# Patient Record
Sex: Male | Born: 1940 | Race: White | Hispanic: No | Smoking: Light tobacco smoker
Health system: Southern US, Community
[De-identification: ages and names within clinical notes are randomized; demographics above are authoritative.]

## PROBLEM LIST (undated history)

## (undated) DIAGNOSIS — K259 Gastric ulcer, unspecified as acute or chronic, without hemorrhage or perforation: Secondary | ICD-10-CM

## (undated) DIAGNOSIS — K219 Gastro-esophageal reflux disease without esophagitis: Secondary | ICD-10-CM

---

## 2020-06-27 ENCOUNTER — Emergency Department (HOSPITAL_COMMUNITY): Payer: Medicare Other

## 2020-06-27 ENCOUNTER — Other Ambulatory Visit: Payer: Self-pay

## 2020-06-27 ENCOUNTER — Inpatient Hospital Stay (HOSPITAL_COMMUNITY)
Admission: EM | Admit: 2020-06-27 | Discharge: 2020-07-12 | DRG: 326 | Disposition: A | Discharge status: Other Acute Inpatient Hospital | Payer: Medicare Other | Attending: Family Medicine | Admitting: Family Medicine

## 2020-06-27 DIAGNOSIS — I442 Atrioventricular block, complete: Secondary | ICD-10-CM

## 2020-06-27 DIAGNOSIS — Z20822 Contact with and (suspected) exposure to covid-19: Secondary | ICD-10-CM | POA: Diagnosis present

## 2020-06-27 DIAGNOSIS — B9681 Helicobacter pylori [H. pylori] as the cause of diseases classified elsewhere: Secondary | ICD-10-CM | POA: Diagnosis present

## 2020-06-27 DIAGNOSIS — E871 Hypo-osmolality and hyponatremia: Secondary | ICD-10-CM | POA: Diagnosis not present

## 2020-06-27 DIAGNOSIS — K3189 Other diseases of stomach and duodenum: Secondary | ICD-10-CM

## 2020-06-27 DIAGNOSIS — Z978 Presence of other specified devices: Secondary | ICD-10-CM

## 2020-06-27 DIAGNOSIS — T8143XA Infection following a procedure, organ and space surgical site, initial encounter: Secondary | ICD-10-CM

## 2020-06-27 DIAGNOSIS — K651 Peritoneal abscess: Secondary | ICD-10-CM | POA: Diagnosis not present

## 2020-06-27 DIAGNOSIS — I959 Hypotension, unspecified: Secondary | ICD-10-CM

## 2020-06-27 DIAGNOSIS — Z452 Encounter for adjustment and management of vascular access device: Secondary | ICD-10-CM

## 2020-06-27 DIAGNOSIS — J9601 Acute respiratory failure with hypoxia: Secondary | ICD-10-CM | POA: Diagnosis present

## 2020-06-27 DIAGNOSIS — I082 Rheumatic disorders of both aortic and tricuspid valves: Secondary | ICD-10-CM | POA: Diagnosis present

## 2020-06-27 DIAGNOSIS — R6521 Severe sepsis with septic shock: Secondary | ICD-10-CM | POA: Diagnosis not present

## 2020-06-27 DIAGNOSIS — N179 Acute kidney failure, unspecified: Secondary | ICD-10-CM

## 2020-06-27 DIAGNOSIS — E876 Hypokalemia: Secondary | ICD-10-CM | POA: Diagnosis not present

## 2020-06-27 DIAGNOSIS — R198 Other specified symptoms and signs involving the digestive system and abdomen: Secondary | ICD-10-CM

## 2020-06-27 DIAGNOSIS — E875 Hyperkalemia: Secondary | ICD-10-CM

## 2020-06-27 DIAGNOSIS — E869 Volume depletion, unspecified: Secondary | ICD-10-CM | POA: Diagnosis not present

## 2020-06-27 DIAGNOSIS — R0602 Shortness of breath: Secondary | ICD-10-CM

## 2020-06-27 DIAGNOSIS — J95821 Acute postprocedural respiratory failure: Secondary | ICD-10-CM | POA: Diagnosis not present

## 2020-06-27 DIAGNOSIS — K7200 Acute and subacute hepatic failure without coma: Secondary | ICD-10-CM | POA: Diagnosis not present

## 2020-06-27 DIAGNOSIS — K255 Chronic or unspecified gastric ulcer with perforation: Secondary | ICD-10-CM | POA: Diagnosis not present

## 2020-06-27 DIAGNOSIS — D631 Anemia in chronic kidney disease: Secondary | ICD-10-CM | POA: Diagnosis present

## 2020-06-27 DIAGNOSIS — I129 Hypertensive chronic kidney disease with stage 1 through stage 4 chronic kidney disease, or unspecified chronic kidney disease: Secondary | ICD-10-CM | POA: Diagnosis present

## 2020-06-27 DIAGNOSIS — B379 Candidiasis, unspecified: Secondary | ICD-10-CM | POA: Diagnosis present

## 2020-06-27 DIAGNOSIS — N184 Chronic kidney disease, stage 4 (severe): Secondary | ICD-10-CM | POA: Diagnosis present

## 2020-06-27 DIAGNOSIS — A419 Sepsis, unspecified organism: Secondary | ICD-10-CM | POA: Diagnosis not present

## 2020-06-27 DIAGNOSIS — F17200 Nicotine dependence, unspecified, uncomplicated: Secondary | ICD-10-CM | POA: Diagnosis present

## 2020-06-27 DIAGNOSIS — R188 Other ascites: Secondary | ICD-10-CM

## 2020-06-27 LAB — COMPREHENSIVE METABOLIC PANEL
ALT: 14 U/L (ref 0–44)
AST: 21 U/L (ref 15–41)
Albumin: 3.7 g/dL (ref 3.5–5.0)
Alkaline Phosphatase: 47 U/L (ref 38–126)
Anion gap: 13 (ref 5–15)
BUN: 59 mg/dL — ABNORMAL HIGH (ref 8–23)
CO2: 25 mmol/L (ref 22–32)
Calcium: 8.9 mg/dL (ref 8.9–10.3)
Chloride: 97 mmol/L — ABNORMAL LOW (ref 98–111)
Creatinine, Ser: 2.42 mg/dL — ABNORMAL HIGH (ref 0.61–1.24)
GFR, Estimated: 26 mL/min — ABNORMAL LOW (ref >60)
Glucose, Bld: 150 mg/dL — ABNORMAL HIGH (ref 70–99)
Potassium: 4.4 mmol/L (ref 3.5–5.1)
Sodium: 135 mmol/L (ref 135–145)
Total Bilirubin: 1.1 mg/dL (ref 0.3–1.2)
Total Protein: 7.2 g/dL (ref 6.5–8.1)

## 2020-06-27 LAB — CBC WITH DIFFERENTIAL/PLATELET
Abs Immature Granulocytes: 0.02 10*3/uL (ref 0.00–0.07)
Basophils Absolute: 0 10*3/uL (ref 0.0–0.1)
Basophils Relative: 0 %
Eosinophils Absolute: 0.1 10*3/uL (ref 0.0–0.5)
Eosinophils Relative: 1 %
HCT: 38.2 % — ABNORMAL LOW (ref 39.0–52.0)
Hemoglobin: 12.3 g/dL — ABNORMAL LOW (ref 13.0–17.0)
Immature Granulocytes: 0 %
Lymphocytes Relative: 6 %
Lymphs Abs: 0.6 10*3/uL — ABNORMAL LOW (ref 0.7–4.0)
MCH: 31.3 pg (ref 26.0–34.0)
MCHC: 32.2 g/dL (ref 30.0–36.0)
MCV: 97.2 fL (ref 80.0–100.0)
Monocytes Absolute: 0.4 10*3/uL (ref 0.1–1.0)
Monocytes Relative: 5 %
Neutro Abs: 7.7 10*3/uL (ref 1.7–7.7)
Neutrophils Relative %: 88 %
Platelets: 392 10*3/uL (ref 150–400)
RBC: 3.93 MIL/uL — ABNORMAL LOW (ref 4.22–5.81)
RDW: 14.6 % (ref 11.5–15.5)
WBC: 8.7 10*3/uL (ref 4.0–10.5)
nRBC: 0 % (ref 0.0–0.2)

## 2020-06-27 LAB — RESPIRATORY PANEL BY RT PCR (FLU A&B, COVID)
Influenza A by PCR: NEGATIVE
Influenza B by PCR: NEGATIVE
SARS Coronavirus 2 by RT PCR: NEGATIVE

## 2020-06-27 LAB — LIPASE, BLOOD: Lipase: 58 U/L — ABNORMAL HIGH (ref 11–51)

## 2020-06-27 MED ORDER — PIPERACILLIN-TAZOBACTAM 3.375 G IVPB 30 MIN
3.3750 g | Freq: Once | INTRAVENOUS | Status: AC
  Administered 2020-06-27: 3.375 g via INTRAVENOUS
  Filled 2020-06-27: qty 50

## 2020-06-27 MED ORDER — SODIUM CHLORIDE 0.9 % IV BOLUS
1000.0000 mL | Freq: Once | INTRAVENOUS | Status: AC
  Administered 2020-06-27: 1000 mL via INTRAVENOUS

## 2020-06-27 MED ORDER — CHLORHEXIDINE GLUCONATE CLOTH 2 % EX PADS
6.0000 | MEDICATED_PAD | Freq: Once | CUTANEOUS | Status: AC
  Administered 2020-06-28: 6 via TOPICAL

## 2020-06-27 MED ORDER — ONDANSETRON HCL 4 MG/2ML IJ SOLN
4.0000 mg | Freq: Once | INTRAMUSCULAR | Status: AC
  Administered 2020-06-27: 4 mg via INTRAVENOUS
  Filled 2020-06-27: qty 2

## 2020-06-27 MED ORDER — MORPHINE SULFATE (PF) 4 MG/ML IV SOLN
4.0000 mg | Freq: Once | INTRAVENOUS | Status: AC
  Administered 2020-06-27: 4 mg via INTRAVENOUS
  Filled 2020-06-27: qty 1

## 2020-06-27 MED ORDER — HYDROMORPHONE HCL 1 MG/ML IJ SOLN
1.0000 mg | Freq: Once | INTRAMUSCULAR | Status: AC
  Administered 2020-06-27: 1 mg via INTRAVENOUS
  Filled 2020-06-27: qty 1

## 2020-06-27 MED ORDER — SODIUM CHLORIDE 0.9 % IV SOLN
2.0000 g | INTRAVENOUS | Status: AC
  Administered 2020-06-28: 2 g via INTRAVENOUS
  Filled 2020-06-27: qty 2

## 2020-06-27 MED ORDER — SODIUM CHLORIDE 0.9 % IV BOLUS
500.0000 mL | Freq: Once | INTRAVENOUS | Status: AC
  Administered 2020-06-27: 500 mL via INTRAVENOUS

## 2020-06-27 NOTE — ED Provider Notes (Addendum)
[facility] EMERGENCY DEPARTMENT Provider Note   CSN: [id number] Arrival date & time: 06/27/20  1935     History Chief Complaint  Patient presents with  . Abdominal Pain    Frederick [name] is a 79 y.o. male.  Pt presents to the ED today with abdominal pain.  Pt said it started today after eating chocolate.  Pain comes in waves.  No other associated sx.  Pt has not seen a doctor in several years.        No past medical history on file.  There are no problems to display for this patient.      No family history on file.  Social History   Tobacco Use  . Smoking status: Not on file  Substance Use Topics  . Alcohol use: Not on file  . Drug use: Not on file    Home Medications Prior to Admission medications   Medication Sig Start Date End Date Taking? Authorizing Provider  acetaminophen (TYLENOL) 500 MG tablet Take 500 mg by mouth every 6 (six) hours as needed.   Yes [provider]    Allergies    Patient has no known allergies.  Review of Systems   Review of Systems  Gastrointestinal: Positive for abdominal pain.  All other systems reviewed and are negative.   Physical Exam Updated Vital Signs BP 99/65   Pulse 65   Temp 97.8 F (36.6 C) (Oral)   Resp 18   Ht 5\' 8"  (1.727 m)   Wt 63.5 kg   SpO2 96%   BMI 21.29 kg/m   Physical Exam Vitals and nursing note reviewed.  Constitutional:      Appearance: He is well-developed.  HENT:     Head: Normocephalic and atraumatic.     Mouth/Throat:     Mouth: Mucous membranes are moist.     Pharynx: Oropharynx is clear.  Eyes:     Extraocular Movements: Extraocular movements intact.     Pupils: Pupils are equal, round, and reactive to light.  Cardiovascular:     Rate and Rhythm: Normal rate and regular rhythm.  Pulmonary:     Effort: Pulmonary effort is normal.     Breath sounds: Normal breath sounds.  Abdominal:     General: Abdomen is flat. Bowel sounds are decreased.     Palpations: Abdomen  is soft.     Tenderness: There is generalized abdominal tenderness.  Skin:    General: Skin is warm.     Capillary Refill: Capillary refill takes less than 2 seconds.     Comments: Chronic skin changes BLE  Neurological:     General: No focal deficit present.     Mental Status: He is alert and oriented to person, place, and time.  Psychiatric:        Mood and Affect: Mood normal.        Behavior: Behavior normal.     ED Results / Procedures / Treatments   Labs (all labs ordered are listed, but only abnormal results are displayed) Labs Reviewed  CBC WITH DIFFERENTIAL/PLATELET - Abnormal; Notable for the following components:      Result Value   RBC 3.93 (*)    Hemoglobin 12.3 (*)    HCT 38.2 (*)    Lymphs Abs 0.6 (*)    All other components within normal limits  COMPREHENSIVE METABOLIC PANEL - Abnormal; Notable for the following components:   Chloride 97 (*)    Glucose, Bld 150 (*)    BUN 59 (*)  Creatinine, Ser 2.42 (*)    GFR, Estimated 26 (*)    All other components within normal limits  LIPASE, BLOOD - Abnormal; Notable for the following components:   Lipase 58 (*)    All other components within normal limits  RESPIRATORY PANEL BY RT PCR (FLU A&B, COVID)  URINALYSIS, ROUTINE W REFLEX MICROSCOPIC    EKG EKG Interpretation  Date/Time:  Monday June 27 2020 22:51:06 EDT Ventricular Rate:  74 PR Interval:    QRS Duration: 89 QT Interval:  423 QTC Calculation: 470 R Axis:   -43 Text Interpretation: Sinus or ectopic atrial rhythm Prolonged PR interval Inferior infarct, old Anterior infarct, old No old tracing to compare Confirmed by Isla Pence [phone]) on 06/27/2020 11:25:02 PM   Radiology CT ABDOMEN PELVIS WO CONTRAST  Result Date: 06/27/2020 CLINICAL DATA:  Abdominal pain EXAM: CT ABDOMEN AND PELVIS WITHOUT CONTRAST TECHNIQUE: Multidetector CT imaging of the abdomen and pelvis was performed following the standard protocol without IV contrast.  COMPARISON:  None. FINDINGS: Lower chest: Lung bases demonstrate mild emphysematous disease. No acute consolidation or effusion. Minimal subpleural fibrosis. Coronary vascular calcification. Hepatobiliary: No focal liver abnormality is seen. No gallstones, gallbladder wall thickening, or biliary dilatation. Pancreas: Unremarkable. No pancreatic ductal dilatation or surrounding inflammatory changes. Spleen: Normal in size without focal abnormality. Adrenals/Urinary Tract: Adrenal glands are normal. Kidneys show no hydronephrosis. Multiple low-attenuation lesions within both kidneys favored to represent cysts though incompletely characterized without contrast. Slightly thick-walled urinary bladder. Stomach/Bowel: Wall thickening at the pre pyloric region of the stomach with suspected focal defect/perforation in the wall, coronal series 5, image number 23. No small bowel dilatation. Moderate stool in the colon. Negative appendix. 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 Vascular/Lymphatic: Moderate aortic atherosclerosis without aneurysm. Ectatic common iliac vessels measuring 1.6 cm on the right and 1.8 cm on the left. No suspicious adenopathy. Reproductive: Slightly enlarged prostate with mass effect on the bladder Other: Moderate free air within the upper abdomen. Small amount of ascites within the abdomen and pelvis. Musculoskeletal: No acute or significant osseous findings. IMPRESSION: 1. Moderate free air in the upper abdomen with small amount of free fluid in the abdomen and pelvis consistent with hollow viscus perforation. Suspected source is the pre-pyloric region of the stomach where there is wall thickening and apparent defect, either representing perforated ulcer or perforated gastric mass. 2. 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 Slightly enlarged prostate. Bladder slightly thick walled which may be due to cystitis or chronic obstruction. Critical  Value/emergent results were called by telephone at the time of interpretation on 06/27/2020 at 9:44 pm to provider Yair Dusza , who verbally acknowledged these results. Aortic Atherosclerosis (ICD10-I70.0). Electronically Signed   By: Donavan Foil M.D.   On: 06/27/2020 21:44   DG Chest Portable 1 View  Result Date: 06/27/2020 CLINICAL DATA:  Abdomen pain EXAM: PORTABLE CHEST 1 VIEW COMPARISON:  CT 06/27/2020 FINDINGS: The free air noted on CT is not well seen radiographically. No focal consolidation or effusion. Normal cardiomediastinal silhouette with aortic atherosclerosis. No pneumothorax. IMPRESSION: No active disease. The free air noted on CT is not well seen radiographically. Electronically Signed   By: Donavan Foil M.D.   On: 06/27/2020 22:44    Procedures Procedures (including critical care time)  Medications Ordered in ED Medications  Chlorhexidine Gluconate Cloth 2 % PADS 6 each (has no administration in time range)  cefoTEtan (CEFOTAN) 2 g in sodium chloride 0.9 % 100 mL IVPB (has  no administration in time range)  sodium chloride 0.9 % bolus 500 mL (0 mLs Intravenous Stopped 06/27/20 2150)  morphine 4 MG/ML injection 4 mg (4 mg Intravenous Given 06/27/20 2013)  ondansetron (ZOFRAN) injection 4 mg (4 mg Intravenous Given 06/27/20 2013)  HYDROmorphone (DILAUDID) injection 1 mg (1 mg Intravenous Given 06/27/20 2103)  piperacillin-tazobactam (ZOSYN) IVPB 3.375 g (0 g Intravenous Stopped 06/27/20 2305)  sodium chloride 0.9 % bolus 1,000 mL (1,000 mLs Intravenous New Bag/Given 06/27/20 2305)    ED Course  I have reviewed the triage vital signs and the nursing notes.  Pertinent labs & imaging results that were available during my care of the patient were reviewed by me and considered in my medical decision making (see chart for details).    MDM Rules/Calculators/A&P                          Pt given a dose of Zosyn IV.  He was d/w Dr. Constance Haw (surgery) who will take patient  to the OR.  Covid negative.  She requested a NG to be placed, but pt unable to tolerate this procedure.  CRITICAL CARE Performed by: Isla Pence   Total critical care time: 30 minutes  Critical care time was exclusive of separately billable procedures and treating other patients.  Critical care was necessary to treat or prevent imminent or life-threatening deterioration.  Critical care was time spent personally by me on the following activities: development of treatment plan with patient and/or surrogate as well as nursing, discussions with consultants, evaluation of patient's response to treatment, examination of patient, obtaining history from patient or surrogate, ordering and performing treatments and interventions, ordering and review of laboratory studies, ordering and review of radiographic studies, pulse oximetry and re-evaluation of patient's condition.  Final Clinical Impression(s) / ED Diagnoses Final diagnoses:  Perforated abdominal viscus  AKI (acute kidney injury) [facility])    Rx / Lewis Orders ED Discharge Orders    None       Isla Pence, MD 06/27/20 5093    Isla Pence, MD 06/27/20 2325

## 2020-06-27 NOTE — Progress Notes (Addendum)
[facility]  Patient with perforated peptic ulcer and tenderness on exam and requiring pain medication per Dr. Gilford Raid. Hemodynamically has been stable. Does not see PCP, so has no medical issues documented. Renal insufficiency.   Received 1000L total so far.   Will need to go back to surgery given continued pain and tenderness. No beds / RN Staff to admit at [facility]. AC is working to see other bed options. Would have to stay in PACU and RN on Call in PACU has to work in AM .  Asked Dr. Gilford Raid to give another 1L bolus. NG placement. Zosyn for perforation.  AC as found out that there is a bed at [facility] and I called and Spoke with Dr. Johney Maine who is on call there. He stated that [facility] takes [facility] transfers but unsure of the beds. AC said [facility] was holding 4 earlier.   Will proceed with surgery and sort out bed situation after as no great options available at this time.    Curlene Labrum, MD [facility] [address] [place], [place] [address] [phone] (office)

## 2020-06-27 NOTE — ED Notes (Signed)
Attempted NG placement x2, pt unable to tolerate & will not follow directions, MD made aware

## 2020-06-27 NOTE — H&P (Signed)
[facility] History and Physical  Reason for Referral: Perforated peptic ulcer   Referring Physician: Dr. Gilford Raid   Chief Complaint    Abdominal Pain      Frederick [name] is a 79 y.o. male.  HPI: Frederick [name] is an 79 yo who does not seek medical attention and has no PCP and takes no medications. He came in with acute onset of abdominal pain in his epigastric area after eating chocolate earlier today. The pain comes in waves and is not associated with any vomiting.   No prior colonoscopy and no reported NSAID/ BC powder use.   History reviewed. No pertinent past medical history.  History reviewed. No pertinent surgical history.  History reviewed. No pertinent family history.  Social History   Tobacco Use   Smoking status: Light Tobacco Smoker  Substance Use Topics   Alcohol use: Not on file   Drug use: Not on file    Medications: I have revie the patient's current medications. Current Facility-Administered Medications  Medication Dose Route Frequency Provider Last Rate Last Admin   cefoTEtan (CEFOTAN) 2 g in sodium chloride 0.9 % 100 mL IVPB  2 g Intravenous On Call to OR Virl Cagey, MD       Current Outpatient Medications  Medication Sig Dispense Refill Last Dose   acetaminophen (TYLENOL) 500 MG tablet Take 500 mg by mouth every 6 (six) hours as needed.   06/27/2020 at Unknown time   No Known Allergies    ROS:  A comprehensive review of systems was negative except for: Gastrointestinal: positive for abdominal pain  Blood pressure 128/84, pulse (!) 56, temperature 97.8 F (36.6 C), temperature source Oral, resp. rate 18, height 5\' 8"  (1.727 m), weight 63.5 kg, SpO2 94 %. Physical Exam Vitals reviewed.  Constitutional:      General: He is in acute distress.     Appearance: He is underweight.  HENT:     Head: Normocephalic.  Eyes:     Extraocular Movements: Extraocular movements intact.  Cardiovascular:     Rate and Rhythm: Normal  rate.  Pulmonary:     Effort: Pulmonary effort is normal.  Abdominal:     General: There is distension.     Palpations: Abdomen is soft.     Tenderness: There is abdominal tenderness.     Hernia: No hernia is present.  Musculoskeletal:     Comments: Moves all extremities   Skin:    General: Skin is warm.  Neurological:     General: No focal deficit present.     Mental Status: He is alert and oriented to person, place, and time.  Psychiatric:        Mood and Affect: Mood normal.        Behavior: Behavior normal.     Results: Results for orders placed or performed during the hospital encounter of 06/27/20 (from the past 48 hour(s))  CBC with Differential     Status: Abnormal   Collection Time: 06/27/20  8:09 PM  Result Value Ref Range   WBC 8.7 4.0 - 10.5 K/uL   RBC 3.93 (L) 4.22 - 5.81 MIL/uL   Hemoglobin 12.3 (L) 13.0 - 17.0 g/dL   HCT 38.2 (L) 39 - 52 %   MCV 97.2 80.0 - 100.0 fL   MCH 31.3 26.0 - 34.0 pg   MCHC 32.2 30.0 - 36.0 g/dL   RDW 14.6 11.5 - 15.5 %   Platelets 392 150 - 400 K/uL   nRBC 0.0  0.0 - 0.2 %   Neutrophils Relative % 88 %   Neutro Abs 7.7 1.7 - 7.7 K/uL   Lymphocytes Relative 6 %   Lymphs Abs 0.6 (L) 0.7 - 4.0 K/uL   Monocytes Relative 5 %   Monocytes Absolute 0.4 0.1 - 1.0 K/uL   Eosinophils Relative 1 %   Eosinophils Absolute 0.1 0.0 - 0.5 K/uL   Basophils Relative 0 %   Basophils Absolute 0.0 0.0 - 0.1 K/uL   Immature Granulocytes 0 %   Abs Immature Granulocytes 0.02 0.00 - 0.07 K/uL    Comment: Performed at [facility], [address] [place], [place] [address]  Comprehensive metabolic panel     Status: Abnormal   Collection Time: 06/27/20  8:09 PM  Result Value Ref Range   Sodium 135 135 - 145 mmol/L   Potassium 4.4 3.5 - 5.1 mmol/L   Chloride 97 (L) 98 - 111 mmol/L   CO2 25 22 - 32 mmol/L   Glucose, Bld 150 (H) 70 - 99 mg/dL    Comment: Glucose reference range applies only to samples taken after fasting for at least 8 hours.   BUN  59 (H) 8 - 23 mg/dL   Creatinine, Ser 2.42 (H) 0.61 - 1.24 mg/dL   Calcium 8.9 8.9 - 10.3 mg/dL   Total Protein 7.2 6.5 - 8.1 g/dL   Albumin 3.7 3.5 - 5.0 g/dL   AST 21 15 - 41 U/L   ALT 14 0 - 44 U/L   Alkaline Phosphatase 47 38 - 126 U/L   Total Bilirubin 1.1 0.3 - 1.2 mg/dL   GFR, Estimated 26 (L) >60 mL/min    Comment: (NOTE) Calculated using the CKD-EPI Creatinine Equation (2021)    Anion gap 13 5 - 15    Comment: Performed at [facility], [address], [place], [place] [address]  Lipase, blood     Status: Abnormal   Collection Time: 06/27/20  8:09 PM  Result Value Ref Range   Lipase 58 (H) 11 - 51 U/L    Comment: Performed at [facility], [address], [place], [place] [address]  Prepare RBC (crossmatch)     Status: None   Collection Time: 06/27/20  8:09 PM  Result Value Ref Range   Order Confirmation      ORDER PROCESSED BY BLOOD BANK Performed at [facility], [address], [place], [place] [address]   Type and screen [facility]     Status: None (Preliminary result)   Collection Time: 06/27/20  8:09 PM  Result Value Ref Range   ABO/RH(D) A POS    Antibody Screen PENDING    Sample Expiration      06/30/2020,2359 Performed at [facility], [address] [place], [place] [address]   ABO/Rh     Status: None   Collection Time: 06/27/20  8:09 PM  Result Value Ref Range   ABO/RH(D)      A POS Performed at [facility], [address] [place], [place] [address]   Respiratory Panel by RT PCR (Flu A&B, Covid) - Nasopharyngeal Swab     Status: None   Collection Time: 06/27/20  9:48 PM   Specimen: Nasopharyngeal Swab  Result Value Ref Range   SARS Coronavirus 2 by RT PCR NEGATIVE NEGATIVE    Comment: (NOTE) SARS-CoV-2 target nucleic acids are NOT DETECTED.  The SARS-CoV-2 RNA is generally detectable in upper respiratoy specimens during the acute phase of infection. The lowest concentration of SARS-CoV-2  viral copies this assay can detect is 131  copies/mL. A negative result does not preclude SARS-Cov-2 infection and should not be used as the sole basis for treatment or other patient management decisions. A negative result may occur with  improper specimen collection/handling, submission of specimen other than nasopharyngeal swab, presence of viral mutation(s) within the areas targeted by this assay, and inadequate number of viral copies (<131 copies/mL). A negative result must be combined with clinical observations, patient history, and epidemiological information. The expected result is Negative.  Fact Sheet for Patients:  PinkCheek.be  Fact Sheet for Healthcare Providers:  GravelBags.it  This test is no t yet approved or cleared by the [place] FDA and  has been authorized for detection and/or diagnosis of SARS-CoV-2 by FDA under an Emergency Use Authorization (EUA). This EUA will remain  in effect (meaning this test can be used) for the duration of the COVID-19 declaration under Section 564(b)(1) of the Act, 21 U.S.C. section 360bbb-3(b)(1), unless the authorization is terminated or revoked sooner.     Influenza A by PCR NEGATIVE NEGATIVE   Influenza B by PCR NEGATIVE NEGATIVE    Comment: (NOTE) The Xpert Xpress SARS-CoV-2/FLU/RSV assay is intended as an aid in  the diagnosis of influenza from Nasopharyngeal swab specimens and  should not be used as a sole basis for treatment. Nasal washings and  aspirates are unacceptable for Xpert Xpress SARS-CoV-2/FLU/RSV  testing.  Fact Sheet for Patients: PinkCheek.be  Fact Sheet for Healthcare Providers: GravelBags.it  This test is not yet approved or cleared by the [place] FDA and  has been authorized for detection and/or diagnosis of SARS-CoV-2 by  FDA under an Emergency Use Authorization (EUA). This EUA will remain  in effect (meaning this test  can be used) for the duration of the  Covid-19 declaration under Section 564(b)(1) of the Act, 21  U.S.C. section 360bbb-3(b)(1), unless the authorization is  terminated or revoked. Performed at [facility], [address], [place], [place] [address]    Personally reviewed- free air around stomach, thickened stomach, coronal with possible area of perforation on anterior stomach  CT ABDOMEN PELVIS WO CONTRAST  Result Date: 06/27/2020 CLINICAL DATA:  Abdominal pain EXAM: CT ABDOMEN AND PELVIS WITHOUT CONTRAST TECHNIQUE: Multidetector CT imaging of the abdomen and pelvis was performed following the standard protocol without IV contrast. COMPARISON:  None. FINDINGS: Lower chest: Lung bases demonstrate mild emphysematous disease. No acute consolidation or effusion. Minimal subpleural fibrosis. Coronary vascular calcification. Hepatobiliary: No focal liver abnormality is seen. No gallstones, gallbladder wall thickening, or biliary dilatation. Pancreas: Unremarkable. No pancreatic ductal dilatation or surrounding inflammatory changes. Spleen: Normal in size without focal abnormality. Adrenals/Urinary Tract: Adrenal glands are normal. Kidneys show no hydronephrosis. Multiple low-attenuation lesions within both kidneys favored to represent cysts though incompletely characterized without contrast. Slightly thick-walled urinary bladder. Stomach/Bowel: Wall thickening at the pre pyloric region of the stomach with suspected focal defect/perforation in the wall, coronal series 5, image number 23. No small bowel dilatation. Moderate stool in the colon. Negative appendix. 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 Vascular/Lymphatic: Moderate aortic atherosclerosis without aneurysm. Ectatic common iliac vessels measuring 1.6 cm on the right and 1.8 cm on the left. No suspicious adenopathy. Reproductive: Slightly enlarged prostate with mass effect on the bladder Other: Moderate free air within the  upper abdomen. Small amount of ascites within the abdomen and pelvis. Musculoskeletal: No acute or significant osseous findings. IMPRESSION: 1. Moderate free air in the upper abdomen with  small amount of free fluid in the abdomen and pelvis consistent with hollow viscus perforation. Suspected source is the pre-pyloric region of the stomach where there is wall thickening and apparent defect, either representing perforated ulcer or perforated gastric mass. 2. 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 Slightly enlarged prostate. Bladder slightly thick walled which may be due to cystitis or chronic obstruction. Critical Value/emergent results were called by telephone at the time of interpretation on 06/27/2020 at 9:44 pm to provider JULIE HAVILAND , who verbally acknowledged these results. Aortic Atherosclerosis (ICD10-I70.0). Electronically Signed   By: Donavan Foil M.D.   On: 06/27/2020 21:44   DG Chest Portable 1 View  Result Date: 06/27/2020 CLINICAL DATA:  Abdomen pain EXAM: PORTABLE CHEST 1 VIEW COMPARISON:  CT 06/27/2020 FINDINGS: The free air noted on CT is not well seen radiographically. No focal consolidation or effusion. Normal cardiomediastinal silhouette with aortic atherosclerosis. No pneumothorax. IMPRESSION: No active disease. The free air noted on CT is not well seen radiographically. Electronically Signed   By: Donavan Foil M.D.   On: 06/27/2020 22:44     Assessment & Plan:  Frederick [name] is a 79 y.o. male with perforated bowel likely from gastric perforation from ulcer versus mass. Discussed with patient exploration and possible bowel resection or graham patch. Discussed risk of bleeding, infection, finding cancer. Discussed prolonged ICU course, intubation and need for family to make decision. He has 7 daughters were out of state, and says that his neighbor Frederick [name] is his contact, [phone]. She has his daughters information.   COVID negative OR for  exploration Called and spoke with Suanne Marker to notify her of OR and to explain that patient has potential to get very sick and need prolonged ICU stay. Suanne Marker says he had pain in the last few weeks and that he was having worsening pain today. Suanne Marker says that he has a girlfriend and gets meals on wheels. She has not really noticed any major weight loss recently.    All questions were answered to the satisfaction of the patient.  Virl Cagey 06/28/2020, 12:59 AM

## 2020-06-27 NOTE — ED Triage Notes (Signed)
Pt come sin from home via [facility] for complaints of abdominal pain that started after eating 2 pieces of chocolate. Pt complains of generalized abdominal pain "that comes in waves."  Does not have a PCP and does not go to the hospital. So he is unsure of any medical problems he may have.  He also has cellulitis in each lower leg that has been untreated per EMS.   EMS gave 161mcg Fentanyl  18g R AC

## 2020-06-28 ENCOUNTER — Inpatient Hospital Stay (HOSPITAL_COMMUNITY): Payer: Medicare Other

## 2020-06-28 ENCOUNTER — Emergency Department (HOSPITAL_COMMUNITY): Payer: Medicare Other | Admitting: Anesthesiology

## 2020-06-28 ENCOUNTER — Encounter (HOSPITAL_COMMUNITY)
Admission: EM | Disposition: A | Discharge status: Nursing Home (Includes ICF) | Payer: Self-pay | Source: Home / Self Care | Attending: Family Medicine

## 2020-06-28 ENCOUNTER — Encounter (HOSPITAL_COMMUNITY): Payer: Self-pay | Admitting: General Surgery

## 2020-06-28 DIAGNOSIS — R198 Other specified symptoms and signs involving the digestive system and abdomen: Secondary | ICD-10-CM | POA: Diagnosis not present

## 2020-06-28 DIAGNOSIS — Z20822 Contact with and (suspected) exposure to covid-19: Secondary | ICD-10-CM | POA: Diagnosis present

## 2020-06-28 DIAGNOSIS — F17200 Nicotine dependence, unspecified, uncomplicated: Secondary | ICD-10-CM | POA: Diagnosis present

## 2020-06-28 DIAGNOSIS — I442 Atrioventricular block, complete: Secondary | ICD-10-CM | POA: Diagnosis not present

## 2020-06-28 DIAGNOSIS — K651 Peritoneal abscess: Secondary | ICD-10-CM | POA: Diagnosis not present

## 2020-06-28 DIAGNOSIS — K7200 Acute and subacute hepatic failure without coma: Secondary | ICD-10-CM | POA: Diagnosis not present

## 2020-06-28 DIAGNOSIS — E875 Hyperkalemia: Secondary | ICD-10-CM | POA: Diagnosis not present

## 2020-06-28 DIAGNOSIS — D631 Anemia in chronic kidney disease: Secondary | ICD-10-CM | POA: Diagnosis present

## 2020-06-28 DIAGNOSIS — N179 Acute kidney failure, unspecified: Secondary | ICD-10-CM

## 2020-06-28 DIAGNOSIS — I129 Hypertensive chronic kidney disease with stage 1 through stage 4 chronic kidney disease, or unspecified chronic kidney disease: Secondary | ICD-10-CM | POA: Diagnosis present

## 2020-06-28 DIAGNOSIS — K255 Chronic or unspecified gastric ulcer with perforation: Secondary | ICD-10-CM | POA: Diagnosis present

## 2020-06-28 DIAGNOSIS — A419 Sepsis, unspecified organism: Secondary | ICD-10-CM | POA: Diagnosis not present

## 2020-06-28 DIAGNOSIS — E871 Hypo-osmolality and hyponatremia: Secondary | ICD-10-CM | POA: Diagnosis not present

## 2020-06-28 DIAGNOSIS — J95821 Acute postprocedural respiratory failure: Secondary | ICD-10-CM | POA: Diagnosis not present

## 2020-06-28 DIAGNOSIS — K3189 Other diseases of stomach and duodenum: Secondary | ICD-10-CM

## 2020-06-28 DIAGNOSIS — B9681 Helicobacter pylori [H. pylori] as the cause of diseases classified elsewhere: Secondary | ICD-10-CM | POA: Diagnosis present

## 2020-06-28 DIAGNOSIS — N184 Chronic kidney disease, stage 4 (severe): Secondary | ICD-10-CM | POA: Diagnosis present

## 2020-06-28 DIAGNOSIS — R6521 Severe sepsis with septic shock: Secondary | ICD-10-CM | POA: Diagnosis not present

## 2020-06-28 DIAGNOSIS — I959 Hypotension, unspecified: Secondary | ICD-10-CM

## 2020-06-28 DIAGNOSIS — N19 Unspecified kidney failure: Secondary | ICD-10-CM

## 2020-06-28 DIAGNOSIS — R0600 Dyspnea, unspecified: Secondary | ICD-10-CM | POA: Diagnosis not present

## 2020-06-28 DIAGNOSIS — E869 Volume depletion, unspecified: Secondary | ICD-10-CM | POA: Diagnosis not present

## 2020-06-28 DIAGNOSIS — B379 Candidiasis, unspecified: Secondary | ICD-10-CM | POA: Diagnosis present

## 2020-06-28 DIAGNOSIS — I082 Rheumatic disorders of both aortic and tricuspid valves: Secondary | ICD-10-CM | POA: Diagnosis present

## 2020-06-28 DIAGNOSIS — E876 Hypokalemia: Secondary | ICD-10-CM | POA: Diagnosis not present

## 2020-06-28 DIAGNOSIS — J9601 Acute respiratory failure with hypoxia: Secondary | ICD-10-CM | POA: Diagnosis present

## 2020-06-28 HISTORY — PX: LAPAROTOMY: SHX154

## 2020-06-28 LAB — BASIC METABOLIC PANEL
Anion gap: 9 (ref 5–15)
Anion gap: 5 (ref 5–15)
Anion gap: 13 (ref 5–15)
BUN: 56 mg/dL — ABNORMAL HIGH (ref 8–23)
BUN: 57 mg/dL — ABNORMAL HIGH (ref 8–23)
BUN: 58 mg/dL — ABNORMAL HIGH (ref 8–23)
CO2: 21 mmol/L — ABNORMAL LOW (ref 22–32)
CO2: 21 mmol/L — ABNORMAL LOW (ref 22–32)
CO2: 21 mmol/L — ABNORMAL LOW (ref 22–32)
Calcium: 7.7 mg/dL — ABNORMAL LOW (ref 8.9–10.3)
Calcium: 7.2 mg/dL — ABNORMAL LOW (ref 8.9–10.3)
Calcium: 7.7 mg/dL — ABNORMAL LOW (ref 8.9–10.3)
Chloride: 104 mmol/L (ref 98–111)
Chloride: 106 mmol/L (ref 98–111)
Chloride: 101 mmol/L (ref 98–111)
Creatinine, Ser: 2.52 mg/dL — ABNORMAL HIGH (ref 0.61–1.24)
Creatinine, Ser: 2.72 mg/dL — ABNORMAL HIGH (ref 0.61–1.24)
Creatinine, Ser: 2.99 mg/dL — ABNORMAL HIGH (ref 0.61–1.24)
GFR, Estimated: 25 mL/min — ABNORMAL LOW (ref >60)
GFR, Estimated: 23 mL/min — ABNORMAL LOW (ref >60)
GFR, Estimated: 20 mL/min — ABNORMAL LOW (ref >60)
Glucose, Bld: 107 mg/dL — ABNORMAL HIGH (ref 70–99)
Glucose, Bld: 106 mg/dL — ABNORMAL HIGH (ref 70–99)
Glucose, Bld: 96 mg/dL (ref 70–99)
Potassium: 5.2 mmol/L — ABNORMAL HIGH (ref 3.5–5.1)
Potassium: 5.3 mmol/L — ABNORMAL HIGH (ref 3.5–5.1)
Potassium: 5.6 mmol/L — ABNORMAL HIGH (ref 3.5–5.1)
Sodium: 134 mmol/L — ABNORMAL LOW (ref 135–145)
Sodium: 132 mmol/L — ABNORMAL LOW (ref 135–145)
Sodium: 135 mmol/L (ref 135–145)

## 2020-06-28 LAB — URINALYSIS, COMPLETE (UACMP) WITH MICROSCOPIC
Bilirubin Urine: NEGATIVE
Glucose, UA: NEGATIVE mg/dL
Hgb urine dipstick: NEGATIVE
Ketones, ur: NEGATIVE mg/dL
Leukocytes,Ua: NEGATIVE
Nitrite: NEGATIVE
Protein, ur: 30 mg/dL — AB
Specific Gravity, Urine: 1.025 (ref 1.005–1.030)
pH: 5 (ref 5.0–8.0)

## 2020-06-28 LAB — BLOOD GAS, ARTERIAL
Acid-base deficit: 2.4 mmol/L — ABNORMAL HIGH (ref 0.0–2.0)
Bicarbonate: 22 mmol/L (ref 20.0–28.0)
FIO2: 50
O2 Saturation: 98.7 %
Patient temperature: 37
pCO2 arterial: 47.1 mmHg (ref 32.0–48.0)
pH, Arterial: 7.308 — ABNORMAL LOW (ref 7.350–7.450)
pO2, Arterial: 191 mmHg — ABNORMAL HIGH (ref 83.0–108.0)

## 2020-06-28 LAB — ABO/RH: ABO/RH(D): A POS

## 2020-06-28 LAB — PREPARE RBC (CROSSMATCH)

## 2020-06-28 LAB — CBC WITH DIFFERENTIAL/PLATELET
Band Neutrophils: 12 %
Basophils Absolute: 0 10*3/uL (ref 0.0–0.1)
Basophils Relative: 0 %
Eosinophils Absolute: 0 10*3/uL (ref 0.0–0.5)
Eosinophils Relative: 0 %
HCT: 38.2 % — ABNORMAL LOW (ref 39.0–52.0)
Hemoglobin: 11.6 g/dL — ABNORMAL LOW (ref 13.0–17.0)
Lymphocytes Relative: 15 %
Lymphs Abs: 0.6 10*3/uL — ABNORMAL LOW (ref 0.7–4.0)
MCH: 31.4 pg (ref 26.0–34.0)
MCHC: 30.4 g/dL (ref 30.0–36.0)
MCV: 103.5 fL — ABNORMAL HIGH (ref 80.0–100.0)
Metamyelocytes Relative: 19 %
Monocytes Absolute: 0.1 10*3/uL (ref 0.1–1.0)
Monocytes Relative: 2 %
Myelocytes: 4 %
Neutro Abs: 2.5 10*3/uL (ref 1.7–7.7)
Neutrophils Relative %: 47 %
Platelets: 316 10*3/uL (ref 150–400)
Promyelocytes Relative: 1 %
RBC: 3.69 MIL/uL — ABNORMAL LOW (ref 4.22–5.81)
RDW: 14.8 % (ref 11.5–15.5)
WBC: 4.3 10*3/uL (ref 4.0–10.5)
nRBC: 0 % (ref 0.0–0.2)

## 2020-06-28 LAB — GLUCOSE, CAPILLARY
Glucose-Capillary: 89 mg/dL (ref 70–99)
Glucose-Capillary: 110 mg/dL — ABNORMAL HIGH (ref 70–99)
Glucose-Capillary: 90 mg/dL (ref 70–99)
Glucose-Capillary: 97 mg/dL (ref 70–99)
Glucose-Capillary: 84 mg/dL (ref 70–99)

## 2020-06-28 LAB — T4, FREE: Free T4: 0.96 ng/dL (ref 0.61–1.12)

## 2020-06-28 LAB — LACTIC ACID, PLASMA
Lactic Acid, Venous: 3.1 mmol/L (ref 0.5–1.9)
Lactic Acid, Venous: 2.3 mmol/L (ref 0.5–1.9)

## 2020-06-28 LAB — TROPONIN I (HIGH SENSITIVITY)
Troponin I (High Sensitivity): 18 ng/L — ABNORMAL HIGH
Troponin I (High Sensitivity): 21 ng/L — ABNORMAL HIGH (ref <18)

## 2020-06-28 LAB — PHOSPHORUS: Phosphorus: 5.1 mg/dL — ABNORMAL HIGH (ref 2.5–4.6)

## 2020-06-28 LAB — TSH: TSH: 3.572 u[IU]/mL (ref 0.350–4.500)

## 2020-06-28 LAB — MAGNESIUM: Magnesium: 2.5 mg/dL — ABNORMAL HIGH (ref 1.7–2.4)

## 2020-06-28 LAB — TRIGLYCERIDES: Triglycerides: 48 mg/dL (ref <150)

## 2020-06-28 LAB — PROCALCITONIN: Procalcitonin: 58 ng/mL

## 2020-06-28 SURGERY — LAPAROTOMY, EXPLORATORY
Anesthesia: General

## 2020-06-28 MED ORDER — DEXTROSE 50 % IV SOLN
1.0000 | Freq: Once | INTRAVENOUS | Status: AC
  Administered 2020-06-28: 50 mL via INTRAVENOUS
  Filled 2020-06-28: qty 50

## 2020-06-28 MED ORDER — SODIUM CHLORIDE FLUSH 0.9 % IV SOLN
INTRAVENOUS | Status: AC
  Filled 2020-06-28: qty 10

## 2020-06-28 MED ORDER — HYDROMORPHONE HCL 1 MG/ML IJ SOLN: 0.2500 mg | INTRAMUSCULAR | Status: DC | PRN

## 2020-06-28 MED ORDER — PIPERACILLIN-TAZOBACTAM 3.375 G IVPB
3.3750 g | Freq: Two times a day (BID) | INTRAVENOUS | Status: DC
  Administered 2020-06-28: 3.375 g via INTRAVENOUS
  Filled 2020-06-28: qty 50

## 2020-06-28 MED ORDER — CALCIUM GLUCONATE-NACL 1-0.675 GM/50ML-% IV SOLN
1.0000 g | Freq: Once | INTRAVENOUS | Status: AC
  Administered 2020-06-28: 1000 mg via INTRAVENOUS
  Filled 2020-06-28: qty 50

## 2020-06-28 MED ORDER — PROPOFOL 10 MG/ML IV BOLUS
INTRAVENOUS | Status: DC | PRN
  Administered 2020-06-28: 80 mg via INTRAVENOUS

## 2020-06-28 MED ORDER — SODIUM BICARBONATE 8.4 % IV SOLN
100.0000 meq | Freq: Once | INTRAVENOUS | Status: AC
  Administered 2020-06-28: 100 meq via INTRAVENOUS

## 2020-06-28 MED ORDER — SODIUM CHLORIDE 0.9 % IV SOLN: INTRAVENOUS | Status: DC

## 2020-06-28 MED ORDER — DIPHENHYDRAMINE HCL 50 MG/ML IJ SOLN
12.5000 mg | Freq: Four times a day (QID) | INTRAMUSCULAR | Status: DC | PRN
  Administered 2020-06-30: 12.5 mg via INTRAVENOUS
  Filled 2020-06-28: qty 1

## 2020-06-28 MED ORDER — INSULIN ASPART 100 UNIT/ML IV SOLN
10.0000 [IU] | Freq: Once | INTRAVENOUS | Status: AC
  Administered 2020-06-28: 10 [IU] via INTRAVENOUS

## 2020-06-28 MED ORDER — PROPOFOL 10 MG/ML IV BOLUS
INTRAVENOUS | Status: AC
  Filled 2020-06-28: qty 20

## 2020-06-28 MED ORDER — CHLORHEXIDINE GLUCONATE 0.12% ORAL RINSE (MEDLINE KIT)
15.0000 mL | Freq: Two times a day (BID) | OROMUCOSAL | Status: DC
  Administered 2020-06-28 – 2020-07-12 (×19): 15 mL via OROMUCOSAL

## 2020-06-28 MED ORDER — SODIUM BICARBONATE 8.4 % IV SOLN
INTRAVENOUS | Status: AC
  Filled 2020-06-28: qty 50

## 2020-06-28 MED ORDER — ONDANSETRON HCL 4 MG/2ML IJ SOLN: 4.0000 mg | Freq: Four times a day (QID) | INTRAMUSCULAR | Status: DC | PRN

## 2020-06-28 MED ORDER — LACTATED RINGERS IV SOLN: Freq: Once | INTRAVENOUS | Status: AC

## 2020-06-28 MED ORDER — FENTANYL CITRATE (PF) 250 MCG/5ML IJ SOLN
INTRAMUSCULAR | Status: AC
  Filled 2020-06-28: qty 5

## 2020-06-28 MED ORDER — ORAL CARE MOUTH RINSE
15.0000 mL | OROMUCOSAL | Status: DC
  Administered 2020-06-28 (×5): 15 mL via OROMUCOSAL

## 2020-06-28 MED ORDER — ONDANSETRON HCL 4 MG/2ML IJ SOLN: 4.0000 mg | Freq: Once | INTRAMUSCULAR | Status: DC | PRN

## 2020-06-28 MED ORDER — METOCLOPRAMIDE HCL 5 MG/ML IJ SOLN
INTRAMUSCULAR | Status: AC
  Filled 2020-06-28: qty 2

## 2020-06-28 MED ORDER — PIPERACILLIN-TAZOBACTAM 3.375 G IVPB
3.3750 g | Freq: Three times a day (TID) | INTRAVENOUS | Status: DC
  Administered 2020-06-28 (×2): 3.375 g via INTRAVENOUS
  Filled 2020-06-28 (×2): qty 50

## 2020-06-28 MED ORDER — HEPARIN SODIUM (PORCINE) 5000 UNIT/ML IJ SOLN
5000.0000 [IU] | Freq: Three times a day (TID) | INTRAMUSCULAR | Status: AC
  Administered 2020-06-28 – 2020-07-06 (×25): 5000 [IU] via SUBCUTANEOUS
  Filled 2020-06-28 (×26): qty 1

## 2020-06-28 MED ORDER — LACTATED RINGERS IV SOLN: INTRAVENOUS | Status: DC | PRN

## 2020-06-28 MED ORDER — ORAL CARE MOUTH RINSE: 15.0000 mL | Freq: Once | OROMUCOSAL | Status: DC

## 2020-06-28 MED ORDER — HEPARIN SODIUM (PORCINE) 5000 UNIT/ML IJ SOLN: 5000.0000 [IU] | Freq: Three times a day (TID) | INTRAMUSCULAR | Status: DC

## 2020-06-28 MED ORDER — ONDANSETRON HCL 4 MG/2ML IJ SOLN
INTRAMUSCULAR | Status: AC
  Filled 2020-06-28: qty 2

## 2020-06-28 MED ORDER — LACTATED RINGERS IV BOLUS
1000.0000 mL | Freq: Once | INTRAVENOUS | Status: AC
  Administered 2020-06-28: 1000 mL via INTRAVENOUS

## 2020-06-28 MED ORDER — EPHEDRINE SULFATE-NACL 50-0.9 MG/10ML-% IV SOSY
PREFILLED_SYRINGE | INTRAVENOUS | Status: DC | PRN
  Administered 2020-06-28: 5 mg via INTRAVENOUS
  Administered 2020-06-28: 10 mg via INTRAVENOUS

## 2020-06-28 MED ORDER — SODIUM CHLORIDE 0.9 % IV SOLN: INTRAVENOUS | Status: DC | PRN

## 2020-06-28 MED ORDER — CHLORHEXIDINE GLUCONATE CLOTH 2 % EX PADS
6.0000 | MEDICATED_PAD | Freq: Every day | CUTANEOUS | Status: DC
  Administered 2020-06-28 – 2020-07-11 (×14): 6 via TOPICAL

## 2020-06-28 MED ORDER — ORAL CARE MOUTH RINSE
15.0000 mL | Freq: Two times a day (BID) | OROMUCOSAL | Status: DC
  Administered 2020-06-28 – 2020-07-12 (×27): 15 mL via OROMUCOSAL

## 2020-06-28 MED ORDER — MORPHINE SULFATE (PF) 2 MG/ML IV SOLN
2.0000 mg | INTRAVENOUS | Status: DC | PRN
  Administered 2020-06-28 – 2020-07-01 (×12): 2 mg via INTRAVENOUS
  Filled 2020-06-28 (×12): qty 1

## 2020-06-28 MED ORDER — SODIUM ZIRCONIUM CYCLOSILICATE 10 G PO PACK
10.0000 g | PACK | Freq: Once | ORAL | Status: AC
  Administered 2020-06-28: 10 g
  Filled 2020-06-28: qty 1

## 2020-06-28 MED ORDER — PHENYLEPHRINE 40 MCG/ML (10ML) SYRINGE FOR IV PUSH (FOR BLOOD PRESSURE SUPPORT)
PREFILLED_SYRINGE | INTRAVENOUS | Status: DC | PRN
  Administered 2020-06-28: 80 ug via INTRAVENOUS
  Administered 2020-06-28: 40 ug via INTRAVENOUS
  Administered 2020-06-28: 80 ug via INTRAVENOUS
  Administered 2020-06-28: 40 ug via INTRAVENOUS

## 2020-06-28 MED ORDER — LACTATED RINGERS IV SOLN: INTRAVENOUS | Status: DC

## 2020-06-28 MED ORDER — ONDANSETRON 4 MG PO TBDP: 4.0000 mg | ORAL_TABLET | Freq: Four times a day (QID) | ORAL | Status: DC | PRN

## 2020-06-28 MED ORDER — SODIUM CHLORIDE 0.9 % IR SOLN
Status: DC | PRN
  Administered 2020-06-28: 1
  Administered 2020-06-28: 1000 mL
  Administered 2020-06-28: 1

## 2020-06-28 MED ORDER — PANTOPRAZOLE SODIUM 40 MG IV SOLR
40.0000 mg | Freq: Two times a day (BID) | INTRAVENOUS | Status: DC
  Administered 2020-06-28 – 2020-07-01 (×7): 40 mg via INTRAVENOUS
  Filled 2020-06-28 (×7): qty 40

## 2020-06-28 MED ORDER — SODIUM CHLORIDE 0.9 % IV BOLUS
500.0000 mL | Freq: Once | INTRAVENOUS | Status: AC
  Administered 2020-06-28: 500 mL via INTRAVENOUS

## 2020-06-28 MED ORDER — METOPROLOL TARTRATE 5 MG/5ML IV SOLN: 5.0000 mg | Freq: Four times a day (QID) | INTRAVENOUS | Status: DC | PRN

## 2020-06-28 MED ORDER — FENTANYL CITRATE (PF) 100 MCG/2ML IJ SOLN
INTRAMUSCULAR | Status: DC | PRN
  Administered 2020-06-28 (×5): 50 ug via INTRAVENOUS

## 2020-06-28 MED ORDER — ROCURONIUM BROMIDE 100 MG/10ML IV SOLN
INTRAVENOUS | Status: DC | PRN
  Administered 2020-06-28: 60 mg via INTRAVENOUS
  Administered 2020-06-28: 40 mg via INTRAVENOUS

## 2020-06-28 MED ORDER — CHLORHEXIDINE GLUCONATE 0.12 % MT SOLN: 15.0000 mL | Freq: Once | OROMUCOSAL | Status: DC

## 2020-06-28 MED ORDER — PROPOFOL 1000 MG/100ML IV EMUL
5.0000 ug/kg/min | INTRAVENOUS | Status: DC
  Administered 2020-06-28: 5 ug/kg/min via INTRAVENOUS
  Filled 2020-06-28: qty 100

## 2020-06-28 MED ORDER — DIPHENHYDRAMINE HCL 12.5 MG/5ML PO ELIX
12.5000 mg | ORAL_SOLUTION | Freq: Four times a day (QID) | ORAL | Status: DC | PRN
  Administered 2020-07-07: 12.5 mg via ORAL
  Filled 2020-06-28: qty 5

## 2020-06-28 MED ORDER — SODIUM CHLORIDE 0.9 % IV BOLUS
1000.0000 mL | Freq: Once | INTRAVENOUS | Status: AC
  Administered 2020-06-28: 1000 mL via INTRAVENOUS

## 2020-06-28 MED ORDER — DOPAMINE-DEXTROSE 3.2-5 MG/ML-% IV SOLN
0.0000 ug/kg/min | INTRAVENOUS | Status: DC
  Administered 2020-06-28: 5 ug/kg/min via INTRAVENOUS
  Filled 2020-06-28: qty 250

## 2020-06-28 SURGICAL SUPPLY — 63 items
APPLIER CLIP 11 MED OPEN (CLIP)
APPLIER CLIP 13 LRG OPEN (CLIP)
BARRIER SKIN 2 3/4 (OSTOMY) IMPLANT
BARRIER SKIN 2 3/4 INCH (OSTOMY)
CELLS DAT CNTRL 66122 CELL SVR (MISCELLANEOUS) ×1 IMPLANT
CHLORAPREP W/TINT 26 (MISCELLANEOUS) ×6 IMPLANT
CLAMP POUCH DRAINAGE QUIET (OSTOMY) IMPLANT
CLIP APPLIE 11 MED OPEN (CLIP) IMPLANT
CLIP APPLIE 13 LRG OPEN (CLIP) IMPLANT
CLOTH BEACON ORANGE TIMEOUT ST (SAFETY) ×3 IMPLANT
COVER LIGHT HANDLE STERIS (MISCELLANEOUS) ×6 IMPLANT
COVER WAND RF STERILE (DRAPES) ×3 IMPLANT
DRAPE WARM FLUID 44X44 (DRAPES) ×3 IMPLANT
DRSG OPSITE POSTOP 4X10 (GAUZE/BANDAGES/DRESSINGS) IMPLANT
DRSG OPSITE POSTOP 4X8 (GAUZE/BANDAGES/DRESSINGS) ×3 IMPLANT
ELECT BLADE 6 FLAT ULTRCLN (ELECTRODE) ×3 IMPLANT
ELECT REM PT RETURN 9FT ADLT (ELECTROSURGICAL) ×3
ELECTRODE REM PT RTRN 9FT ADLT (ELECTROSURGICAL) ×1 IMPLANT
GLOVE BIO SURGEON STRL SZ 6.5 (GLOVE) ×4 IMPLANT
GLOVE BIO SURGEONS STRL SZ 6.5 (GLOVE) ×2
GLOVE BIOGEL PI IND STRL 6.5 (GLOVE) ×2 IMPLANT
GLOVE BIOGEL PI IND STRL 7.0 (GLOVE) ×2 IMPLANT
GLOVE BIOGEL PI INDICATOR 6.5 (GLOVE) ×4
GLOVE BIOGEL PI INDICATOR 7.0 (GLOVE) ×4
GLOVE SURG SS PI 7.5 STRL IVOR (GLOVE) ×3 IMPLANT
GOWN STRL REUS W/TWL LRG LVL3 (GOWN DISPOSABLE) ×9 IMPLANT
HANDLE SUCTION POOLE (INSTRUMENTS) ×1 IMPLANT
INST SET MAJOR GENERAL (KITS) ×3 IMPLANT
KIT REMOVER STAPLE SKIN (MISCELLANEOUS) IMPLANT
KIT TURNOVER KIT A (KITS) ×3 IMPLANT
LIGASURE IMPACT 36 18CM CVD LR (INSTRUMENTS) ×3 IMPLANT
MANIFOLD NEPTUNE II (INSTRUMENTS) ×3 IMPLANT
NEEDLE HYPO 18GX1.5 BLUNT FILL (NEEDLE) ×3 IMPLANT
NEEDLE HYPO 21X1.5 SAFETY (NEEDLE) ×3 IMPLANT
NS IRRIG 1000ML POUR BTL (IV SOLUTION) ×12 IMPLANT
PACK ABDOMINAL MAJOR (CUSTOM PROCEDURE TRAY) ×3 IMPLANT
PAD ARMBOARD 7.5X6 YLW CONV (MISCELLANEOUS) ×3 IMPLANT
PENCIL SMOKE EVACUATOR (MISCELLANEOUS) ×3 IMPLANT
PENCIL SMOKE EVACUATOR COATED (MISCELLANEOUS) ×3 IMPLANT
POUCH OSTOMY 2 3/4  H 3804 (WOUND CARE)
POUCH OSTOMY 2 PC DRNBL 2.75 (WOUND CARE) IMPLANT
RELOAD LINEAR CUT PROX 55 BLUE (ENDOMECHANICALS) IMPLANT
RELOAD PROXIMATE 75MM BLUE (ENDOMECHANICALS) IMPLANT
RETRACTOR WND ALEXIS 25 LRG (MISCELLANEOUS) IMPLANT
RTRCTR WOUND ALEXIS 18CM MED (MISCELLANEOUS) ×3
RTRCTR WOUND ALEXIS 25CM LRG (MISCELLANEOUS)
SET BASIN LINEN APH (SET/KITS/TRAYS/PACK) ×3 IMPLANT
SPONGE DRAIN TRACH 4X4 STRL 2S (GAUZE/BANDAGES/DRESSINGS) ×3 IMPLANT
SPONGE LAP 18X18 RF (DISPOSABLE) ×9 IMPLANT
STAPLER GUN LINEAR PROX 60 (STAPLE) IMPLANT
STAPLER PROXIMATE 55 BLUE (STAPLE) IMPLANT
STAPLER PROXIMATE 75MM BLUE (STAPLE) IMPLANT
STAPLER VISISTAT (STAPLE) ×3 IMPLANT
SUCTION POOLE HANDLE (INSTRUMENTS) ×3
SUT CHROMIC 0 SH (SUTURE) IMPLANT
SUT CHROMIC 2 0 SH (SUTURE) IMPLANT
SUT CHROMIC 3 0 SH 27 (SUTURE) IMPLANT
SUT ETHILON 3 0 FSL (SUTURE) ×3 IMPLANT
SUT PDS AB CT VIOLET #0 27IN (SUTURE) ×6 IMPLANT
SUT PROLENE 2 0 SH 30 (SUTURE) IMPLANT
SUT SILK 3 0 SH CR/8 (SUTURE) ×3 IMPLANT
SYR 20ML LL LF (SYRINGE) ×6 IMPLANT
TRAY FOLEY MTR SLVR 16FR STAT (SET/KITS/TRAYS/PACK) ×3 IMPLANT

## 2020-06-28 NOTE — Progress Notes (Addendum)
[facility]  Events of this AM. Patient with soft BP back and forth from 48E systolic for 720T systolic. Repeat Bmp with worsening hyperkalemia and AKI? Versus acute on chronic kidney injury. ABG reassuring and CXR without fluid overload and is weaning on sedation and appropriate.  I ordered EKG to ensure no peaked Ts with the hyperkalemia and EKG worrisome for heart block. I have asked Cardiology and Hospitalist to weigh in on patient that has no medical history but also does not seek medical care. Patient now complaining of some chest pain.  Patient's neighbor Suanne Marker at bedside and wants to become POA and social work has brought paperwork. Patient is alert and awake and answering questions but cannot sign until extubated. Pulmonary consulted for vent management and extubation.   Suanne Marker will try to get in touch with on of 7 daughters but is skeptical at their participation in the care and decisions.  Given soft BP, Dr. Carles Collet and I discussed CVL and potential need for pressors. Will proceed now and patient is in agreement but again remains intubated. Under 2 attending authorization we are placing the CVL.   On answering questions he responds appropriately with nods and cues and wants everything done.  Discussed risk of bleeding, infection, pneumothorax with him and Rhonda.   Curlene Labrum, MD [facility] [address] [place], [place] [address] [phone] (office)

## 2020-06-28 NOTE — Progress Notes (Addendum)
[place] [facility] Progress Note Patient Name: Frederick [name] DOB: March 11, 1941 MRN: [id number]   Date of Service  06/28/2020  HPI/Events of Note  Hyperkalemia - K+ = 5.6. Dr. Elsworth Soho concerned about peaked T wave. 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   eICU Interventions  Plan: 1. D50 1 amp IV now. 2. Novolog insulin 10 units IV X 1 now.  3. Calcium gluconate 1 gm IV now. 4. NaHCO3 100 meq IV now.  5. Lokelma 10 gm per tube now.  6. Repeat BMP at 1 AM.      Intervention Category Major Interventions: Electrolyte abnormality - evaluation and management  [name] 06/28/2020, 7:37 PM

## 2020-06-28 NOTE — Progress Notes (Signed)
Updated Dr. Constance Haw on patients JP drain output, and low blood pressures. Orders for 500cc bolus, change fluids to NS, and potential for vasopressors if the bolus and fluids do not work. Patient friend Ms. Boswell from chart updated and told if we did need to start pressors and obtain a central line, we would need a family members number for consent. Ms. Charlynn Grimes stated that none of the patient's daughters visit him or speak with him a lot, but there is one who speaks with him some and she could give [place] her number only if absolutely necessary because she is apparently, "very emotional," and "takes things way out of hand." Told her I would try to notify her sooner than late once again for the number if we felt the need for a central line and pressors was necessary. Will continue to monitor.

## 2020-06-28 NOTE — ED Notes (Signed)
Consents signed, CRNA at bedside to transport pt, belongings including clothing placed in pt belonging bag on stretcher.

## 2020-06-28 NOTE — Consult Note (Signed)
Cardiology Consultation:   Patient ID: Frederick [name] MRN: [id number]; DOB: 08/14/1941  Admit date: 06/27/2020 Date of Consult: 06/28/2020  Primary Care Provider: Patient, No Pcp Per [place] Cardiologist: New, Dr Harl Bowie MD [place] Electrophysiologist:  None    Patient Profile:   Frederick [name] is a 79 y.o. male with no clear past medical history who is being seen today for the evaluation of heart blocker at the request of Dr Constance Haw.  History of Present Illness:   Frederick [name] 79 yo male does not see physician regularly and thus no know prior medical history admitted yesterday with abdominal pain. He is intubated and sedated, unable to get any further informtation. Admitted with gastric perforation, taken to OR early this AM for gastric ulcer perforation. 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 Patient intubated so not able to give history though he is able to indicate that he has not had any syncope or lightheadedness or dizziness.    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    History reviewed. No pertinent past medical history.  History reviewed. No pertinent surgical history.   Inpatient Medications: Scheduled Meds: . chlorhexidine gluconate (MEDLINE KIT)  15 mL Mouth Rinse BID  . Chlorhexidine Gluconate Cloth  6 each Topical Daily  . heparin injection (subcutaneous)  5,000 Units Subcutaneous Q8H  . mouth rinse  15 mL Mouth Rinse 10 times per day  . pantoprazole (PROTONIX) IV  40 mg Intravenous Q12H   Continuous Infusions: . sodium chloride    . sodium chloride 100 mL/hr at 06/28/20 0953  . piperacillin-tazobactam (ZOSYN)  IV Stopped (06/28/20 0859)  . propofol (DIPRIVAN) infusion 2.5 mcg/kg/min (06/28/20 0940)    PRN Meds: Place/Maintain arterial line **AND** sodium chloride, diphenhydrAMINE **OR** diphenhydrAMINE, metoprolol tartrate, morphine injection, ondansetron **OR** ondansetron (ZOFRAN) IV  Allergies:   No Known Allergies  Social History:   Social History   Socioeconomic History  . Marital status: Widowed    Spouse name: Not on file  . Number of children: Not on file  . Years of education: Not on file  . Highest education level: Not on file  Occupational History  . Not on file  Tobacco Use  . Smoking status: Light Tobacco Smoker  Substance and Sexual Activity  . Alcohol use: Not on file  . Drug use: Not on file  . Sexual activity: Not on file  Other Topics Concern  . Not on file  Social History Narrative  . Not on file   Social Determinants of Health   Financial Resource Strain:   . Difficulty of Paying Living Expenses: Not on file  Food Insecurity:   . Worried About Charity fundraiser in the Last Year: Not on file  . Ran Out of Food in the Last Year: Not on file  Transportation Needs:   . Lack of Transportation (Medical): Not on file  . Lack of Transportation (Non-Medical): Not on file  Physical Activity:   . Days of Exercise per Week: Not on file  . Minutes of Exercise per Session: Not on file  Stress:   . Feeling of Stress : Not on file  Social Connections:   . Frequency of Communication with Friends and Family: Not on file  . Frequency of  Social Gatherings with Friends and Family: Not on file  . Attends Religious Services: Not on file  . Active Member of Clubs or Organizations: Not on file  . Attends Archivist Meetings: Not on file  . Marital Status: Not on file  Intimate Partner Violence:   . Fear of Current or Ex-Partner: Not on file  . Emotionally Abused: Not on file  . Physically Abused: Not on file  . Sexually Abused: Not on file    Family History:   History reviewed. No pertinent family history.   ROS:  Please see the history of  present illness.   All other ROS reviewed and negative.     Physical Exam/Data:   Vitals:   06/28/20 1130 06/28/20 1145 06/28/20 1214 06/28/20 1215  BP:  (!) 93/58  (!) 97/53  Pulse: (!) 53 67  70  Resp: (!) _0 Temp:      TempSrc:      SpO2: 99% 100% 100% 100%  Weight:      Height:        Intake/Output Summary (Last 24 hours) at 06/28/2020 1249 Last data filed at 06/28/2020 0940 Gross per 24 hour  Intake 6169.18 ml  Output 595 ml  Net 5574.18 ml   Last 3 Weights 06/27/2020  Weight (lbs) 140 lb  Weight (kg) 63.504 kg     Body mass index is 21.29 kg/m.  General:  Well nourished, well developed, in no acute distress HEENT: normal Lymph: no adenopathy Neck: no JVD Endocrine:  No thryomegaly Vascular: No carotid bruits; FA pulses 2+ bilaterally without bruits  Cardiac:  normal S1, S2; RRR; no murmur  Lungs:  clear to auscultation bilaterally, no wheezing, rhonchi or rales  Abd: soft, nontender, no hepatomegaly  Ext: no edema Musculoskeletal:  No deformities, BUE and BLE strength normal and equal Skin: warm and dry  Neuro:  CNs 2-12 intact, no focal abnormalities noted Psych:  Normal affect    Laboratory Data:  High Sensitivity Troponin:  No results for input(s): TROPONINIHS in the last 720 hours.   Chemistry Recent Labs  Lab 06/27/20 2009 06/28/20 0522 06/28/20 1016  NA 135 134* 132*  K 4.4 5.2* 5.3*  CL 97* 104 106  CO2 25 21* 21*  GLUCOSE 150* 107* 106*  BUN 59* 56* 57*  CREATININE 2.42* 2.52* 2.72*  CALCIUM 8.9 7.7* 7.2*  GFRNONAA 26* 25* 23*  ANIONGAP _1 Recent Labs  Lab 06/27/20 2009  PROT 7.2  ALBUMIN 3.7  AST 21  ALT 14  ALKPHOS 47  BILITOT 1.1   Hematology Recent Labs  Lab 06/27/20 2009 06/28/20 0522  WBC 8.7 4.3  RBC 3.93* 3.69*  HGB 12.3* 11.6*  HCT 38.2* 38.2*  MCV 97.2 103.5*  MCH 31.3 31.4  MCHC 32.2 30.4  RDW 14.6 14.8  PLT 392 316   BNPNo results for input(s): BNP, PROBNP in the last 168 hours.   DDimer No results for input(s): DDIMER in the last 168 hours.   Radiology/Studies:  CT ABDOMEN PELVIS WO CONTRAST  Result Date: 06/27/2020 CLINICAL DATA:  Abdominal pain EXAM: CT ABDOMEN AND PELVIS WITHOUT CONTRAST TECHNIQUE: Multidetector CT imaging of the abdomen and pelvis was performed following the standard protocol without IV contrast. COMPARISON:  None. FINDINGS: Lower chest: Lung bases demonstrate mild emphysematous disease. No acute consolidation or effusion. Minimal subpleural fibrosis. Coronary vascular calcification. Hepatobiliary: No focal liver abnormality is seen. No gallstones, gallbladder wall thickening, or biliary  dilatation. Pancreas: Unremarkable. No pancreatic ductal dilatation or surrounding inflammatory changes. Spleen: Normal in size without focal abnormality. Adrenals/Urinary Tract: Adrenal glands are normal. Kidneys show no hydronephrosis. Multiple low-attenuation lesions within both kidneys favored to represent cysts though incompletely characterized without contrast. Slightly thick-walled urinary bladder. Stomach/Bowel: Wall thickening at the pre pyloric region of the stomach with suspected focal defect/perforation in the wall, coronal series 5, image number 23. No small bowel dilatation. Moderate stool in the colon. Negative appendix. 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 Vascular/Lymphatic: Moderate aortic atherosclerosis without aneurysm. Ectatic common iliac vessels measuring 1.6 cm on the right and 1.8 cm on the left. No suspicious adenopathy. Reproductive: Slightly enlarged prostate with mass effect on the bladder Other: Moderate free air within the upper abdomen. Small amount of ascites within the abdomen and pelvis. Musculoskeletal: No acute or significant osseous findings. IMPRESSION: 1. Moderate free air in the upper abdomen with small amount of free fluid in the abdomen and pelvis consistent with hollow viscus perforation. Suspected  source is the pre-pyloric region of the stomach where there is wall thickening and apparent defect, either representing perforated ulcer or perforated gastric mass. 2. 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 Slightly enlarged prostate. Bladder slightly thick walled which may be due to cystitis or chronic obstruction. Critical Value/emergent results were called by telephone at the time of interpretation on 06/27/2020 at 9:44 pm to provider JULIE HAVILAND , who verbally acknowledged these results. Aortic Atherosclerosis (ICD10-I70.0). Electronically Signed   By: Donavan Foil M.D.   On: 06/27/2020 21:44   DG CHEST PORT 1 VIEW  Result Date: 06/28/2020 CLINICAL DATA:  Shortness of breath EXAM: PORTABLE CHEST 1 VIEW COMPARISON:  Earlier same day FINDINGS: Endotracheal and partially imaged enteric tubes again identified. Stable interstitial prominence likely reflecting changes of COPD. No significant pleural effusion. No pneumothorax. Stable cardiomediastinal contours. IMPRESSION: Stable appearance. Interstitial prominence is nonspecific and could reflect chronic changes of COPD. Electronically Signed   By: Macy Mis M.D.   On: 06/28/2020 09:44   DG Chest Port 1 View  Result Date: 06/28/2020 CLINICAL DATA:  Intubation. EXAM: PORTABLE CHEST 1 VIEW COMPARISON:  One-view chest x-ray 06/27/2020 FINDINGS: Patient has been intubated. Endotracheal tube terminates 4.8 cm above the carina. Side port of the NG tube courses scratched at the side port of the NG tube is in the fundus the stomach. The heart size is normal. Atherosclerotic calcifications are present at the aortic arch. Interstitial and airspace opacities have slightly increased diffusely. No significant airspace consolidation is present. No pneumothorax is present. IMPRESSION: 1. Interval intubation and placement of NG tube. Placement is satisfactory as described above. 2. Slight increase in interstitial and airspace disease  diffusely. This is concerning for edema or infection. Electronically Signed   By: San Morelle M.D.   On: 06/28/2020 04:30   DG Chest Portable 1 View  Result Date: 06/27/2020 CLINICAL DATA:  Abdomen pain EXAM: PORTABLE CHEST 1 VIEW COMPARISON:  CT 06/27/2020 FINDINGS: The free air noted on CT is not well seen radiographically. No focal consolidation or effusion. Normal cardiomediastinal silhouette with aortic atherosclerosis. No pneumothorax. IMPRESSION: No active disease. The free air noted on CT is not well seen radiographically. Electronically Signed   By: Donavan Foil M.D.   On: 06/27/2020 22:44     Assessment and Plan:   1. Heart block - initial preop EKG sinus with very long first degree av block, LAFB. No prior EKGs to compare, has not seen  medical providers in several years. Clear evidence of likely chronic conduction disease on presentation.  - EKG this AM looks like complete heart block, stable junctional rhythm in 60s to 80s. Perhaps variable chronic conduction disease vs increased vagal tone from abdominal surgery, acute abdominal process - with stable junction rhythm no indication for temp pacing. Certaintly with gastric perforation on antibiotics would look to avoid any permanent pacer placement. If bradycardia develops would start dopamine drip if hypotensive or dobutamine drip if normal bp. Monitor rhythm as he recovers from acute illness. Will get more history from him once he is extubated. - obtain echo  2. Hypotension - in setting of gastric perforation likely sepsis, procalcitonin and lactic acid are pending - no evidence of volume overload - if negative sepsis workup can send coox panel from central line - in absence of bradycardia his heart block is not playing a significant role in his hypotension - if MAPs consistently below 65 would start levophed, he has central access  3. Renal failure - unclear baseline, unknown if acute failure in setting of gastric  perforation and hypotension - keep MAPs above 65   For questions or updates, please contact [place] Please consult [email or web address] for contact info under    Signed, Carlyle Dolly, MD  06/28/2020 12:49 PM

## 2020-06-28 NOTE — Procedures (Signed)
Extubation Procedure Note  Patient Details:   Name: Frederick [name] DOB: 1941/01/01 MRN: [id number]   Airway Documentation:    Vent end date: (not recorded) Vent end time: (not recorded)   Evaluation  O2 sats: stable throughout Complications: No apparent complications Patient did tolerate procedure well. Bilateral Breath Sounds: Clear, Diminished   Yes   Patient extubated per MD Alva and placed on 3LNC. Patient tolerated well. Able to speak and clear secretions.  Blanchie Serve 06/28/2020, 4:45 PM

## 2020-06-28 NOTE — Consult Note (Signed)
NAME:  Frederick [name], MRN:  [id number], DOB:  Apr 03, 1941, LOS: 0 ADMISSION DATE:  06/27/2020, CONSULTATION DATE:  06/28/2020  REFERRING MD:  Tat, [facility], CHIEF COMPLAINT:  Intubated post surgery   Brief History   79 year old man admitted 10/25 with gastric perforation, required laparotomy and omental patch.  Postop course complicated by hypotension, AKI and complete heart block with junctional escape rhythm and mild hyperkalemia.  He was left intubated post procedure  History of present illness   79 year old man presented 10/25 as an acute abdomen.  History is obtained after review of the chart and speaking to bedside nurse.  CT showed free air in the abdomen consistent with perforated viscus.  He underwent exploratory laparotomy with omental patch placement for perforated stomach.  He was left intubated post procedure. Labs showed AKI with creatinine 2.4 range, no previous creatinine available.  On 10/20 6 AM he was mildly hypotensive, EKG showed complete heart block with junctional rhythm, cardiology was consulted.  We are asked by [facility] hospitalist to assist with ventilator management  Past [facility] Events   10/25 exploratory laparotomy with omental patch  Consults:  General surgery Cardiology   Procedures:  ETT 10/25 >> RIJ CVL 10/26 >>   Significant Diagnostic Tests:  CT abdomen/pelvis 10/25 >> free air, left colon diverticular disease, enlarged prostate US renal 10/26 >> mildly atrophic and echogenic kidneys suggestive of CKD, bilateral renal cysts  Micro Data:  Blood 10/25 >> Peritoneal fluid 10/25 >>  Antimicrobials:  Zosyn 10/26 >>   Interim history/subjective:    Objective   Blood pressure (!) 101/59, pulse 75, temperature 98 F (36.7 C), temperature source Oral, resp. rate 19, height 5\' 8"  (1.727 m), weight 63.5 kg, SpO2 96 %.    Vent Mode: PSV;CPAP FiO2 (%):  [35 %-50 %] 35 % Set Rate:  [15 bmp] 15 bmp Vt Set:  [540 mL] 540  mL PEEP:  [5 cmH20] 5 cmH20 Pressure Support:  [10 cmH20] 10 cmH20 Plateau Pressure:  [13 cmH20] 13 cmH20   Intake/Output Summary (Last 24 hours) at 06/28/2020 1614 Last data filed at 06/28/2020 1516 Gross per 24 hour  Intake 6729.74 ml  Output 645 ml  Net 6084.74 ml   Filed Weights   06/27/20 1945  Weight: 63.5 kg    Examination: Gen:      elderly man, no distress , intubated HEENT:  EOMI, sclera anicteric, mild pallor  Neck:     No JVD; no thyromegaly, RIJ CVL Lungs:  Decreased BS BL CV:         Regular rate and rhythm; no murmurs Abd:      absent bowel sounds; soft,  Diffusely tender; no palpable masses, no distension Ext:    No edema; adequate peripheral perfusion Skin:      Warm and dry; no rash Neuro: alert , follows commands, non focal     Chest x-ray personally reviewed which shows right IJ and ET tube in position, no new infiltrates or effusions.  EKG personally reviewed which shows complete heart block with junctional rhythm, although read as Mobitz type I Resolved Hospital Problem list     Assessment & Plan:   Postop respiratory failure -Spontaneous breathing trials  - he tolerated PS 5/5 & was extubated to [place]  Likely COPD - duonebs 4 times /day  AKI -possibly underlying CKD based on renal ultrasound Oliguric Hyperkalemia -follow bmet and treat if bradycardia worsens or more than 5.5  Complete heart block with  junctional escape -followed by cardiology. Since mildly hypotensive, will use dopamine to keep HR >60 & SBP >90  Best practice:  Diet: NPO Pain/Anxiety/Delirium protocol (if indicated): Propofol/fentanyl VAP protocol (if indicated): Y DVT prophylaxis: Subcu heparin GI prophylaxis: Protonix Glucose control: SSI Mobility: Bedrest Code Status: Full Family Communication: Per primary Disposition: ICU  Labs   CBC: Recent Labs  Lab 06/27/20 2009 06/28/20 0522  WBC 8.7 4.3  NEUTROABS 7.7 2.5  HGB 12.3* 11.6*  HCT 38.2* 38.2*  MCV 97.2  103.5*  PLT 392 616    Basic Metabolic Panel: Recent Labs  Lab 06/27/20 2009 06/28/20 0522 06/28/20 1016  NA 135 134* 132*  K 4.4 5.2* 5.3*  CL 97* 104 106  CO2 25 21* 21*  GLUCOSE 150* 107* 106*  BUN 59* 56* 57*  CREATININE 2.42* 2.52* 2.72*  CALCIUM 8.9 7.7* 7.2*  MG  --  2.5*  --   PHOS  --  5.1*  --    GFR: Estimated Creatinine Clearance: 19.5 mL/min (A) (by C-G formula based on SCr of 2.72 mg/dL (H)). Recent Labs  Lab 06/27/20 2009 06/28/20 0522 06/28/20 1521  WBC 8.7 4.3  --   LATICACIDVEN  --   --  3.1*    Liver Function Tests: Recent Labs  Lab 06/27/20 2009  AST 21  ALT 14  ALKPHOS 47  BILITOT 1.1  PROT 7.2  ALBUMIN 3.7   Recent Labs  Lab 06/27/20 2009  LIPASE 58*   No results for input(s): AMMONIA in the last 168 hours.  ABG    Component Value Date/Time   PHART 7.308 (L) 06/28/2020 0610   PCO2ART 47.1 06/28/2020 0610   PO2ART 191 (H) 06/28/2020 0610   HCO3 22.0 06/28/2020 0610   ACIDBASEDEF 2.4 (H) 06/28/2020 0610   O2SAT 98.7 06/28/2020 0610     Coagulation Profile: No results for input(s): INR, PROTIME in the last 168 hours.  Cardiac Enzymes: No results for input(s): CKTOTAL, CKMB, CKMBINDEX, TROPONINI in the last 168 hours.  HbA1C: No results found for: HGBA1C  CBG: Recent Labs  Lab 06/28/20 0430 06/28/20 0758 06/28/20 1139  GLUCAP 89 110* 90    Review of Systems:   Unable to obtain since intubated  Past Medical History  He,  has no past medical history on file.   Surgical History   History reviewed. No pertinent surgical history.   Social History   reports that he has been smoking. He does not have any smokeless tobacco history on file.   Family History   His family history is not on file.   Allergies No Known Allergies   Home Medications  Prior to Admission medications   Medication Sig Start Date End Date Taking? Authorizing Provider  acetaminophen (TYLENOL) 500 MG tablet Take 500 mg by mouth every 6  (six) hours as needed.   Yes [provider]     My critical care time x 53mins  Kara Mead MD. FCCP. [facility] See Amion for pager  If no response to pager , please call [phone]  After 7:00 pm call Elink  [phone]   06/28/2020

## 2020-06-28 NOTE — Op Note (Signed)
[facility] Operative Note  06/28/20  Preoperative Diagnosis: Bowel perforation, free air    Postoperative Diagnosis: Gastric ulcer perforation   Procedure(s) Performed: Exploratory laparotomy, vascularized omental patch    Surgeon: Lanell Matar. Constance Haw, MD   Assistants: No qualified resident was available    Anesthesia: General endotracheal   Anesthesiologist: Denese Killings, MD    Specimens:  Cultures intraperitoneal    Estimated Blood Loss: Minimal   Blood Replacement: None    Complications: None   Wound Class: Contaminated    Operative Indications: Frederick [name] is a 77 with worsening abdominal pain that because acutely severe today. He came to the Ed and was found to have bowel perforation with concern for a gastric perforation. We discussed exploration, possible bowel resection, omental patch, and possibility of finding cancer. We discussed potential need for blood, and he opted to proceed.   Findings: <1cm perforation, anterior stomach lesser curve, induration/ woody hardness in the area   Procedure: The patient was taken to the operating room and placed supine. General endotracheal anesthesia was induced. Intravenous antibiotics were administered per protocol.  A nasoastric tube positioned to decompress the stomach as he was unable to tolerate this preop. A foley catheter was placed. an arterial line was placed by anesthesia. The abdomen was prepared and draped in the usual sterile fashion.   An upper midline incision was made and carried down through to the fascia, and the peritoneum was entered carefully. Murky fluid was encountered and cultured. A wound protector was placed.  This was suctioned out, and a <1 cm anterior stomach perforation was noted on the lesser curvature. The NG was secured in good position in the greater curvature. The perforation was woody and indurated and it was difficult to tell if there was a mass. The perforation was small and  I did not want to open it more to get biopsies as he can be treated for H pylori empirically and get a post operative EGD.   Two full thickness interrupted 3-0 silk sutures were placed out approximately 1cm from the ulceration.  A tongue of omentum was created off the greater curvature as there was not an abundance of omentum. This tongue was created with a Ligasure. The tongue remained well vascularized. It was placed over the ulceration and was tied down into place with the silk sutures. I secured this further with additional interrupted 3-0 silk sutures on the edge of the tongue.   The abdomen was irrigated. No further contamination was noted. A JP drain was placed between the left lobe of the liver and the perforation, and secured in the right mid abdomen with a 3-0 nylon.   The wound protector was removed. The fascia was closed in the standard fashion with 0 PDS suture. The wound was irrigated, and the skin was closed with staples. A honeycomb dressing was applied.    Final inspection revealed acceptable hemostasis. All counts were correct at the end of the case. The patient was taken to the ICU in stable condition but had received several liters of fluid preoperatively and intraoperatively and had some soft BP so he was left intubated.   Frederick Labrum, MD [facility] [address] [place], [place] [address] [phone] (office)

## 2020-06-28 NOTE — Anesthesia Procedure Notes (Signed)
Procedure Name: Intubation Date/Time: 06/28/2020 1:41 AM Performed by: Vista Deck, CRNA Pre-anesthesia Checklist: Patient identified, Patient being monitored, Timeout performed, Emergency Drugs available and Suction available Patient Re-evaluated:Patient Re-evaluated prior to induction Oxygen Delivery Method: Circle System Utilized Preoxygenation: Pre-oxygenation with 100% oxygen Induction Type: IV induction Ventilation: Mask ventilation without difficulty Laryngoscope Size: Mac and 3 Grade View: Grade I Tube type: Oral Tube size: 7.0 mm Number of attempts: 1 Airway Equipment and Method: stylet Placement Confirmation: ETT inserted through vocal cords under direct vision,  positive ETCO2 and breath sounds checked- equal and bilateral Secured at: 23 cm Tube secured with: Tape Dental Injury: Teeth and Oropharynx as per pre-operative assessment

## 2020-06-28 NOTE — Progress Notes (Signed)
Pharmacy Antibiotic Note  Frederick [name] is a 79 y.o. male admitted on 06/27/2020 with perforated ulcer.  Pharmacy has been consulted for Zosyn dosing. Pt s/p OR. WBC WNL. Noted renal dysfunction.   Plan: Zosyn 3.375G IV q8h x 4 days as requested by MD  Trend WBC, temp, renal function  F/U cultures     Height: 5\' 8"  (172.7 cm) Weight: 63.5 kg (140 lb) IBW/kg (Calculated) : 68.4  Temp (24hrs), Avg:97.6 F (36.4 C), Min:97.4 F (36.3 C), Max:97.8 F (36.6 C)  Recent Labs  Lab 06/27/20 2009  WBC 8.7  CREATININE 2.42*    Estimated Creatinine Clearance: 21.9 mL/min (A) (by C-G formula based on SCr of 2.42 mg/dL (H)).    No Known Allergies  Narda Bonds, PharmD, BCPS Clinical Pharmacist Phone: [phone]

## 2020-06-28 NOTE — Anesthesia Postprocedure Evaluation (Signed)
Anesthesia Post Note  Patient: Frederick [name]  Procedure(s) Performed: EXPLORATORY LAPAROTOMY, graham patch (N/A )  Patient location during evaluation: ICU Anesthesia Type: General Level of consciousness: awake and alert and patient cooperative Pain management: satisfactory to patient Vital Signs Assessment: post-procedure vital signs reviewed and stable Respiratory status: spontaneous breathing Cardiovascular status: stable Postop Assessment: no apparent nausea or vomiting Anesthetic complications: no   No complications documented.   Last Vitals:  Vitals:   06/28/20 0330 06/28/20 0334  BP:    Pulse: 74   Resp: 16   Temp: (!) 36.3 C   SpO2:  100%    Last Pain:  Vitals:   06/27/20 2203  TempSrc:   PainSc: 8                  [name]

## 2020-06-28 NOTE — Progress Notes (Signed)
Spoke with one of the daughter's [place]. Gave an update with patient permission on everything that is going on with the patient. She was very thankful for the update and said she would be the main contact person to relay info to the rest of her family members.

## 2020-06-28 NOTE — Procedures (Signed)
Procedure Note  06/28/20   Preoperative Diagnosis: Gastric perforation, hypotension, complete heart block, renal insufficiency    Postoperative Diagnosis: Same   Procedure(s) Performed: Central Line placement, right internal jugular    Surgeon: Lanell Matar. Constance Haw, MD   Assistants: None   Anesthesia: 1% lidocaine    Complications: None    Indications: Frederick [name] is a 79 y.o. with gastric perforation, heart block post operatively with hypotension that may need pressors. I discussed the risk and benefits of placement of the central line with the patient who is awake but intubated and with Dr. Carles Collet. The patient has a neighbor but no family easily available for decision making. Dr. Carles Collet and I agree he needs a central line for monitoring and medications, and the patient agrees. We discussed the risk of bleeding, infection, and risk of pneumothorax.    Procedure: The patient placed supine. The right chest and neck was prepped and draped in the usual sterile fashion.  Wearing full gown and gloves, I performed the procedure.  One percent lidocaine was used for local anesthesia. An ultrasound was utilized to assess the jugular vein.  The needle with syringe was advanced into the vein with dark venous return, and a wire was placed using the Seldinger technique without difficulty.  Ectopia was noted and the wire was pulled back.  The skin was knicked and a dilator was placed, and the three lumen catheter was placed over the wire with continued control of the wire.  There was good draw back of blood from all three lumens and each flushed easily with saline.  The catheter was secured in 4 points with 2-0 silk and a biopatch and dressing was placed.     The patient tolerated the procedure well, and the CXR was ordered to confirm position of the central line.   Frederick Labrum, MD [facility] [address] [place], [place] [address] [phone] (office)

## 2020-06-28 NOTE — Progress Notes (Addendum)
[facility]  Notified neighbor Suanne Marker that surgery completed. Given fluid resuscitation and soft BP anesthesia wants to leave intubated.   Propofol for sedation PRN morphine for pain Respiratory consult for vent, ABG in AM Pulmonary consult NPO, NG, abdominal binder, will need UGI before restarting diet  Will need outpatient EGD Will plan to treat empirically for H pylori  Protonix BID  Labs in AM LR @ 100, foley overnight ? AKI versus acute on chronic  SCDs, heparin sq  Arterial line in place for monitoring, will continue for now   Curlene Labrum, MD [facility] [address] [place], [place] [address] [phone] (office)

## 2020-06-28 NOTE — Transfer of Care (Signed)
Immediate Anesthesia Transfer of Care Note  Patient: Frederick [name]  Procedure(s) Performed: EXPLORATORY LAPAROTOMY, graham patch (N/A )  Patient Location: ICU  Anesthesia Type:General  Level of Consciousness: Patient remains intubated per anesthesia plan  Airway & Oxygen Therapy: Patient remains intubated per anesthesia plan and Patient placed on Ventilator (see vital sign flow sheet for setting)  Post-op Assessment: Report given to RN and Post -op Vital signs reviewed and stable  Post vital signs: Reviewed and stableSee  Last Vitals:  Vitals Value Taken Time  BP    Temp    Pulse    Resp    SpO2      Last Pain:  Vitals:   06/27/20 2203  TempSrc:   PainSc: 8      See ICU flow sheets for vital signs.    Complications: No complications documented.

## 2020-06-28 NOTE — Consult Note (Signed)
History and Physical  Frederick [name] WUJ:[id number] DOB: [birth year] DOA: 06/27/2020   PCP: Patient, No Pcp Per   Patient coming from: Home  Chief Complaint: abdominal pain  HPI:  Frederick [name] is a 79 y.o. male with no documented chronic medical problems presenting with 2-week history of abdominal pain that was intermittent in nature, occasionally worse with food.  The patient has not seen a physician for nearly 5 years.  He does not take any prescription medications.  Apparently, the patient ate some chocolate on 06/27/2020 after which he had unrelenting epigastric abdominal pain.  There was some nausea without any emesis.  The patient denies any NSAIDs.  Because of his abdominal pain, the patient presented for further evaluation.  He had denied any chest pain, shortness breath, diarrhea, dysuria.  In the emergency department, the patient was afebrile with soft blood pressures.  CT of the abdomen and pelvis showed moderate free air in the upper abdomen with a small amount of free fluid consistent with perforated viscus.  It was suspected that this was likely in the prepyloric region of the stomach.  General surgery was consulted.  The patient was taken to the operating room and underwent an exploratory laparotomy with vascular omental patch placement.  Unfortunately, there was difficulty extubating the patient postoperatively.  On the morning of 06/28/2020, the patient began developing hypotension.  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  As result, internal medicine consultation was obtained for medical management. Notably, the patient has smoked up to 1 pack/day for least the last 60 years.  There is no history of illicit drug use or alcohol abuse.  BMP 132, potassium 5.3, chloride 106, CO2 21, BUN 57, creatinine 2.72.  WBC 4.3, hemoglobin 11.6, platelets 216,000.  Assessment/Plan: Acute respiratory failure with hypoxia -Patient remains on the ventilator -Decrease  sedation and turn off -Consult PCCM -Patient likely has underlying COPD given his prolonged tobacco history -Personally reviewed chest x-ray--mild interstitial prominence without consolidation  Hypotension -check PCT -check lactate -continue NS -continue zosyn -discussed with Dr. Lewanda Rife central line -blood cultures x 2 sets -UA/ culture -A.m. cortisol -May need to initiate vasopressors  Perforated Stomach -Continue PPI twice daily -Ultimately will need EGD -Status post ex lap with vascularized omental patch -Postoperative care per general surgery  -may need TPN if unable to feed enterally  Hyperkalemia -Anticipate improvement with aggressive fluid resuscitation  Hyponatremia -Secondary to volume depletion and poor solute intake -Continue normal saline -A.m. 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 He does not have any smokeless tobacco history on file. No history on file for alcohol use and drug use.   Family Hx--patient denies  No Known Allergies   Prior to Admission medications   Medication Sig Start Date End Date Taking? Authorizing Provider  acetaminophen (TYLENOL) 500 MG tablet Take 500 mg by mouth every 6 (six) hours as needed.   Yes [provider]    Review of Systems:  Constitutional:  No weight loss, night sweats, Head&Eyes: No headache.  No vision loss.  ENT:  No Difficulty swallowing,Tooth/dental problems,Sore throat,   Cardio-vascular:  No chest pain, Orthopnea, PND, swelling  in lower  GI:  No  abdominal pain, nausea, vomiting, diarrhea, loss of appetite, hematochezia, melena, heartburn, indigestion, Resp:  No shortness of breath with exertion or at rest. No cough. No coughing up of blood .No wheezing.No chest wall deformity  Skin:  no rash or lesions.  GU:  no dysuria, change in color of urine, no urgency or  Musculoskeletal:  No joint pain or swelling. No decreased range of motion. Psych:  No change in mood or affect. No depression or anxiety. Neurologic: No headache, no dysesthesia, no focal weakness,  Physical Exam: Vitals:   06/28/20 1300 06/28/20 1315 06/28/20 1330 06/28/20 1400  BP: (!) 100/49 (!) 90/51 119/84 (!) 86/48  Pulse: 68 62 (!) 56 78  Resp: (!) 8 20 16  (!) 21  Temp:      TempSrc:      SpO2: 100% 100% 99% 100%  Weight:      Height:       General:  A&O x 2, NAD, nontoxic, pleasant/cooperative Head/Eye: No conjunctival hemorrhage, no icterus, [place]/AT, No nystagmus ENT:  No icterus,  No thrush, good dentition, no pharyngeal exudate Neck:  No masses, no lymphadenpathy, no bruits CV:  RRR, no rub, no gallop, no S3 Lung: Diminished breath sounds bilateral.  Scattered bilateral rales. Abdomen: soft/NT, +BS, nondistended, no peritoneal signs Ext: No cyanosis, No rashes, No petechiae, No lymphangitis, Non pitting edema; right lower extremity pretibial scabbing--no erythema, drainage, necrosis Neuro: CNII-XII intact, strength 4/5 in bilateral upper and lower extremities, no dysmetria  Labs on Admission:  Basic Metabolic Panel: Recent Labs  Lab 06/27/20 2009 06/27/20 2009 06/28/20 0522 06/28/20 1016  NA 135  --  134* 132*  K 4.4   < > 5.2* 5.3*  CL 97*  --  104 106  CO2 25  --  21* 21*  GLUCOSE 150*  --  107* 106*  BUN 59*  --  56* 57*  CREATININE 2.42*  --  2.52* 2.72*  CALCIUM 8.9  --  7.7* 7.2*  MG  --   --  2.5*  --   PHOS  --   --  5.1*  --    < > = values in this interval not displayed.   Liver Function Tests: Recent  Labs  Lab 06/27/20 2009  AST 21  ALT 14  ALKPHOS 47  BILITOT 1.1  PROT 7.2  ALBUMIN 3.7   Recent Labs  Lab 06/27/20 2009  LIPASE 58*   No results for input(s): AMMONIA in the last 168 hours. CBC: Recent Labs  Lab 06/27/20 2009 06/28/20 0522  WBC 8.7 4.3  NEUTROABS 7.7 2.5  HGB 12.3* 11.6*  HCT 38.2* 38.2*  MCV 97.2 103.5*  PLT 392 316   Coagulation Profile: No results for input(s): INR, PROTIME in the last 168 hours. Cardiac Enzymes: No results for input(s): CKTOTAL, CKMB, CKMBINDEX, TROPONINI in the last 168 hours. BNP: Invalid input(s): POCBNP CBG: Recent Labs  Lab 06/28/20 0430 06/28/20 0758 06/28/20 1139  GLUCAP 89 110* 90   Urine analysis: No results found for: COLORURINE, APPEARANCEUR, LABSPEC, PHURINE, GLUCOSEU, HGBUR, BILIRUBINUR, KETONESUR, PROTEINUR, UROBILINOGEN, NITRITE, LEUKOCYTESUR Sepsis Labs: @LABRCNTIP (procalcitonin:4,lacticidven:4) ) Recent Results (from the past 240 hour(s))  Respiratory Panel by RT PCR (Flu A&B, Covid) - Nasopharyngeal Swab     Status: None   Collection Time: 06/27/20  9:48 PM   Specimen: Nasopharyngeal Swab  Result Value Ref  Range Status   SARS Coronavirus 2 by RT PCR NEGATIVE NEGATIVE Final    Comment: (NOTE) SARS-CoV-2 target nucleic acids are NOT DETECTED.  The SARS-CoV-2 RNA is generally detectable in upper respiratoy specimens during the acute phase of infection. The lowest concentration of SARS-CoV-2 viral copies this assay can detect is 131 copies/mL. A negative result does not preclude SARS-Cov-2 infection and should not be used as the sole basis for treatment or other patient management decisions. A negative result may occur with  improper specimen collection/handling, submission of specimen other than nasopharyngeal swab, presence of viral mutation(s) within the areas targeted by this assay, and inadequate number of viral copies (<131 copies/mL). A negative result must be combined with  clinical observations, patient history, and epidemiological information. The expected result is Negative.  Fact Sheet for Patients:  PinkCheek.be  Fact Sheet for Healthcare Providers:  GravelBags.it  This test is no t yet approved or cleared by the [place] FDA and  has been authorized for detection and/or diagnosis of SARS-CoV-2 by FDA under an Emergency Use Authorization (EUA). This EUA will remain  in effect (meaning this test can be used) for the duration of the COVID-19 declaration under Section 564(b)(1) of the Act, 21 U.S.C. section 360bbb-3(b)(1), unless the authorization is terminated or revoked sooner.     Influenza A by PCR NEGATIVE NEGATIVE Final   Influenza B by PCR NEGATIVE NEGATIVE Final    Comment: (NOTE) The Xpert Xpress SARS-CoV-2/FLU/RSV assay is intended as an aid in  the diagnosis of influenza from Nasopharyngeal swab specimens and  should not be used as a sole basis for treatment. Nasal washings and  aspirates are unacceptable for Xpert Xpress SARS-CoV-2/FLU/RSV  testing.  Fact Sheet for Patients: PinkCheek.be  Fact Sheet for Healthcare Providers: GravelBags.it  This test is not yet approved or cleared by the [place] FDA and  has been authorized for detection and/or diagnosis of SARS-CoV-2 by  FDA under an Emergency Use Authorization (EUA). This EUA will remain  in effect (meaning this test can be used) for the duration of the  Covid-19 declaration under Section 564(b)(1) of the Act, 21  U.S.C. section 360bbb-3(b)(1), unless the authorization is  terminated or revoked. Performed at [facility], [address], [place], [place] [address]   Aerobic/Anaerobic Culture (surgical/deep wound)     Status: None (Preliminary result)   Collection Time: 06/28/20  2:45 AM   Specimen: PATH Other; Tissue  Result Value Ref Range Status    Specimen Description   Final    PERITONEAL Performed at [facility], [address], [place], [place] [address]    Special Requests   Final    GASTRIC ULCER PREFORATION Performed at [facility], [address], [place], [place] [address]    Gram Stain   Final    RARE WBC PRESENT, PREDOMINANTLY PMN NO ORGANISMS SEEN Performed at [facility] [address] [place],  [address]    Culture PENDING  Incomplete   Report Status PENDING  Incomplete     Radiological Exams on Admission: CT ABDOMEN PELVIS WO CONTRAST  Result Date: 06/27/2020 CLINICAL DATA:  Abdominal pain EXAM: CT ABDOMEN AND PELVIS WITHOUT CONTRAST TECHNIQUE: Multidetector CT imaging of the abdomen and pelvis was performed following the standard protocol without IV contrast. COMPARISON:  None. FINDINGS: Lower chest: Lung bases demonstrate mild emphysematous disease. No acute consolidation or effusion. Minimal subpleural fibrosis. Coronary vascular calcification. Hepatobiliary: No focal liver abnormality is seen. No gallstones, gallbladder wall  thickening, or biliary dilatation. Pancreas: Unremarkable. No pancreatic ductal dilatation or surrounding inflammatory changes. Spleen: Normal in size without focal abnormality. Adrenals/Urinary Tract: Adrenal glands are normal. Kidneys show no hydronephrosis. Multiple low-attenuation lesions within both kidneys favored to represent cysts though incompletely characterized without contrast. Slightly thick-walled urinary bladder. Stomach/Bowel: Wall thickening at the pre pyloric region of the stomach with suspected focal defect/perforation in the wall, coronal series 5, image number 23. No small bowel dilatation. Moderate stool in the colon. Negative appendix. 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 Vascular/Lymphatic: Moderate aortic atherosclerosis without aneurysm. Ectatic common iliac vessels measuring 1.6 cm on the right and 1.8 cm on the left. No  suspicious adenopathy. Reproductive: Slightly enlarged prostate with mass effect on the bladder Other: Moderate free air within the upper abdomen. Small amount of ascites within the abdomen and pelvis. Musculoskeletal: No acute or significant osseous findings. IMPRESSION: 1. Moderate free air in the upper abdomen with small amount of free fluid in the abdomen and pelvis consistent with hollow viscus perforation. Suspected source is the pre-pyloric region of the stomach where there is wall thickening and apparent defect, either representing perforated ulcer or perforated gastric mass. 2. 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 Slightly enlarged prostate. Bladder slightly thick walled which may be due to cystitis or chronic obstruction. Critical Value/emergent results were called by telephone at the time of interpretation on 06/27/2020 at 9:44 pm to provider JULIE HAVILAND , who verbally acknowledged these results. Aortic Atherosclerosis (ICD10-I70.0). Electronically Signed   By: Donavan Foil M.D.   On: 06/27/2020 21:44   DG CHEST PORT 1 VIEW  Result Date: 06/28/2020 CLINICAL DATA:  Shortness of breath EXAM: PORTABLE CHEST 1 VIEW COMPARISON:  Earlier same day FINDINGS: Endotracheal and partially imaged enteric tubes again identified. Stable interstitial prominence likely reflecting changes of COPD. No significant pleural effusion. No pneumothorax. Stable cardiomediastinal contours. IMPRESSION: Stable appearance. Interstitial prominence is nonspecific and could reflect chronic changes of COPD. Electronically Signed   By: Macy Mis M.D.   On: 06/28/2020 09:44   DG Chest Port 1 View  Result Date: 06/28/2020 CLINICAL DATA:  Intubation. EXAM: PORTABLE CHEST 1 VIEW COMPARISON:  One-view chest x-ray 06/27/2020 FINDINGS: Patient has been intubated. Endotracheal tube terminates 4.8 cm above the carina. Side port of the NG tube courses scratched at the side port of the NG tube is in the  fundus the stomach. The heart size is normal. Atherosclerotic calcifications are present at the aortic arch. Interstitial and airspace opacities have slightly increased diffusely. No significant airspace consolidation is present. No pneumothorax is present. IMPRESSION: 1. Interval intubation and placement of NG tube. Placement is satisfactory as described above. 2. Slight increase in interstitial and airspace disease diffusely. This is concerning for edema or infection. Electronically Signed   By: San Morelle M.D.   On: 06/28/2020 04:30   DG Chest Portable 1 View  Result Date: 06/27/2020 CLINICAL DATA:  Abdomen pain EXAM: PORTABLE CHEST 1 VIEW COMPARISON:  CT 06/27/2020 FINDINGS: The free air noted on CT is not well seen radiographically. No focal consolidation or effusion. Normal cardiomediastinal silhouette with aortic atherosclerosis. No pneumothorax. IMPRESSION: No active disease. The free air noted on CT is not well seen radiographically. Electronically Signed   By: Donavan Foil M.D.   On: 06/27/2020 22:44    EKG: Independently reviewed. Sinus, mobitz I block, no STT changes    Time spent:70 minutes Code Status:   FULL Family Communication:  No Family at  bedside Disposition Plan: expect 2-3 day hospitalization Consults called: PCCM DVT Prophylaxis: [place] Heparin     Orson Eva, DO  [facility] Pager [phone]  If 7PM-7AM, please contact night-coverage [email or web address] Password [email or web address] 06/28/2020, 2:10 PM

## 2020-06-28 NOTE — Progress Notes (Signed)
[facility] Progress Note  Day of Surgery  Subjective: Eventful day. Soft BP overnight, hyperkalemia, I obtained EKG to ensure no peaked T and demonstrated heart block. Cardiology and Hospitalist consulted for assistance. Pulmonary extubated patient who did well on spontaneous breathing trials. Hypotension at times and opted for CVL to be placed pending need for pressures. UOP has been about 15cc and Cr worsening.   Suanne Marker, Neighbor wants to be POA and patient wants this. Social work brought paperwork for them to fill out.   Objective: Vital signs in last 24 hours: Temp:  [97.4 F (36.3 C)-98 F (36.7 C)] 98 F (36.7 C) (10/26 1330) Pulse Rate:  [53-88] 61 (10/26 1730) Resp:  [7-23] 16 (10/26 1730) BP: (50-128)/(26-85) 125/42 (10/26 1730) SpO2:  [93 %-100 %] 100 % (10/26 1730) Arterial Line BP: (63-161)/(43-141) 161/141 (10/26 1600) FiO2 (%):  [35 %-50 %] 35 % (10/26 1214) Weight:  [63.5 kg] 63.5 kg (10/25 1945) Last BM Date:  (UTA)  Intake/Output from previous day: 10/25 0701 - 10/26 0700 In: 5840.1 [I.V.:3183.4; IV Piggyback:2656.7] Out: 275 [Urine:55; Drains:70; Blood:50] Intake/Output this shift: Total I/O In: 1633.6 [I.V.:705.1; IV Piggyback:928.5] Out: 370 [Urine:50; Drains:320]  General appearance: alert, cooperative and no distress Resp: normal work of breathing GI: soft, appropriately tender, JP with cloudy SS fluid, midline with honeycomb and no staining  Lab Results:  Recent Labs    06/27/20 2009 06/28/20 0522  WBC 8.7 4.3  HGB 12.3* 11.6*  HCT 38.2* 38.2*  PLT 392 316   BMET Recent Labs    06/28/20 0522 06/28/20 1016  NA 134* 132*  K 5.2* 5.3*  CL 104 106  CO2 21* 21*  GLUCOSE 107* 106*  BUN 56* 57*  CREATININE 2.52* 2.72*  CALCIUM 7.7* 7.2*   PT/INR No results for input(s): LABPROT, INR in the last 72 hours.  Studies/Results: CT ABDOMEN PELVIS WO CONTRAST  Result Date: 06/27/2020 CLINICAL DATA:  Abdominal pain EXAM: CT  ABDOMEN AND PELVIS WITHOUT CONTRAST TECHNIQUE: Multidetector CT imaging of the abdomen and pelvis was performed following the standard protocol without IV contrast. COMPARISON:  None. FINDINGS: Lower chest: Lung bases demonstrate mild emphysematous disease. No acute consolidation or effusion. Minimal subpleural fibrosis. Coronary vascular calcification. Hepatobiliary: No focal liver abnormality is seen. No gallstones, gallbladder wall thickening, or biliary dilatation. Pancreas: Unremarkable. No pancreatic ductal dilatation or surrounding inflammatory changes. Spleen: Normal in size without focal abnormality. Adrenals/Urinary Tract: Adrenal glands are normal. Kidneys show no hydronephrosis. Multiple low-attenuation lesions within both kidneys favored to represent cysts though incompletely characterized without contrast. Slightly thick-walled urinary bladder. Stomach/Bowel: Wall thickening at the pre pyloric region of the stomach with suspected focal defect/perforation in the wall, coronal series 5, image number 23. No small bowel dilatation. Moderate stool in the colon. Negative appendix. 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 Vascular/Lymphatic: Moderate aortic atherosclerosis without aneurysm. Ectatic common iliac vessels measuring 1.6 cm on the right and 1.8 cm on the left. No suspicious adenopathy. Reproductive: Slightly enlarged prostate with mass effect on the bladder Other: Moderate free air within the upper abdomen. Small amount of ascites within the abdomen and pelvis. Musculoskeletal: No acute or significant osseous findings. IMPRESSION: 1. Moderate free air in the upper abdomen with small amount of free fluid in the abdomen and pelvis consistent with hollow viscus perforation. Suspected source is the pre-pyloric region of the stomach where there is wall thickening and apparent defect, either representing perforated ulcer or perforated gastric mass. 2. Left colon diverticular  disease without acute inflammatory 3. Slightly enlarged prostate. Bladder slightly thick walled which may be due to cystitis or chronic obstruction. Critical Value/emergent results were called by telephone at the time of interpretation on 06/27/2020 at 9:44 pm to provider JULIE HAVILAND , who verbally acknowledged these results. Aortic Atherosclerosis (ICD10-I70.0). Electronically Signed   By: Donavan Foil M.D.   On: 06/27/2020 21:44   US RENAL  Result Date: 06/28/2020 CLINICAL DATA:  79 year old male with acute renal insufficiency. EXAM: RENAL / URINARY TRACT ULTRASOUND COMPLETE COMPARISON:  None. FINDINGS: Right Kidney: Renal measurements: 10.9 x 4.3 x 3.7 cm = volume: 85 mL. Mild parenchyma atrophy and increased echogenicity. No hydronephrosis or shadowing stone. There is a 3 cm upper pole cyst. Left Kidney: Renal measurements: 9.1 x 4.8 x 3.7 cm = volume: 85 mL. There is mild parenchyma atrophy and increased echogenicity. No hydronephrosis or shadowing stone. There is a 3 cm upper pole cyst and a 6 cm parapelvic cyst. Several way shin of the left kidney and cysts are limited due to suboptimal visualization, body habitus, and overlying bowel gas. Bladder: Not seen. Other: None. IMPRESSION: 1. Mildly atrophic and echogenic kidneys in keeping with chronic kidney disease. No hydronephrosis or shadowing stone. 2. Bilateral renal cysts, suboptimally evaluated due to body habitus and bowel gas. Electronically Signed   By: Anner Crete M.D.   On: 06/28/2020 16:13   DG Chest Port 1 View  Result Date: 06/28/2020 CLINICAL DATA:  Central catheter placement.  Hypoxia. EXAM: PORTABLE CHEST 1 VIEW COMPARISON:  June 28, 2020 study obtained earlier in the day FINDINGS: Central catheter tip is in the superior vena cava. Endotracheal tube tip is 6.0 cm above the carina. Nasogastric tube tip and side port are below the diaphragm. No pneumothorax. There is a small right pleural effusion. There is slight bibasilar  atelectasis. Lungs elsewhere are clear. Heart size and pulmonary vascularity are normal. No adenopathy. There is degenerative change in each shoulder. IMPRESSION: Tube and catheter positions as described without pneumothorax. Small right pleural effusion with slight bibasilar atelectasis. No edema or airspace opacity. Stable cardiac silhouette. Electronically Signed   By: Lowella Grip III M.D.   On: 06/28/2020 15:18   DG CHEST PORT 1 VIEW  Result Date: 06/28/2020 CLINICAL DATA:  Shortness of breath EXAM: PORTABLE CHEST 1 VIEW COMPARISON:  Earlier same day FINDINGS: Endotracheal and partially imaged enteric tubes again identified. Stable interstitial prominence likely reflecting changes of COPD. No significant pleural effusion. No pneumothorax. Stable cardiomediastinal contours. IMPRESSION: Stable appearance. Interstitial prominence is nonspecific and could reflect chronic changes of COPD. Electronically Signed   By: Macy Mis M.D.   On: 06/28/2020 09:44   DG Chest Port 1 View  Result Date: 06/28/2020 CLINICAL DATA:  Intubation. EXAM: PORTABLE CHEST 1 VIEW COMPARISON:  One-view chest x-ray 06/27/2020 FINDINGS: Patient has been intubated. Endotracheal tube terminates 4.8 cm above the carina. Side port of the NG tube courses scratched at the side port of the NG tube is in the fundus the stomach. The heart size is normal. Atherosclerotic calcifications are present at the aortic arch. Interstitial and airspace opacities have slightly increased diffusely. No significant airspace consolidation is present. No pneumothorax is present. IMPRESSION: 1. Interval intubation and placement of NG tube. Placement is satisfactory as described above. 2. Slight increase in interstitial and airspace disease diffusely. This is concerning for edema or infection. Electronically Signed   By: San Morelle M.D.   On: 06/28/2020 04:30   DG Chest Portable  1 View  Result Date: 06/27/2020 CLINICAL DATA:  Abdomen  pain EXAM: PORTABLE CHEST 1 VIEW COMPARISON:  CT 06/27/2020 FINDINGS: The free air noted on CT is not well seen radiographically. No focal consolidation or effusion. Normal cardiomediastinal silhouette with aortic atherosclerosis. No pneumothorax. IMPRESSION: No active disease. The free air noted on CT is not well seen radiographically. Electronically Signed   By: Donavan Foil M.D.   On: 06/27/2020 22:44    Anti-infectives: Anti-infectives (From admission, onward)   Start     Dose/Rate Route Frequency Ordered Stop   06/28/20 2200  piperacillin-tazobactam (ZOSYN) IVPB 3.375 g        3.375 g 12.5 mL/hr over 240 Minutes Intravenous Every 12 hours 06/28/20 1407 07/01/20 2359   06/28/20 0600  piperacillin-tazobactam (ZOSYN) IVPB 3.375 g  Status:  Discontinued        3.375 g 12.5 mL/hr over 240 Minutes Intravenous Every 8 hours 06/28/20 0412 06/28/20 1407   06/28/20 0130  cefoTEtan (CEFOTAN) 2 g in sodium chloride 0.9 % 100 mL IVPB        2 g 200 mL/hr over 30 Minutes Intravenous On call to O.R. 06/27/20 2323 06/28/20 0200   06/27/20 2215  piperacillin-tazobactam (ZOSYN) IVPB 3.375 g        3.375 g 100 mL/hr over 30 Minutes Intravenous  Once 06/27/20 2203 06/27/20 2305      Assessment/Plan: Mr. Rog is an 79 yo s/p perforated gastric ulcer s/p ex lap and graham patch. Extubated but now with heart block post op. Pulmonary/ critical care, cardiology, and hospitalist following now. Appreciate all of their support and assistance with patient. -PRN for pain -IS ordered post extubation -Heart block, CC recommending dopamine for HR > 60 and SBP > 90 for now -NPO, NG in place, will need UGI before feeding -Labs in AM, zosyn post op for contamination for 4 days, culture pending -Foley for AKI/ CK disease, UOP 15 cc / hr  -SCDs, heparin sq -CVL 10/26 pending pressor needs, Arterial line out as it was malfunctioning, go by Cuff pressure for now    LOS: 0 days    Virl Cagey 06/28/2020

## 2020-06-28 NOTE — Anesthesia Preprocedure Evaluation (Signed)
Anesthesia Evaluation  Patient identified by MRN, date of birth, ID band Patient awake    Reviewed: Allergy & Precautions, NPO status , Patient's Chart, lab work & pertinent test results  History of Anesthesia Complications Negative for: history of anesthetic complications  Airway Mallampati: II  TM Distance: >3 FB Neck ROM: Full    Dental  (+) Lower Dentures, Upper Dentures   Pulmonary Current Smoker and Patient abstained from smoking.,    Pulmonary exam normal breath sounds clear to auscultation       Cardiovascular Exercise Tolerance: Good Normal cardiovascular exam Rhythm:Regular Rate:Normal     Neuro/Psych negative neurological ROS  negative psych ROS   GI/Hepatic Neg liver ROS, Bowel perforation    Endo/Other  negative endocrine ROS  Renal/GU Renal disease (AKI)     Musculoskeletal   Abdominal   Peds  Hematology negative hematology ROS (+)   Anesthesia Other Findings   Reproductive/Obstetrics                             Anesthesia Physical Anesthesia Plan  ASA: III and emergent  Anesthesia Plan: General   Post-op Pain Management:    Induction: Intravenous and Rapid sequence  PONV Risk Score and Plan: 3 and Ondansetron and Dexamethasone  Airway Management Planned: Oral ETT  Additional Equipment:   Intra-op Plan:   Post-operative Plan: Possible Post-op intubation/ventilation  Informed Consent: I have reviewed the patients History and Physical, chart, labs and discussed the procedure including the risks, benefits and alternatives for the proposed anesthesia with the patient or authorized representative who has indicated his/her understanding and acceptance.       Plan Discussed with: CRNA and Surgeon  Anesthesia Plan Comments:         Anesthesia Quick Evaluation

## 2020-06-28 NOTE — Anesthesia Procedure Notes (Signed)
Arterial Line Insertion Start/End10/26/2021 1:45 AM, 06/28/2020 1:55 AM Performed by: Denese Killings, MD, anesthesiologist  Preanesthetic checklist: patient identified, IV checked, site marked, risks and benefits discussed, surgical consent, monitors and equipment checked, pre-op evaluation, timeout performed and anesthesia consent Left, radial was placed Catheter size: 20 G Hand hygiene performed  and maximum sterile barriers used  Allen's test indicative of satisfactory collateral circulation Attempts: 1 Procedure performed without using ultrasound guided technique. Following insertion, dressing applied. Post procedure assessment: normal  Patient tolerated the procedure well with no immediate complications.

## 2020-06-28 NOTE — Progress Notes (Signed)
CRITICAL VALUE ALERT  Critical Value:  Lactic acid 3.1  Date & Time Notied:  06/28/20 @ 1630.  Provider Notified: Tat, MD.  Orders Received/Actions taken: NS Bolus.

## 2020-06-29 ENCOUNTER — Encounter (HOSPITAL_COMMUNITY): Payer: Self-pay | Admitting: General Surgery

## 2020-06-29 ENCOUNTER — Inpatient Hospital Stay (HOSPITAL_COMMUNITY): Payer: Medicare Other

## 2020-06-29 DIAGNOSIS — K255 Chronic or unspecified gastric ulcer with perforation: Principal | ICD-10-CM

## 2020-06-29 DIAGNOSIS — E875 Hyperkalemia: Secondary | ICD-10-CM

## 2020-06-29 DIAGNOSIS — R0600 Dyspnea, unspecified: Secondary | ICD-10-CM

## 2020-06-29 DIAGNOSIS — R6521 Severe sepsis with septic shock: Secondary | ICD-10-CM

## 2020-06-29 DIAGNOSIS — I442 Atrioventricular block, complete: Secondary | ICD-10-CM

## 2020-06-29 LAB — CBC
HCT: 30.9 % — ABNORMAL LOW (ref 39.0–52.0)
Hemoglobin: 9.9 g/dL — ABNORMAL LOW (ref 13.0–17.0)
MCH: 31.1 pg (ref 26.0–34.0)
MCHC: 32 g/dL (ref 30.0–36.0)
MCV: 97.2 fL (ref 80.0–100.0)
Platelets: 294 10*3/uL (ref 150–400)
RBC: 3.18 MIL/uL — ABNORMAL LOW (ref 4.22–5.81)
RDW: 14.6 % (ref 11.5–15.5)
WBC: 12 10*3/uL — ABNORMAL HIGH (ref 4.0–10.5)
nRBC: 0 % (ref 0.0–0.2)

## 2020-06-29 LAB — ECHOCARDIOGRAM COMPLETE
AR max vel: 2.35 cm2
AV Area VTI: 2.23 cm2
AV Area mean vel: 2.29 cm2
AV Mean grad: 4.7 mmHg
AV Peak grad: 8.7 mmHg
Ao pk vel: 1.48 m/s
Area-P 1/2: 3.65 cm2
Height: 68 in
S' Lateral: 3.37 cm
Weight: 2240 oz

## 2020-06-29 LAB — BASIC METABOLIC PANEL
Anion gap: 10 (ref 5–15)
BUN: 55 mg/dL — ABNORMAL HIGH (ref 8–23)
CO2: 24 mmol/L (ref 22–32)
Calcium: 7.8 mg/dL — ABNORMAL LOW (ref 8.9–10.3)
Chloride: 103 mmol/L (ref 98–111)
Creatinine, Ser: 2.59 mg/dL — ABNORMAL HIGH (ref 0.61–1.24)
GFR, Estimated: 24 mL/min — ABNORMAL LOW (ref >60)
Glucose, Bld: 77 mg/dL (ref 70–99)
Potassium: 4.6 mmol/L (ref 3.5–5.1)
Sodium: 137 mmol/L (ref 135–145)

## 2020-06-29 LAB — COMPREHENSIVE METABOLIC PANEL
ALT: 21 U/L (ref 0–44)
AST: 27 U/L (ref 15–41)
Albumin: 2.2 g/dL — ABNORMAL LOW (ref 3.5–5.0)
Alkaline Phosphatase: 30 U/L — ABNORMAL LOW (ref 38–126)
Anion gap: 8 (ref 5–15)
BUN: 54 mg/dL — ABNORMAL HIGH (ref 8–23)
CO2: 26 mmol/L (ref 22–32)
Calcium: 7.7 mg/dL — ABNORMAL LOW (ref 8.9–10.3)
Chloride: 103 mmol/L (ref 98–111)
Creatinine, Ser: 2.4 mg/dL — ABNORMAL HIGH (ref 0.61–1.24)
GFR, Estimated: 27 mL/min — ABNORMAL LOW (ref >60)
Glucose, Bld: 84 mg/dL (ref 70–99)
Potassium: 4.4 mmol/L (ref 3.5–5.1)
Sodium: 137 mmol/L (ref 135–145)
Total Bilirubin: 0.7 mg/dL (ref 0.3–1.2)
Total Protein: 5.2 g/dL — ABNORMAL LOW (ref 6.5–8.1)

## 2020-06-29 LAB — TSH: TSH: 2.728 u[IU]/mL (ref 0.350–4.500)

## 2020-06-29 LAB — CORTISOL-AM, BLOOD: Cortisol - AM: 17.9 ug/dL (ref 6.7–22.6)

## 2020-06-29 LAB — PHOSPHORUS: Phosphorus: 4.3 mg/dL (ref 2.5–4.6)

## 2020-06-29 LAB — GLUCOSE, CAPILLARY
Glucose-Capillary: 115 mg/dL — ABNORMAL HIGH (ref 70–99)
Glucose-Capillary: 67 mg/dL — ABNORMAL LOW (ref 70–99)
Glucose-Capillary: 81 mg/dL (ref 70–99)
Glucose-Capillary: 72 mg/dL (ref 70–99)
Glucose-Capillary: 71 mg/dL (ref 70–99)
Glucose-Capillary: 83 mg/dL (ref 70–99)

## 2020-06-29 LAB — PROCALCITONIN: Procalcitonin: 49.03 ng/mL

## 2020-06-29 LAB — MAGNESIUM: Magnesium: 2.3 mg/dL (ref 1.7–2.4)

## 2020-06-29 LAB — PATHOLOGIST SMEAR REVIEW

## 2020-06-29 MED ORDER — DEXTROSE 50 % IV SOLN
INTRAVENOUS | Status: AC
  Filled 2020-06-29: qty 50

## 2020-06-29 MED ORDER — PIPERACILLIN-TAZOBACTAM 3.375 G IVPB
3.3750 g | Freq: Three times a day (TID) | INTRAVENOUS | Status: DC
  Administered 2020-06-29 – 2020-07-01 (×7): 3.375 g via INTRAVENOUS
  Filled 2020-06-29 (×8): qty 50

## 2020-06-29 MED ORDER — DEXTROSE-NACL 5-0.9 % IV SOLN: INTRAVENOUS | Status: AC

## 2020-06-29 MED ORDER — IPRATROPIUM-ALBUTEROL 0.5-2.5 (3) MG/3ML IN SOLN
3.0000 mL | Freq: Three times a day (TID) | RESPIRATORY_TRACT | Status: DC
  Administered 2020-06-29 – 2020-07-01 (×9): 3 mL via RESPIRATORY_TRACT
  Filled 2020-06-29 (×8): qty 3

## 2020-06-29 MED ORDER — DEXTROSE 50 % IV SOLN
12.5000 g | INTRAVENOUS | Status: AC
  Administered 2020-06-29: 12.5 g via INTRAVENOUS

## 2020-06-29 NOTE — Progress Notes (Addendum)
I was present with the medical student for this service. I personally verified the history of present illness, performed the physical exam, and made the plan for this encounter. I have verified the medical student's documentation and made modifications where appropriately. I have personally documented in my own words a brief history, physical, and plan below.     Doing well extubated. Is POD 1 from surgery. Ng came out due to being coiled in back of throat. Minimal output. JP with continued output. UOP improving Cr coming down.  NPO, no NG to be replaced, will hold Zosyn for 4 days post op UGI on Friday to see if ulcer sealed Will need EGD as outpatient to look at area and ensure no mass Will need to empirically treat for H pylori once on Po Given CVL present, can start TPN pending needing to keep it past Friday if a leak is present.  Appreciate everyone's assistance.  Curlene Labrum, MD [facility] [address] [place], [place] [address] [phone] (office)    [facility] Progress Note  1 Day Post-Op  Subjective: Pt is a 79 YO M w/ no documented PMH outside of tobacco use on POD#1 s/p Exp laparotomy with omental patch for perforated gastric ulcer. Pt doing well following NG tube being dislodged. Pt not yet eating or drinking.   Objective: Vital signs in last 24 hours: Temp:  [97.8 F (36.6 C)-98.5 F (36.9 C)] 97.8 F (36.6 C) (10/27 0811) Pulse Rate:  [41-92] 85 (10/27 0811) Resp:  [8-31] 20 (10/27 0811) BP: (68-203)/(35-103) 128/48 (10/27 0645) SpO2:  [93 %-100 %] 100 % (10/27 0941) Arterial Line BP: (67-161)/(49-141) 161/141 (10/26 1600) FiO2 (%):  [35 %] 35 % (10/26 1214) Last BM Date:  (UTA)  Intake/Output from previous day: 10/26 0701 - 10/27 0700 In: 2495.4 [I.V.:1526.3; IV Piggyback:969.1] Out: 1815 [Urine:1275; Drains:540] Intake/Output this shift: No intake/output data recorded.  General appearance:  alert, cooperative and no distress Head: Normocephalic, without obvious abnormality, atraumatic Resp: Normal WOB GI: soft, non-distended Incision/Wound:C/D/I no swelling, erythema, or induration appreciated   Lab Results:  Recent Labs    06/28/20 0522 06/29/20 0437  WBC 4.3 12.0*  HGB 11.6* 9.9*  HCT 38.2* 30.9*  PLT 316 294   BMET Recent Labs    06/29/20 0101 06/29/20 0437  NA 137 137  K 4.6 4.4  CL 103 103  CO2 24 26  GLUCOSE 77 84  BUN 55* 54*  CREATININE 2.59* 2.40*  CALCIUM 7.8* 7.7*   PT/INR No results for input(s): LABPROT, INR in the last 72 hours.  Studies/Results: CT ABDOMEN PELVIS WO CONTRAST  Result Date: 06/27/2020 CLINICAL DATA:  Abdominal pain EXAM: CT ABDOMEN AND PELVIS WITHOUT CONTRAST TECHNIQUE: Multidetector CT imaging of the abdomen and pelvis was performed following the standard protocol without IV contrast. COMPARISON:  None. FINDINGS: Lower chest: Lung bases demonstrate mild emphysematous disease. No acute consolidation or effusion. Minimal subpleural fibrosis. Coronary vascular calcification. Hepatobiliary: No focal liver abnormality is seen. No gallstones, gallbladder wall thickening, or biliary dilatation. Pancreas: Unremarkable. No pancreatic ductal dilatation or surrounding inflammatory changes. Spleen: Normal in size without focal abnormality. Adrenals/Urinary Tract: Adrenal glands are normal. Kidneys show no hydronephrosis. Multiple low-attenuation lesions within both kidneys favored to represent cysts though incompletely characterized without contrast. Slightly thick-walled urinary bladder. Stomach/Bowel: Wall thickening at the pre pyloric region of the stomach with suspected focal defect/perforation in the wall, coronal series 5, image number 23. No small bowel dilatation. Moderate stool in  the colon. Negative appendix. 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 Vascular/Lymphatic: Moderate aortic atherosclerosis without  aneurysm. Ectatic common iliac vessels measuring 1.6 cm on the right and 1.8 cm on the left. No suspicious adenopathy. Reproductive: Slightly enlarged prostate with mass effect on the bladder Other: Moderate free air within the upper abdomen. Small amount of ascites within the abdomen and pelvis. Musculoskeletal: No acute or significant osseous findings. IMPRESSION: 1. Moderate free air in the upper abdomen with small amount of free fluid in the abdomen and pelvis consistent with hollow viscus perforation. Suspected source is the pre-pyloric region of the stomach where there is wall thickening and apparent defect, either representing perforated ulcer or perforated gastric mass. 2. 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 Slightly enlarged prostate. Bladder slightly thick walled which may be due to cystitis or chronic obstruction. Critical Value/emergent results were called by telephone at the time of interpretation on 06/27/2020 at 9:44 pm to provider JULIE HAVILAND , who verbally acknowledged these results. Aortic Atherosclerosis (ICD10-I70.0). Electronically Signed   By: Donavan Foil M.D.   On: 06/27/2020 21:44   US RENAL  Result Date: 06/28/2020 CLINICAL DATA:  79 year old male with acute renal insufficiency. EXAM: RENAL / URINARY TRACT ULTRASOUND COMPLETE COMPARISON:  None. FINDINGS: Right Kidney: Renal measurements: 10.9 x 4.3 x 3.7 cm = volume: 85 mL. Mild parenchyma atrophy and increased echogenicity. No hydronephrosis or shadowing stone. There is a 3 cm upper pole cyst. Left Kidney: Renal measurements: 9.1 x 4.8 x 3.7 cm = volume: 85 mL. There is mild parenchyma atrophy and increased echogenicity. No hydronephrosis or shadowing stone. There is a 3 cm upper pole cyst and a 6 cm parapelvic cyst. Several way shin of the left kidney and cysts are limited due to suboptimal visualization, body habitus, and overlying bowel gas. Bladder: Not seen. Other: None. IMPRESSION: 1. Mildly  atrophic and echogenic kidneys in keeping with chronic kidney disease. No hydronephrosis or shadowing stone. 2. Bilateral renal cysts, suboptimally evaluated due to body habitus and bowel gas. Electronically Signed   By: Anner Crete M.D.   On: 06/28/2020 16:13   DG Chest Port 1 View  Result Date: 06/28/2020 CLINICAL DATA:  Central catheter placement.  Hypoxia. EXAM: PORTABLE CHEST 1 VIEW COMPARISON:  June 28, 2020 study obtained earlier in the day FINDINGS: Central catheter tip is in the superior vena cava. Endotracheal tube tip is 6.0 cm above the carina. Nasogastric tube tip and side port are below the diaphragm. No pneumothorax. There is a small right pleural effusion. There is slight bibasilar atelectasis. Lungs elsewhere are clear. Heart size and pulmonary vascularity are normal. No adenopathy. There is degenerative change in each shoulder. IMPRESSION: Tube and catheter positions as described without pneumothorax. Small right pleural effusion with slight bibasilar atelectasis. No edema or airspace opacity. Stable cardiac silhouette. Electronically Signed   By: Lowella Grip III M.D.   On: 06/28/2020 15:18   DG CHEST PORT 1 VIEW  Result Date: 06/28/2020 CLINICAL DATA:  Shortness of breath EXAM: PORTABLE CHEST 1 VIEW COMPARISON:  Earlier same day FINDINGS: Endotracheal and partially imaged enteric tubes again identified. Stable interstitial prominence likely reflecting changes of COPD. No significant pleural effusion. No pneumothorax. Stable cardiomediastinal contours. IMPRESSION: Stable appearance. Interstitial prominence is nonspecific and could reflect chronic changes of COPD. Electronically Signed   By: Macy Mis M.D.   On: 06/28/2020 09:44   DG Chest Port 1 View  Result Date: 06/28/2020 CLINICAL DATA:  Intubation. EXAM:  PORTABLE CHEST 1 VIEW COMPARISON:  One-view chest x-ray 06/27/2020 FINDINGS: Patient has been intubated. Endotracheal tube terminates 4.8 cm above the carina.  Side port of the NG tube courses scratched at the side port of the NG tube is in the fundus the stomach. The heart size is normal. Atherosclerotic calcifications are present at the aortic arch. Interstitial and airspace opacities have slightly increased diffusely. No significant airspace consolidation is present. No pneumothorax is present. IMPRESSION: 1. Interval intubation and placement of NG tube. Placement is satisfactory as described above. 2. Slight increase in interstitial and airspace disease diffusely. This is concerning for edema or infection. Electronically Signed   By: San Morelle M.D.   On: 06/28/2020 04:30   DG Chest Portable 1 View  Result Date: 06/27/2020 CLINICAL DATA:  Abdomen pain EXAM: PORTABLE CHEST 1 VIEW COMPARISON:  CT 06/27/2020 FINDINGS: The free air noted on CT is not well seen radiographically. No focal consolidation or effusion. Normal cardiomediastinal silhouette with aortic atherosclerosis. No pneumothorax. IMPRESSION: No active disease. The free air noted on CT is not well seen radiographically. Electronically Signed   By: Donavan Foil M.D.   On: 06/27/2020 22:44    Anti-infectives: Anti-infectives (From admission, onward)   Start     Dose/Rate Route Frequency Ordered Stop   06/29/20 1000  piperacillin-tazobactam (ZOSYN) IVPB 3.375 g        3.375 g 12.5 mL/hr over 240 Minutes Intravenous Every 8 hours 06/29/20 0907 07/02/20 0159   06/28/20 2200  piperacillin-tazobactam (ZOSYN) IVPB 3.375 g  Status:  Discontinued        3.375 g 12.5 mL/hr over 240 Minutes Intravenous Every 12 hours 06/28/20 1407 06/29/20 0907   06/28/20 0600  piperacillin-tazobactam (ZOSYN) IVPB 3.375 g  Status:  Discontinued        3.375 g 12.5 mL/hr over 240 Minutes Intravenous Every 8 hours 06/28/20 0412 06/28/20 1407   06/28/20 0130  cefoTEtan (CEFOTAN) 2 g in sodium chloride 0.9 % 100 mL IVPB        2 g 200 mL/hr over 30 Minutes Intravenous On call to O.R. 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 Pain controlled, physical exam reassuring, urine output and JP drainage WNL, and labs stable w/ no new imaging today.    Plan as follows: - Upper GI Friday - Do not replace NG tube, no oral intake - TPN starting tomorrow - DVT prophylaxis sQ Heparin, SCDs - Remove foley    LOS: 1 day    Raliegh Ip 06/29/2020

## 2020-06-29 NOTE — Progress Notes (Signed)
*  PRELIMINARY RESULTS* Echocardiogram 2D Echocardiogram has been performed.  Leavy Cella 06/29/2020, 11:15 AM

## 2020-06-29 NOTE — Progress Notes (Addendum)
Progress Note  Patient Name: Frederick [name] Date of Encounter: 06/29/2020  [facility] Cardiologist: Bea Graff  Subjective   Extubated yesterday. No complaints this AM  Inpatient Medications    Scheduled Meds: . chlorhexidine gluconate (MEDLINE KIT)  15 mL Mouth Rinse BID  . Chlorhexidine Gluconate Cloth  6 each Topical Daily  . heparin injection (subcutaneous)  5,000 Units Subcutaneous Q8H  . mouth rinse  15 mL Mouth Rinse BID  . pantoprazole (PROTONIX) IV  40 mg Intravenous Q12H   Continuous Infusions: . sodium chloride 100 mL/hr at 06/29/20 0447  . DOPamine 4 mcg/kg/min (06/29/20 0300)  . piperacillin-tazobactam (ZOSYN)  IV Stopped (06/29/20 0117)   PRN Meds: diphenhydrAMINE **OR** diphenhydrAMINE, metoprolol tartrate, morphine injection, ondansetron **OR** ondansetron (ZOFRAN) IV   Vital Signs    Vitals:   06/29/20 0445 06/29/20 0530 06/29/20 0630 06/29/20 0645  BP: (!) 129/58 (!) 104/50 (!) 134/48 (!) 128/48  Pulse: 86 84 80 80  Resp: 20 (!) _0 Temp:      TempSrc:      SpO2: 99% 100% 99% 99%  Weight:      Height:        Intake/Output Summary (Last 24 hours) at 06/29/2020 0759 Last data filed at 06/29/2020 0447 Gross per 24 hour  Intake 2495.35 ml  Output 1815 ml  Net 680.35 ml   Last 3 Weights 06/27/2020  Weight (lbs) 140 lb  Weight (kg) 63.504 kg      Telemetry    SR with long PR, intermittent complete heart block - Personally Reviewed  ECG    n/a - Personally Reviewed  Physical Exam   GEN: No acute distress.   Neck: No JVD Cardiac: RRR, no murmurs, rubs, or gallops.  Respiratory: Clear to auscultation bilaterally. GI: Soft, nontender, non-distended  MS: No edema; No deformity. Neuro:  Nonfocal  Psych: Normal affect   Labs    High Sensitivity Troponin:   Recent Labs  Lab 06/28/20 1322 06/28/20 1521  TROPONINIHS 18* 21*      Chemistry Recent Labs  Lab 06/27/20 2009 06/28/20 0522 06/28/20 1652 06/29/20 0101  06/29/20 0437  NA 135   < > 135 137 137  K 4.4   < > 5.6* 4.6 4.4  CL 97*   < > 101 103 103  CO2 25   < > 21* 24 26  GLUCOSE 150*   < > 96 77 84  BUN 59*   < > 58* 55* 54*  CREATININE 2.42*   < > 2.99* 2.59* 2.40*  CALCIUM 8.9   < > 7.7* 7.8* 7.7*  PROT 7.2  --   --   --  5.2*  ALBUMIN 3.7  --   --   --  2.2*  AST 21  --   --   --  27  ALT 14  --   --   --  21  ALKPHOS 47  --   --   --  30*  BILITOT 1.1  --   --   --  0.7  GFRNONAA 26*   < > 20* 24* 27*  ANIONGAP 13   < > _1 < > = values in this interval not displayed.     Hematology Recent Labs  Lab 06/27/20 2009 06/28/20 0522 06/29/20 0437  WBC 8.7 4.3 12.0*  RBC 3.93* 3.69* 3.18*  HGB 12.3* 11.6* 9.9*  HCT 38.2* 38.2* 30.9*  MCV 97.2 103.5* 97.2  MCH 31.3  31.4 31.1  MCHC 32.2 30.4 32.0  RDW 14.6 14.8 14.6  PLT 392 316 294    BNPNo results for input(s): BNP, PROBNP in the last 168 hours.   DDimer No results for input(s): DDIMER in the last 168 hours.   Radiology    CT ABDOMEN PELVIS WO CONTRAST  Result Date: 06/27/2020 CLINICAL DATA:  Abdominal pain EXAM: CT ABDOMEN AND PELVIS WITHOUT CONTRAST TECHNIQUE: Multidetector CT imaging of the abdomen and pelvis was performed following the standard protocol without IV contrast. COMPARISON:  None. FINDINGS: Lower chest: Lung bases demonstrate mild emphysematous disease. No acute consolidation or effusion. Minimal subpleural fibrosis. Coronary vascular calcification. Hepatobiliary: No focal liver abnormality is seen. No gallstones, gallbladder wall thickening, or biliary dilatation. Pancreas: Unremarkable. No pancreatic ductal dilatation or surrounding inflammatory changes. Spleen: Normal in size without focal abnormality. Adrenals/Urinary Tract: Adrenal glands are normal. Kidneys show no hydronephrosis. Multiple low-attenuation lesions within both kidneys favored to represent cysts though incompletely characterized without contrast. Slightly thick-walled urinary  bladder. Stomach/Bowel: Wall thickening at the pre pyloric region of the stomach with suspected focal defect/perforation in the wall, coronal series 5, image number 23. No small bowel dilatation. Moderate stool in the colon. Negative appendix. 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 Vascular/Lymphatic: Moderate aortic atherosclerosis without aneurysm. Ectatic common iliac vessels measuring 1.6 cm on the right and 1.8 cm on the left. No suspicious adenopathy. Reproductive: Slightly enlarged prostate with mass effect on the bladder Other: Moderate free air within the upper abdomen. Small amount of ascites within the abdomen and pelvis. Musculoskeletal: No acute or significant osseous findings. IMPRESSION: 1. Moderate free air in the upper abdomen with small amount of free fluid in the abdomen and pelvis consistent with hollow viscus perforation. Suspected source is the pre-pyloric region of the stomach where there is wall thickening and apparent defect, either representing perforated ulcer or perforated gastric mass. 2. 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 Slightly enlarged prostate. Bladder slightly thick walled which may be due to cystitis or chronic obstruction. Critical Value/emergent results were called by telephone at the time of interpretation on 06/27/2020 at 9:44 pm to provider JULIE HAVILAND , who verbally acknowledged these results. Aortic Atherosclerosis (ICD10-I70.0). Electronically Signed   By: Donavan Foil M.D.   On: 06/27/2020 21:44   US RENAL  Result Date: 06/28/2020 CLINICAL DATA:  79 year old male with acute renal insufficiency. EXAM: RENAL / URINARY TRACT ULTRASOUND COMPLETE COMPARISON:  None. FINDINGS: Right Kidney: Renal measurements: 10.9 x 4.3 x 3.7 cm = volume: 85 mL. Mild parenchyma atrophy and increased echogenicity. No hydronephrosis or shadowing stone. There is a 3 cm upper pole cyst. Left Kidney: Renal measurements: 9.1 x 4.8 x  3.7 cm = volume: 85 mL. There is mild parenchyma atrophy and increased echogenicity. No hydronephrosis or shadowing stone. There is a 3 cm upper pole cyst and a 6 cm parapelvic cyst. Several way shin of the left kidney and cysts are limited due to suboptimal visualization, body habitus, and overlying bowel gas. Bladder: Not seen. Other: None. IMPRESSION: 1. Mildly atrophic and echogenic kidneys in keeping with chronic kidney disease. No hydronephrosis or shadowing stone. 2. Bilateral renal cysts, suboptimally evaluated due to body habitus and bowel gas. Electronically Signed   By: Anner Crete M.D.   On: 06/28/2020 16:13   DG Chest Port 1 View  Result Date: 06/28/2020 CLINICAL DATA:  Central catheter placement.  Hypoxia. EXAM: PORTABLE CHEST 1 VIEW COMPARISON:  June 28, 2020 study obtained  earlier in the day FINDINGS: Central catheter tip is in the superior vena cava. Endotracheal tube tip is 6.0 cm above the carina. Nasogastric tube tip and side port are below the diaphragm. No pneumothorax. There is a small right pleural effusion. There is slight bibasilar atelectasis. Lungs elsewhere are clear. Heart size and pulmonary vascularity are normal. No adenopathy. There is degenerative change in each shoulder. IMPRESSION: Tube and catheter positions as described without pneumothorax. Small right pleural effusion with slight bibasilar atelectasis. No edema or airspace opacity. Stable cardiac silhouette. Electronically Signed   By: Lowella Grip III M.D.   On: 06/28/2020 15:18   DG CHEST PORT 1 VIEW  Result Date: 06/28/2020 CLINICAL DATA:  Shortness of breath EXAM: PORTABLE CHEST 1 VIEW COMPARISON:  Earlier same day FINDINGS: Endotracheal and partially imaged enteric tubes again identified. Stable interstitial prominence likely reflecting changes of COPD. No significant pleural effusion. No pneumothorax. Stable cardiomediastinal contours. IMPRESSION: Stable appearance. Interstitial prominence is  nonspecific and could reflect chronic changes of COPD. Electronically Signed   By: Macy Mis M.D.   On: 06/28/2020 09:44   DG Chest Port 1 View  Result Date: 06/28/2020 CLINICAL DATA:  Intubation. EXAM: PORTABLE CHEST 1 VIEW COMPARISON:  One-view chest x-ray 06/27/2020 FINDINGS: Patient has been intubated. Endotracheal tube terminates 4.8 cm above the carina. Side port of the NG tube courses scratched at the side port of the NG tube is in the fundus the stomach. The heart size is normal. Atherosclerotic calcifications are present at the aortic arch. Interstitial and airspace opacities have slightly increased diffusely. No significant airspace consolidation is present. No pneumothorax is present. IMPRESSION: 1. Interval intubation and placement of NG tube. Placement is satisfactory as described above. 2. Slight increase in interstitial and airspace disease diffusely. This is concerning for edema or infection. Electronically Signed   By: San Morelle M.D.   On: 06/28/2020 04:30   DG Chest Portable 1 View  Result Date: 06/27/2020 CLINICAL DATA:  Abdomen pain EXAM: PORTABLE CHEST 1 VIEW COMPARISON:  CT 06/27/2020 FINDINGS: The free air noted on CT is not well seen radiographically. No focal consolidation or effusion. Normal cardiomediastinal silhouette with aortic atherosclerosis. No pneumothorax. IMPRESSION: No active disease. The free air noted on CT is not well seen radiographically. Electronically Signed   By: Donavan Foil M.D.   On: 06/27/2020 22:44    Cardiac Studies     Patient Profile     Earvin Blazier is a 79 y.o. male with no clear past medical history who is being seen today for the evaluation of heart blocker at the request of Dr Constance Haw.  Assessment & Plan    1. Heart block - initial preop EKG sinus with very long first degree av block, LAFB. No prior EKGs to compare, has not seen medical providers in several years. Clear evidence of chronic conduction disease based on  baseline EKG - postop EKG consistent with complete heart block, stable junctional rhythm in 60s to 80s. Perhaps variable chronic conduction disease vs increased vagal tone from abdominal surgery, acute abdominal process - with stable accelerated junctional rhythm no indication for temp pacing. Certaintly with gastric perforation on antibiotics would look to avoid any permanent pacer placement.  -has been on dopamine more so for hypotension - tele shows mostly SR with long PR interval, intermittent complete heart block - reassess heart rates as he recovers from his severe acute systemic illness, he has had no indication for acute/emergent pacing. At home no symptoms of  dizziness or syncope.    2. Septic shock - in setting of gastric perforation likely sepsis, procalcitonin very elevated at 58 with lactic acid 3.1 - in absence of bradycardia his heart block is not playing a significant role in his hypotension - bp's much improved this AM, wean dopamine.    3. Renal failure - unclear baseline, unknown if acute failure in setting of gastric perforation and hypotension - keep MAPs above 65 - improving Cr with increased perfusion with IVFs and pressors  4. Hyperkalemia - fairly mild yesterday, I don't think playing a significant role in his heart block but would look to keep electrolytes normalized.    For questions or updates, please contact [place] Please consult [email or web address] for contact info under        Signed, Carlyle Dolly, MD  06/29/2020, 7:59 AM

## 2020-06-29 NOTE — Progress Notes (Signed)
PROGRESS NOTE   Frederick [name]  OZD:[id number] DOB: 02-Apr-1941 DOA: 06/27/2020 PCP: Patient, No Pcp Per   Chief Complaint  Patient presents with  . Abdominal Pain    Brief Admission History:  79 y.o. male with no documented chronic medical problems presenting with 2-week history of abdominal pain that was intermittent in nature, occasionally worse with food.  The patient has not seen a physician for nearly 5 years.  He does not take any prescription medications.  Apparently, the patient ate some chocolate on 06/27/2020 after which he had unrelenting epigastric abdominal pain.  There was some nausea without any emesis.  The patient denies any NSAIDs.  Because of his abdominal pain, the patient presented for further evaluation.  He had denied any chest pain, shortness breath, diarrhea, dysuria.  In the emergency department, the patient was afebrile with soft blood pressures.  CT of the abdomen and pelvis showed moderate free air in the upper abdomen with a small amount of free fluid consistent with perforated viscus.  It was suspected that this was likely in the prepyloric region of the stomach.  General surgery was consulted.  The patient was taken to the operating room and underwent an exploratory laparotomy with vascular omental patch placement.  Unfortunately, there was difficulty extubating the patient postoperatively.  On the morning of 06/28/2020, the patient began developing hypotension.  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    Assessment & Plan:   Principal Problem:   Gastric perforation, acute Active Problems:   Gastric perforation (HCC)   Acute respiratory failure with hypoxia (HCC)   Hyperkalemia   AKI (acute kidney injury) ([place])   Complete heart block (HCC)   Hypotension  1. POD#1 s/p Exp laparotomy with omental patch for perforated gastric ulcer - Pt is recovering well postop, blood pressures improving, he is now extubated.   2. Hypotension - he was  briefly on dobutamine infusion which is now being weaned. BPs are much improved with IV fluid hydration.   3. Acute respiratory failure with hypoxia - Pt has now been extubated and on nasal cannula.  PCCM consulted.  He is improving.  4. Hyperkalemia - treated aggressively and resolved now, following.  5. AV block - Appreciate cardiology consultation. For now patient is having no bradycardia. Follow.  6. 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  Following.    DVT prophylaxis: sQ heparin  Code Status:  Full  Family Communication:  Disposition: TBD   Status is: Inpatient  Remains inpatient appropriate because:Hemodynamically unstable, Persistent severe electrolyte disturbances, IV treatments appropriate due to intensity of illness or inability to take PO and Inpatient level of care appropriate due to severity of illness   Dispo: The patient is from: Home              Anticipated d/c is to: TBD              Anticipated d/c date is: 3 days              Patient currently is not medically stable to d/c.  Consultants:   Cardiology  pccm  Surgery   Procedures:   Central line placement 10/26  Exp lap with omental patch 10/26  Antimicrobials:  Zosyn 10/26>>  Subjective: Pt c/o incisional pain.  No flatus.    Objective: Vitals:   06/29/20 0530 06/29/20 0630 06/29/20 0645 06/29/20 0811  BP: (!) 104/50 (!) 134/48 Marland Kitchen)  128/48   Pulse: 84 80 80 85  Resp: (!) 21 13 13 20   Temp:    97.8 F (36.6 C)  TempSrc:    Oral  SpO2: 100% 99% 99% 99%  Weight:      Height:        Intake/Output Summary (Last 24 hours) at 06/29/2020 0906 Last data filed at 06/29/2020 0447 Gross per 24 hour  Intake 2495.35 ml  Output 1575 ml  Net 920.35 ml   Filed Weights   06/27/20 1945  Weight: 63.5 kg   Examination:  General exam: Appears calm and comfortable  Respiratory system: Clear to auscultation. Respiratory effort  normal. Cardiovascular system: normal S1 & S2 heard. No JVD, murmurs, rubs, gallops or clicks. No pedal edema. Gastrointestinal system: Abdomen is nondistended, soft and light incisional tenderness, wound clean and dry and intact. No organomegaly or masses felt. hypoactive BS.  Central nervous system: Alert and oriented. No focal neurological deficits. Extremities: Symmetric 5 x 5 power. Skin: No rashes, lesions or ulcers Psychiatry: Judgement and insight appear normal. Mood & affect appropriate.   Data Reviewed: I have personally reviewed following labs and imaging studies  CBC: Recent Labs  Lab 06/27/20 2009 06/28/20 0522 06/29/20 0437  WBC 8.7 4.3 12.0*  NEUTROABS 7.7 2.5  --   HGB 12.3* 11.6* 9.9*  HCT 38.2* 38.2* 30.9*  MCV 97.2 103.5* 97.2  PLT 392 316 563    Basic Metabolic Panel: Recent Labs  Lab 06/28/20 0522 06/28/20 1016 06/28/20 1652 06/29/20 0101 06/29/20 0437  NA 134* 132* 135 137 137  K 5.2* 5.3* 5.6* 4.6 4.4  CL 104 106 101 103 103  CO2 21* 21* 21* 24 26  GLUCOSE 107* 106* 96 77 84  BUN 56* 57* 58* 55* 54*  CREATININE 2.52* 2.72* 2.99* 2.59* 2.40*  CALCIUM 7.7* 7.2* 7.7* 7.8* 7.7*  MG 2.5*  --   --   --  2.3  PHOS 5.1*  --   --   --  4.3    GFR: Estimated Creatinine Clearance: 22 mL/min (A) (by C-G formula based on SCr of 2.4 mg/dL (H)).  Liver Function Tests: Recent Labs  Lab 06/27/20 2009 06/29/20 0437  AST 21 27  ALT 14 21  ALKPHOS 47 30*  BILITOT 1.1 0.7  PROT 7.2 5.2*  ALBUMIN 3.7 2.2*    CBG: Recent Labs  Lab 06/28/20 1643 06/28/20 2002 06/29/20 0102 06/29/20 0128 06/29/20 0437  GLUCAP 97 84 67* 115* 81    Recent Results (from the past 240 hour(s))  Respiratory Panel by RT PCR (Flu A&B, Covid) - Nasopharyngeal Swab     Status: None   Collection Time: 06/27/20  9:48 PM   Specimen: Nasopharyngeal Swab  Result Value Ref Range Status   SARS Coronavirus 2 by RT PCR NEGATIVE NEGATIVE Final    Comment: (NOTE) SARS-CoV-2  target nucleic acids are NOT DETECTED.  The SARS-CoV-2 RNA is generally detectable in upper respiratoy specimens during the acute phase of infection. The lowest concentration of SARS-CoV-2 viral copies this assay can detect is 131 copies/mL. A negative result does not preclude SARS-Cov-2 infection and should not be used as the sole basis for treatment or other patient management decisions. A negative result may occur with  improper specimen collection/handling, submission of specimen other than nasopharyngeal swab, presence of viral mutation(s) within the areas targeted by this assay, and inadequate number of viral copies (<131 copies/mL). A negative result must be combined with clinical observations, patient history, and  epidemiological information. The expected result is Negative.  Fact Sheet for Patients:  PinkCheek.be  Fact Sheet for Healthcare Providers:  GravelBags.it  This test is no t yet approved or cleared by the [place] FDA and  has been authorized for detection and/or diagnosis of SARS-CoV-2 by FDA under an Emergency Use Authorization (EUA). This EUA will remain  in effect (meaning this test can be used) for the duration of the COVID-19 declaration under Section 564(b)(1) of the Act, 21 U.S.C. section 360bbb-3(b)(1), unless the authorization is terminated or revoked sooner.     Influenza A by PCR NEGATIVE NEGATIVE Final   Influenza B by PCR NEGATIVE NEGATIVE Final    Comment: (NOTE) The Xpert Xpress SARS-CoV-2/FLU/RSV assay is intended as an aid in  the diagnosis of influenza from Nasopharyngeal swab specimens and  should not be used as a sole basis for treatment. Nasal washings and  aspirates are unacceptable for Xpert Xpress SARS-CoV-2/FLU/RSV  testing.  Fact Sheet for Patients: PinkCheek.be  Fact Sheet for Healthcare  Providers: GravelBags.it  This test is not yet approved or cleared by the [place] FDA and  has been authorized for detection and/or diagnosis of SARS-CoV-2 by  FDA under an Emergency Use Authorization (EUA). This EUA will remain  in effect (meaning this test can be used) for the duration of the  Covid-19 declaration under Section 564(b)(1) of the Act, 21  U.S.C. section 360bbb-3(b)(1), unless the authorization is  terminated or revoked. Performed at [facility], [address] [place], [place] [address]   Aerobic/Anaerobic Culture (surgical/deep wound)     Status: None (Preliminary result)   Collection Time: 06/28/20  2:45 AM   Specimen: PATH Other; Tissue  Result Value Ref Range Status   Specimen Description   Final    PERITONEAL Performed at [facility], [address], [place], [place] [address]    Special Requests   Final    GASTRIC ULCER PREFORATION Performed at [facility], [address], [place], [place] [address]    Gram Stain   Final    RARE WBC PRESENT, PREDOMINANTLY PMN NO ORGANISMS SEEN    Culture   Final    CULTURE REINCUBATED FOR BETTER GROWTH Performed at [facility] [address], [place], [place] [address]    Report Status PENDING  Incomplete  Culture, blood (Routine X 2) w Reflex to ID Panel     Status: None (Preliminary result)   Collection Time: 06/28/20  3:21 PM   Specimen: BLOOD  Result Value Ref Range Status   Specimen Description BLOOD  Final   Special Requests NONE  Final   Culture   Final    NO GROWTH < 24 HOURS Performed at [facility], [address], [place], [place] [address]    Report Status PENDING  Incomplete  Culture, blood (Routine X 2) w Reflex to ID Panel     Status: None (Preliminary result)   Collection Time: 06/28/20  3:21 PM   Specimen: BLOOD  Result Value Ref Range Status   Specimen Description BLOOD  Final   Special Requests NONE  Final   Culture   Final    NO GROWTH < 24  HOURS Performed at [facility], [address] [place],  [address]    Report Status PENDING  Incomplete     Radiology Studies: CT ABDOMEN PELVIS WO CONTRAST  Result Date: 06/27/2020 CLINICAL DATA:  Abdominal pain EXAM: CT ABDOMEN AND PELVIS WITHOUT CONTRAST TECHNIQUE: Multidetector CT imaging of  the abdomen and pelvis was performed following the standard protocol without IV contrast. COMPARISON:  None. FINDINGS: Lower chest: Lung bases demonstrate mild emphysematous disease. No acute consolidation or effusion. Minimal subpleural fibrosis. Coronary vascular calcification. Hepatobiliary: No focal liver abnormality is seen. No gallstones, gallbladder wall thickening, or biliary dilatation. Pancreas: Unremarkable. No pancreatic ductal dilatation or surrounding inflammatory changes. Spleen: Normal in size without focal abnormality. Adrenals/Urinary Tract: Adrenal glands are normal. Kidneys show no hydronephrosis. Multiple low-attenuation lesions within both kidneys favored to represent cysts though incompletely characterized without contrast. Slightly thick-walled urinary bladder. Stomach/Bowel: Wall thickening at the pre pyloric region of the stomach with suspected focal defect/perforation in the wall, coronal series 5, image number 23. No small bowel dilatation. Moderate stool in the colon. Negative appendix. 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 Vascular/Lymphatic: Moderate aortic atherosclerosis without aneurysm. Ectatic common iliac vessels measuring 1.6 cm on the right and 1.8 cm on the left. No suspicious adenopathy. Reproductive: Slightly enlarged prostate with mass effect on the bladder Other: Moderate free air within the upper abdomen. Small amount of ascites within the abdomen and pelvis. Musculoskeletal: No acute or significant osseous findings. IMPRESSION: 1. Moderate free air in the upper abdomen with small amount of free fluid in the abdomen and pelvis  consistent with hollow viscus perforation. Suspected source is the pre-pyloric region of the stomach where there is wall thickening and apparent defect, either representing perforated ulcer or perforated gastric mass. 2. 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 Slightly enlarged prostate. Bladder slightly thick walled which may be due to cystitis or chronic obstruction. Critical Value/emergent results were called by telephone at the time of interpretation on 06/27/2020 at 9:44 pm to provider JULIE HAVILAND , who verbally acknowledged these results. Aortic Atherosclerosis (ICD10-I70.0). Electronically Signed   By: Donavan Foil M.D.   On: 06/27/2020 21:44   US RENAL  Result Date: 06/28/2020 CLINICAL DATA:  79 year old male with acute renal insufficiency. EXAM: RENAL / URINARY TRACT ULTRASOUND COMPLETE COMPARISON:  None. FINDINGS: Right Kidney: Renal measurements: 10.9 x 4.3 x 3.7 cm = volume: 85 mL. Mild parenchyma atrophy and increased echogenicity. No hydronephrosis or shadowing stone. There is a 3 cm upper pole cyst. Left Kidney: Renal measurements: 9.1 x 4.8 x 3.7 cm = volume: 85 mL. There is mild parenchyma atrophy and increased echogenicity. No hydronephrosis or shadowing stone. There is a 3 cm upper pole cyst and a 6 cm parapelvic cyst. Several way shin of the left kidney and cysts are limited due to suboptimal visualization, body habitus, and overlying bowel gas. Bladder: Not seen. Other: None. IMPRESSION: 1. Mildly atrophic and echogenic kidneys in keeping with chronic kidney disease. No hydronephrosis or shadowing stone. 2. Bilateral renal cysts, suboptimally evaluated due to body habitus and bowel gas. Electronically Signed   By: Anner Crete M.D.   On: 06/28/2020 16:13   DG Chest Port 1 View  Result Date: 06/28/2020 CLINICAL DATA:  Central catheter placement.  Hypoxia. EXAM: PORTABLE CHEST 1 VIEW COMPARISON:  June 28, 2020 study obtained earlier in the day FINDINGS:  Central catheter tip is in the superior vena cava. Endotracheal tube tip is 6.0 cm above the carina. Nasogastric tube tip and side port are below the diaphragm. No pneumothorax. There is a small right pleural effusion. There is slight bibasilar atelectasis. Lungs elsewhere are clear. Heart size and pulmonary vascularity are normal. No adenopathy. There is degenerative change in each shoulder. IMPRESSION: Tube and catheter positions as described without pneumothorax.  Small right pleural effusion with slight bibasilar atelectasis. No edema or airspace opacity. Stable cardiac silhouette. Electronically Signed   By: Lowella Grip III M.D.   On: 06/28/2020 15:18   DG CHEST PORT 1 VIEW  Result Date: 06/28/2020 CLINICAL DATA:  Shortness of breath EXAM: PORTABLE CHEST 1 VIEW COMPARISON:  Earlier same day FINDINGS: Endotracheal and partially imaged enteric tubes again identified. Stable interstitial prominence likely reflecting changes of COPD. No significant pleural effusion. No pneumothorax. Stable cardiomediastinal contours. IMPRESSION: Stable appearance. Interstitial prominence is nonspecific and could reflect chronic changes of COPD. Electronically Signed   By: Macy Mis M.D.   On: 06/28/2020 09:44   DG Chest Port 1 View  Result Date: 06/28/2020 CLINICAL DATA:  Intubation. EXAM: PORTABLE CHEST 1 VIEW COMPARISON:  One-view chest x-ray 06/27/2020 FINDINGS: Patient has been intubated. Endotracheal tube terminates 4.8 cm above the carina. Side port of the NG tube courses scratched at the side port of the NG tube is in the fundus the stomach. The heart size is normal. Atherosclerotic calcifications are present at the aortic arch. Interstitial and airspace opacities have slightly increased diffusely. No significant airspace consolidation is present. No pneumothorax is present. IMPRESSION: 1. Interval intubation and placement of NG tube. Placement is satisfactory as described above. 2. Slight increase in  interstitial and airspace disease diffusely. This is concerning for edema or infection. Electronically Signed   By: San Morelle M.D.   On: 06/28/2020 04:30   DG Chest Portable 1 View  Result Date: 06/27/2020 CLINICAL DATA:  Abdomen pain EXAM: PORTABLE CHEST 1 VIEW COMPARISON:  CT 06/27/2020 FINDINGS: The free air noted on CT is not well seen radiographically. No focal consolidation or effusion. Normal cardiomediastinal silhouette with aortic atherosclerosis. No pneumothorax. IMPRESSION: No active disease. The free air noted on CT is not well seen radiographically. Electronically Signed   By: Donavan Foil M.D.   On: 06/27/2020 22:44     Scheduled Meds: . chlorhexidine gluconate (MEDLINE KIT)  15 mL Mouth Rinse BID  . Chlorhexidine Gluconate Cloth  6 each Topical Daily  . heparin injection (subcutaneous)  5,000 Units Subcutaneous Q8H  . ipratropium-albuterol  3 mL Nebulization TID  . mouth rinse  15 mL Mouth Rinse BID  . pantoprazole (PROTONIX) IV  40 mg Intravenous Q12H   Continuous Infusions: . sodium chloride 100 mL/hr at 06/29/20 0447  . DOPamine 4 mcg/kg/min (06/29/20 0300)  . piperacillin-tazobactam (ZOSYN)  IV Stopped (06/29/20 0117)     LOS: 1 day   Critical Care Procedure Note Authorized and Performed by: Murvin Natal MD  Total Critical Care time:  35 minutes  Due to a high probability of clinically significant, life threatening deterioration, the patient required my highest level of preparedness to intervene emergently and I personally spent this critical care time directly and personally managing the patient.  This critical care time included obtaining a history; examining the patient, pulse oximetry; ordering and review of studies; arranging urgent treatment with development of a management plan; evaluation of patient's response of treatment; frequent reassessment; and discussions with other providers.  This critical care time was performed to assess and manage the high  probability of imminent and life threatening deterioration that could result in multi-organ failure.  It was exclusive of separately billable procedures and treating other patients and teaching time.   Irwin Brakeman, MD How to contact the [facility] Attending or Consulting provider [place] or covering provider during after hours [place], for this patient?  1.  Check the care team in [facility] and look for a) attending/consulting [facility] provider listed and b) the [facility] team listed 2. Log into [email or web address] and use [place]'s universal password to access. If you do not have the password, please contact the hospital operator. 3. Locate the [facility] provider you are looking for under [facility] and page to a number that you can be directly reached. 4. If you still have difficulty reaching the provider, please page the [facility] (Director on Call) for the Hospitalists listed on amion for assistance.  06/29/2020, 9:06 AM

## 2020-06-29 NOTE — Progress Notes (Signed)
NAME:  Frederick [name], MRN:  [id number], DOB:  1940/11/12, LOS: 1 ADMISSION DATE:  06/27/2020, CONSULTATION DATE:  06/29/2020  REFERRING MD:  Tat, [facility], CHIEF COMPLAINT:  Intubated post surgery   Brief History   79 year old man admitted 10/25 with gastric perforation, required laparotomy and omental patch.  Postop course complicated by hypotension, AKI and complete heart block with junctional escape rhythm and mild hyperkalemia.  He was left intubated post procedure  History of present illness   79 year old man presented 10/25 as an acute abdomen.  History is obtained after review of the chart and speaking to bedside nurse.  CT showed free air in the abdomen consistent with perforated viscus.  He underwent exploratory laparotomy with omental patch placement for perforated stomach.  He was left intubated post procedure. Labs showed AKI with creatinine 2.4 range, no previous creatinine available.  On 10/20 6 AM he was mildly hypotensive, EKG showed complete heart block with junctional rhythm, cardiology was consulted.  We are asked by [facility] hospitalist to assist with ventilator management  Past [facility] Events   10/25 exploratory laparotomy with omental patch  Consults:  General surgery Cardiology   Procedures:  ETT 10/25 >> RIJ CVL 10/26 >>   Significant Diagnostic Tests:  CT abdomen/pelvis 10/25 >> free air, left colon diverticular disease, enlarged prostate US renal 10/26 >> mildly atrophic and echogenic kidneys suggestive of CKD, bilateral renal cysts  Micro Data:  Blood 10/25 >> Peritoneal fluid 10/25 >>  Antimicrobials:  Zosyn 10/26 >>   Interim history/subjective:   Denies pain Conversing with friend/ neighbor Rhonda at bedside Afebrile UO improving On low dose dopamine overnight  Objective   Blood pressure (!) 128/48, pulse 85, temperature 97.8 F (36.6 C), temperature source Oral, resp. rate 20, height 5\' 8"  (1.727 m), weight  63.5 kg, SpO2 99 %.    Vent Mode: PSV;CPAP FiO2 (%):  [35 %] 35 % PEEP:  [5 cmH20] 5 cmH20 Pressure Support:  [10 cmH20] 10 cmH20   Intake/Output Summary (Last 24 hours) at 06/29/2020 0851 Last data filed at 06/29/2020 0447 Gross per 24 hour  Intake 2495.35 ml  Output 1575 ml  Net 920.35 ml   Filed Weights   06/27/20 1945  Weight: 63.5 kg    Examination: Gen:      elderly man, no distress , on [place] HEENT:  EOMI, sclera anicteric, mild pallor  Neck:     No JVD; no thyromegaly, RIJ CVL Lungs:  Decreased BS BL CV:         Regular rate and rhythm; no murmurs Abd:      hypoactive bowel sounds; soft,  decreased tender; no palpable masses, no distension Ext:    No edema; adequate peripheral perfusion Skin:      Warm and dry; no rash Neuro: alert ,interactive, non focal     Chest x-ray personally reviewed which shows right IJ and ET tube in position, no new infiltrates or effusions.  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 Taper dopamine to off,  keep  HR >60 & SBP >90  Best practice:  Diet: NPO DVT prophylaxis: Subcu heparin GI prophylaxis: Protonix Glucose control: SSI Mobility: Bedrest , mobilise Code Status: Full Family Communication: Per primary Disposition: ICU, can change to SDU  Labs   CBC: Recent Labs  Lab 06/27/20 2009 06/28/20 0522 06/29/20 0437  WBC 8.7 4.3 12.0*  NEUTROABS 7.7 2.5  --   HGB 12.3* 11.6* 9.9*  HCT 38.2* 38.2* 30.9*  MCV 97.2 103.5* 97.2  PLT 392 316 416    Basic Metabolic  Panel: Recent Labs  Lab 06/28/20 0522 06/28/20 1016 06/28/20 1652 06/29/20 0101 06/29/20 0437  NA 134* 132* 135 137 137  K 5.2* 5.3* 5.6* 4.6 4.4  CL 104 106 101 103 103  CO2 21* 21* 21* 24 26  GLUCOSE 107* 106* 96 77 84  BUN 56* 57* 58* 55* 54*  CREATININE 2.52* 2.72* 2.99* 2.59* 2.40*  CALCIUM 7.7* 7.2* 7.7* 7.8* 7.7*  MG 2.5*  --   --   --  2.3  PHOS 5.1*  --   --   --  4.3   GFR: Estimated Creatinine Clearance: 22 mL/min (A) (by C-G formula based on SCr of 2.4 mg/dL (H)). Recent Labs  Lab 06/27/20 2009 06/28/20 0522 06/28/20 1521 06/28/20 1652 06/29/20 0437  PROCALCITON  --   --  58.00  --  49.03  WBC 8.7 4.3  --   --  12.0*  LATICACIDVEN  --   --  3.1* 2.3*  --     PCCM will be available as needed  Kara Mead MD. FCCP. [facility] See Amion for pager  If no response to pager , please call [phone]  After 7:00 pm call Elink  [phone]   06/29/2020

## 2020-06-30 DIAGNOSIS — R198 Other specified symptoms and signs involving the digestive system and abdomen: Secondary | ICD-10-CM

## 2020-06-30 DIAGNOSIS — Z978 Presence of other specified devices: Secondary | ICD-10-CM

## 2020-06-30 LAB — CBC WITH DIFFERENTIAL/PLATELET
Abs Immature Granulocytes: 0.16 10*3/uL — ABNORMAL HIGH (ref 0.00–0.07)
Basophils Absolute: 0 10*3/uL (ref 0.0–0.1)
Basophils Relative: 0 %
Eosinophils Absolute: 0.4 10*3/uL (ref 0.0–0.5)
Eosinophils Relative: 3 %
HCT: 26.8 % — ABNORMAL LOW (ref 39.0–52.0)
Hemoglobin: 8.5 g/dL — ABNORMAL LOW (ref 13.0–17.0)
Immature Granulocytes: 1 %
Lymphocytes Relative: 9 %
Lymphs Abs: 1.1 10*3/uL (ref 0.7–4.0)
MCH: 30.9 pg (ref 26.0–34.0)
MCHC: 31.7 g/dL (ref 30.0–36.0)
MCV: 97.5 fL (ref 80.0–100.0)
Monocytes Absolute: 0.2 10*3/uL (ref 0.1–1.0)
Monocytes Relative: 2 %
Neutro Abs: 10.7 10*3/uL — ABNORMAL HIGH (ref 1.7–7.7)
Neutrophils Relative %: 85 %
Platelets: 253 10*3/uL (ref 150–400)
RBC: 2.75 MIL/uL — ABNORMAL LOW (ref 4.22–5.81)
RDW: 14.9 % (ref 11.5–15.5)
WBC Morphology: INCREASED
WBC: 12.5 10*3/uL — ABNORMAL HIGH (ref 4.0–10.5)
nRBC: 0 % (ref 0.0–0.2)

## 2020-06-30 LAB — COMPREHENSIVE METABOLIC PANEL
ALT: 17 U/L (ref 0–44)
AST: 21 U/L (ref 15–41)
Albumin: 1.9 g/dL — ABNORMAL LOW (ref 3.5–5.0)
Alkaline Phosphatase: 37 U/L — ABNORMAL LOW (ref 38–126)
Anion gap: 9 (ref 5–15)
BUN: 43 mg/dL — ABNORMAL HIGH (ref 8–23)
CO2: 26 mmol/L (ref 22–32)
Calcium: 7.8 mg/dL — ABNORMAL LOW (ref 8.9–10.3)
Chloride: 105 mmol/L (ref 98–111)
Creatinine, Ser: 1.82 mg/dL — ABNORMAL HIGH (ref 0.61–1.24)
GFR, Estimated: 37 mL/min — ABNORMAL LOW (ref >60)
Glucose, Bld: 107 mg/dL — ABNORMAL HIGH (ref 70–99)
Potassium: 3.9 mmol/L (ref 3.5–5.1)
Sodium: 140 mmol/L (ref 135–145)
Total Bilirubin: 0.5 mg/dL (ref 0.3–1.2)
Total Protein: 4.9 g/dL — ABNORMAL LOW (ref 6.5–8.1)

## 2020-06-30 LAB — GLUCOSE, CAPILLARY
Glucose-Capillary: 100 mg/dL — ABNORMAL HIGH (ref 70–99)
Glucose-Capillary: 106 mg/dL — ABNORMAL HIGH (ref 70–99)
Glucose-Capillary: 107 mg/dL — ABNORMAL HIGH (ref 70–99)
Glucose-Capillary: 121 mg/dL — ABNORMAL HIGH (ref 70–99)
Glucose-Capillary: 123 mg/dL — ABNORMAL HIGH (ref 70–99)
Glucose-Capillary: 96 mg/dL (ref 70–99)
Glucose-Capillary: 99 mg/dL (ref 70–99)

## 2020-06-30 LAB — URINE CULTURE: Culture: NO GROWTH

## 2020-06-30 LAB — MAGNESIUM: Magnesium: 2.3 mg/dL (ref 1.7–2.4)

## 2020-06-30 LAB — PROCALCITONIN: Procalcitonin: 31.72 ng/mL

## 2020-06-30 LAB — PHOSPHORUS: Phosphorus: 3.1 mg/dL (ref 2.5–4.6)

## 2020-06-30 LAB — TRIGLYCERIDES: Triglycerides: 129 mg/dL (ref <150)

## 2020-06-30 LAB — PREALBUMIN: Prealbumin: 6.9 mg/dL — ABNORMAL LOW (ref 18–38)

## 2020-06-30 MED ORDER — DEXTROSE-NACL 5-0.9 % IV SOLN: INTRAVENOUS | Status: AC

## 2020-06-30 MED ORDER — TRAVASOL 10 % IV SOLN
INTRAVENOUS | Status: AC
  Filled 2020-06-30: qty 480

## 2020-06-30 NOTE — Progress Notes (Signed)
Initial Nutrition Assessment  DOCUMENTATION CODES:   Not applicable  INTERVENTION:  TPN per pharmacy  Will monitor for diet advancement  Recommend monitoring magnesium, potassium, and phosphorus daily as pt is at risk for refeeding given NPO x 3 days with significant electrolyte abnormalities on admission.  NUTRITION DIAGNOSIS:   Inadequate oral intake related to acute illness (gastric ulcer perforation s/p ex-lap with omental patch) as evidenced by NPO status.    GOAL:   Patient will meet greater than or equal to 90% of their needs    MONITOR:   Labs, I & O's, Diet advancement, Weight trends  REASON FOR ASSESSMENT:   Consult New TPN/TNA  ASSESSMENT:  RD working remotely.  79 year old male with no documented medical problems and has not been seen by a physician in 5 years presented with 2 week history of intermittent abdominal pain, occasionally worsened with oral intake. Patient reports unrelenting epigastric abdominal pain and nausea after eating some chocolate and found to have acute gastric perforation.  10/25-intubated 10/26-RIJ CVL, extubated  Pt is POD#2 s/p ex-lap with omental patch for perforated gastric ulcer. Postop course complicated by hypotension, AKI and complete heart block with junctional escape rhythm and mild hyperkalemia and left intubated post procedure.   Pt awake, alert this morning at RD visit. He endorses significant pain and is thirsty, says he notified his nurse. Patient recalls usually having a great appetite and eats well at baseline. Unable to obtain detailed nutrition history, he does report cooking most meals at home and likes runny eggs for breakfast. He denied recent changes in weight, recalls usually weighing around 160 lbs. Patient requesting oral care, RD left room, RN informed of pt request.   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 -blood pressures improving -hyperkalemia s/p aggressive repletion, now resolved  NUTRITION - FOCUSED PHYSICAL EXAM: Mild upper arm and thoracic, moderate orbital fat depletion; Mild dorsal hand, moderate temple, clavicle muscle depletion     Diet Order:   Diet Order            Diet NPO time specified  Diet effective now                 EDUCATION NEEDS:   Education needs have been addressed  Skin:  Skin Assessment: Reviewed RN Assessment  Last BM:  pta  Height:   Ht Readings from Last 1 Encounters:  06/28/20 5\' 8"  (1.727 m)    Weight:   Wt Readings from Last 1 Encounters:  06/29/20 70.1 kg    BMI:  Body mass index is 23.5 kg/m.  Estimated Nutritional Needs:   Kcal:  4098-1191  Protein:  84-95  Fluid:  >1.7 L/day   Lajuan Lines, RD, LDN Clinical Nutrition After Hours/Weekend Pager # [phone]

## 2020-06-30 NOTE — Progress Notes (Addendum)
PROGRESS NOTE   Frederick [name]  ZOX:[id number] DOB: [birth year] DOA: 06/27/2020 PCP: Patient, No Pcp Per   Chief Complaint  Patient presents with  . Abdominal Pain    Brief Admission History:  79 y.o. male with no documented chronic medical problems presenting with 2-week history of abdominal pain that was intermittent in nature, occasionally worse with food.  The patient has not seen a physician for nearly 5 years.  He does not take any prescription medications.  Apparently, the patient ate some chocolate on 06/27/2020 after which he had unrelenting epigastric abdominal pain.  There was some nausea without any emesis.  The patient denies any NSAIDs.  Because of his abdominal pain, the patient presented for further evaluation.  He had denied any chest pain, shortness breath, diarrhea, dysuria.  In the emergency department, the patient was afebrile with soft blood pressures.  CT of the abdomen and pelvis showed moderate free air in the upper abdomen with a small amount of free fluid consistent with perforated viscus.  It was suspected that this was likely in the prepyloric region of the stomach.  General surgery was consulted.  The patient was taken to the operating room and underwent an exploratory laparotomy with vascular omental patch placement.  Unfortunately, there was difficulty extubating the patient postoperatively.  On the morning of 06/28/2020, the patient began developing hypotension.  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    Assessment & Plan:   Principal Problem:   Gastric perforation, acute Active Problems:   Gastric perforation (HCC)   Acute respiratory failure with hypoxia (HCC)   Hyperkalemia   AKI (acute kidney injury) ([place])   Complete heart block (HCC)   Hypotension  1. POD#2 s/p Exp laparotomy with omental patch for perforated gastric ulcer - Pt is recovering well postop, blood pressures improved, he is now extubated and off dobutamine.   Consulted to pharm D for TPN infusion to start 10/28.  Surgery planning UGI study 10/29 to check for leaks.   2. Hypotension - resolved now.  He was briefly on dobutamine infusion which is now discontinued. BPs are much improved.   3. Acute respiratory failure with hypoxia - Pt has now been extubated and on nasal cannula.  PCCM consulted.  He is improving.  4. Hyperkalemia - treated aggressively and resolved now, following.  5. AV block - Appreciate cardiology consultation. For now patient is having no bradycardia. Follow.  6. Stage 4 CKD - creatinine slightly improved with IV fluids and supportive measures.  Following.     DVT prophylaxis: sQ heparin  Code Status:  Full  Family Communication: friend updated by telephone Disposition: TBD   Status is: Inpatient  Remains inpatient appropriate because:Hemodynamically unstable, Persistent severe electrolyte disturbances, IV treatments appropriate due to intensity of illness or inability to take PO and Inpatient level of care appropriate due to severity of illness   Dispo: The patient is from: Home              Anticipated d/c is to: TBD              Anticipated d/c date is: 3 days              Patient currently is not medically stable to d/c.  Consultants:   Cardiology  pccm  Surgery   Procedures:   Central line placement 10/26  Exp lap with omental patch 10/26  Antimicrobials:  Zosyn 10/26>>  Subjective: Pt says he has  a really dry mouth today.    Objective: Vitals:   06/30/20 0445 06/30/20 0515 06/30/20 0714 06/30/20 0804  BP: (!) 164/70 138/70    Pulse: 78 79 85   Resp: _0 Temp:   98.4 F (36.9 C)   TempSrc:   Oral   SpO2: 100% 99% 100% 99%  Weight:      Height:        Intake/Output Summary (Last 24 hours) at 06/30/2020 0955 Last data filed at 06/30/2020 0929 Gross per 24 hour  Intake 997.29 ml  Output 765 ml  Net 232.29 ml   Filed Weights   06/27/20 1945 06/29/20 1700  Weight: 63.5 kg 70.1 kg    Examination:  General exam: elderly chronically ill appearing male, awake, alert, NAD, Appears calm and comfortable  Respiratory system: Clear to auscultation. Respiratory effort normal. Cardiovascular system: normal S1 & S2 heard. No JVD, murmurs, rubs, gallops or clicks. No pedal edema. Gastrointestinal system: Abdomen is nondistended, soft and light incisional tenderness, wound clean and dry and intact. No organomegaly or masses felt. hypoactive BS.  Central nervous system: Alert and oriented. No focal neurological deficits. Extremities: Symmetric 5 x 5 power. Skin: No rashes, lesions or ulcers Psychiatry: Judgement and insight appear normal. Mood & affect appropriate.   Data Reviewed: I have personally reviewed following labs and imaging studies  CBC: Recent Labs  Lab 06/27/20 2009 06/28/20 0522 06/29/20 0437 06/30/20 0447  WBC 8.7 4.3 12.0* 12.5*  NEUTROABS 7.7 2.5  --  10.7*  HGB 12.3* 11.6* 9.9* 8.5*  HCT 38.2* 38.2* 30.9* 26.8*  MCV 97.2 103.5* 97.2 97.5  PLT 392 316 294 574    Basic Metabolic Panel: Recent Labs  Lab 06/28/20 0522 06/28/20 0522 06/28/20 1016 06/28/20 1652 06/29/20 0101 06/29/20 0437 06/30/20 0447  NA 134*   < > 132* 135 137 137 140  K 5.2*   < > 5.3* 5.6* 4.6 4.4 3.9  CL 104   < > 106 101 103 103 105  CO2 21*   < > 21* 21* _1 GLUCOSE 107*   < > 106* 96 77 84 107*  BUN 56*   < > 57* 58* 55* 54* 43*  CREATININE 2.52*   < > 2.72* 2.99* 2.59* 2.40* 1.82*  CALCIUM 7.7*   < > 7.2* 7.7* 7.8* 7.7* 7.8*  MG 2.5*  --   --   --   --  2.3 2.3  PHOS 5.1*  --   --   --   --  4.3 3.1   < > = values in this interval not displayed.    GFR: Estimated Creatinine Clearance: 31.3 mL/min (A) (by C-G formula based on SCr of 1.82 mg/dL (H)).  Liver Function Tests: Recent Labs  Lab 06/27/20 2009 06/29/20 0437 06/30/20 0447  AST _2 ALT _3 ALKPHOS 47 30* 37*  BILITOT 1.1 0.7 0.5  PROT 7.2 5.2* 4.9*  ALBUMIN 3.7 2.2* 1.9*     CBG: Recent Labs  Lab 06/29/20 1652 06/29/20 2029 06/30/20 0035 06/30/20 0445 06/30/20 0713  GLUCAP 71 83 96 99 100*    Recent Results (from the past 240 hour(s))  Respiratory Panel by RT PCR (Flu A&B, Covid) - Nasopharyngeal Swab     Status: None   Collection Time: 06/27/20  9:48 PM   Specimen: Nasopharyngeal Swab  Result Value Ref Range Status   SARS Coronavirus 2 by RT PCR NEGATIVE NEGATIVE Final  Comment: (NOTE) SARS-CoV-2 target nucleic acids are NOT DETECTED.  The SARS-CoV-2 RNA is generally detectable in upper respiratoy specimens during the acute phase of infection. The lowest concentration of SARS-CoV-2 viral copies this assay can detect is 131 copies/mL. A negative result does not preclude SARS-Cov-2 infection and should not be used as the sole basis for treatment or other patient management decisions. A negative result may occur with  improper specimen collection/handling, submission of specimen other than nasopharyngeal swab, presence of viral mutation(s) within the areas targeted by this assay, and inadequate number of viral copies (<131 copies/mL). A negative result must be combined with clinical observations, patient history, and epidemiological information. The expected result is Negative.  Fact Sheet for Patients:  PinkCheek.be  Fact Sheet for Healthcare Providers:  GravelBags.it  This test is no t yet approved or cleared by the [place] FDA and  has been authorized for detection and/or diagnosis of SARS-CoV-2 by FDA under an Emergency Use Authorization (EUA). This EUA will remain  in effect (meaning this test can be used) for the duration of the COVID-19 declaration under Section 564(b)(1) of the Act, 21 U.S.C. section 360bbb-3(b)(1), unless the authorization is terminated or revoked sooner.     Influenza A by PCR NEGATIVE NEGATIVE Final   Influenza B by PCR NEGATIVE NEGATIVE Final     Comment: (NOTE) The Xpert Xpress SARS-CoV-2/FLU/RSV assay is intended as an aid in  the diagnosis of influenza from Nasopharyngeal swab specimens and  should not be used as a sole basis for treatment. Nasal washings and  aspirates are unacceptable for Xpert Xpress SARS-CoV-2/FLU/RSV  testing.  Fact Sheet for Patients: PinkCheek.be  Fact Sheet for Healthcare Providers: GravelBags.it  This test is not yet approved or cleared by the [place] FDA and  has been authorized for detection and/or diagnosis of SARS-CoV-2 by  FDA under an Emergency Use Authorization (EUA). This EUA will remain  in effect (meaning this test can be used) for the duration of the  Covid-19 declaration under Section 564(b)(1) of the Act, 21  U.S.C. section 360bbb-3(b)(1), unless the authorization is  terminated or revoked. Performed at [facility], [address], [place], [place] [address]   Aerobic/Anaerobic Culture (surgical/deep wound)     Status: None (Preliminary result)   Collection Time: 06/28/20  2:45 AM   Specimen: PATH Other; Tissue  Result Value Ref Range Status   Specimen Description   Final    PERITONEAL Performed at [facility], [address], [place], [place] [address]    Special Requests   Final    GASTRIC ULCER PREFORATION Performed at [facility], [address], [place], [place] [address]    Gram Stain   Final    RARE WBC PRESENT, PREDOMINANTLY PMN NO ORGANISMS SEEN    Culture   Final    CULTURE REINCUBATED FOR BETTER GROWTH Performed at [place] [address] [place], [place] [address]    Report Status PENDING  Incomplete  Culture, blood (Routine X 2) w Reflex to ID Panel     Status: None (Preliminary result)   Collection Time: 06/28/20  3:21 PM   Specimen: BLOOD  Result Value Ref Range Status   Specimen Description BLOOD LEFT ANTECUBITAL  Final   Special Requests   Final    BOTTLES DRAWN AEROBIC AND  ANAEROBIC Blood Culture adequate volume   Culture   Final    NO GROWTH 2 DAYS Performed at [facility], [address], [place], [place]  [address]    Report Status PENDING  Incomplete  Culture, blood (Routine X 2) w Reflex to ID Panel     Status: None (Preliminary result)   Collection Time: 06/28/20  3:21 PM   Specimen: BLOOD LEFT HAND  Result Value Ref Range Status   Specimen Description BLOOD LEFT HAND  Final   Special Requests   Final    BOTTLES DRAWN AEROBIC AND ANAEROBIC Blood Culture adequate volume   Culture   Final    NO GROWTH 2 DAYS Performed at [facility], [address], [place], [place] [address]    Report Status PENDING  Incomplete  Culture, Urine     Status: None   Collection Time: 06/28/20  3:27 PM   Specimen: Urine, Clean Catch  Result Value Ref Range Status   Specimen Description   Final    URINE, CLEAN CATCH Performed at [facility], [address], [place], [place] [address]    Special Requests   Final    NONE Performed at [facility], [address] [place], [place] [address]    Culture   Final    NO GROWTH Performed at [facility], [place] [address], [place], [place] [address]    Report Status 06/30/2020 FINAL  Final     Radiology Studies: US RENAL  Result Date: 06/28/2020 CLINICAL DATA:  79 year old male with acute renal insufficiency. EXAM: RENAL / URINARY TRACT ULTRASOUND COMPLETE COMPARISON:  None. FINDINGS: Right Kidney: Renal measurements: 10.9 x 4.3 x 3.7 cm = volume: 85 mL. Mild parenchyma atrophy and increased echogenicity. No hydronephrosis or shadowing stone. There is a 3 cm upper pole cyst. Left Kidney: Renal measurements: 9.1 x 4.8 x 3.7 cm = volume: 85 mL. There is mild parenchyma atrophy and increased echogenicity. No hydronephrosis or shadowing stone. There is a 3 cm upper pole cyst and a 6 cm parapelvic cyst. Several way shin of the left kidney and cysts are limited due to suboptimal visualization, body habitus, and overlying bowel  gas. Bladder: Not seen. Other: None. IMPRESSION: 1. Mildly atrophic and echogenic kidneys in keeping with chronic kidney disease. No hydronephrosis or shadowing stone. 2. Bilateral renal cysts, suboptimally evaluated due to body habitus and bowel gas. Electronically Signed   By: Anner Crete M.D.   On: 06/28/2020 16:13   DG Chest Port 1 View  Result Date: 06/28/2020 CLINICAL DATA:  Central catheter placement.  Hypoxia. EXAM: PORTABLE CHEST 1 VIEW COMPARISON:  June 28, 2020 study obtained earlier in the day FINDINGS: Central catheter tip is in the superior vena cava. Endotracheal tube tip is 6.0 cm above the carina. Nasogastric tube tip and side port are below the diaphragm. No pneumothorax. There is a small right pleural effusion. There is slight bibasilar atelectasis. Lungs elsewhere are clear. Heart size and pulmonary vascularity are normal. No adenopathy. There is degenerative change in each shoulder. IMPRESSION: Tube and catheter positions as described without pneumothorax. Small right pleural effusion with slight bibasilar atelectasis. No edema or airspace opacity. Stable cardiac silhouette. Electronically Signed   By: Lowella Grip III M.D.   On: 06/28/2020 15:18   ECHOCARDIOGRAM COMPLETE  Result Date: 06/29/2020    ECHOCARDIOGRAM REPORT   Patient Name:   ASAIAH SCARBER Date of Exam: 06/29/2020 Medical Rec #:  [id number]    Height:       68.0 in Accession #:    [id number]   Weight:       140.0 lb Date of Birth:  [birth year]  BSA:          1.756 m Patient Age:    65 years     BP:           128/48 mmHg Patient Gender: M            HR:           80 bpm. Exam Location:  [facility] Procedure: 2D Echo Indications:    Dyspnea 786.09 / R06.00  History:        Patient has no prior history of Echocardiogram examinations.                 Risk Factors:Current Smoker. Acute Respiratory Failue, Gastric                 Perforation, CHB, Acute Kidney Injury.  Sonographer:    Leavy Cella RDCS (AE)  Referring Phys: [id number] [facility]  1. Left ventricular ejection fraction, by estimation, is 55 to 60%. The left ventricle has normal function. The left ventricle has no regional wall motion abnormalities. There is mild left ventricular hypertrophy. Left ventricular diastolic parameters were normal.  2. Right ventricular systolic function is normal. The right ventricular size is normal.  3. The mitral valve is normal in structure. No evidence of mitral valve regurgitation. No evidence of mitral stenosis.  4. The aortic valve is tricuspid. There is mild calcification of the aortic valve. There is mild thickening of the aortic valve. Aortic valve regurgitation is not visualized. No aortic stenosis is present.  5. At least moderate pulmonary HTN, limited evaluation due to poorly visualized IVC.  6. The inferior vena cava is normal in size with greater than 50% respiratory variability, suggesting right atrial pressure of 3 mmHg. FINDINGS  Left Ventricle: Left ventricular ejection fraction, by estimation, is 55 to 60%. The left ventricle has normal function. The left ventricle has no regional wall motion abnormalities. The left ventricular internal cavity size was normal in size. There is  mild left ventricular hypertrophy. Left ventricular diastolic parameters were normal. Right Ventricle: The right ventricular size is normal. No increase in right ventricular wall thickness. Right ventricular systolic function is normal. Left Atrium: Left atrial size was normal in size. Right Atrium: Right atrial size was normal in size. Pericardium: There is no evidence of pericardial effusion. Mitral Valve: The mitral valve is normal in structure. There is mild thickening of the mitral valve leaflet(s). There is mild calcification of the mitral valve leaflet(s). Mild mitral annular calcification. No evidence of mitral valve regurgitation. No evidence of mitral valve stenosis. Tricuspid Valve: The tricuspid valve is  normal in structure. Tricuspid valve regurgitation is mild . No evidence of tricuspid stenosis. Aortic Valve: The aortic valve is tricuspid. There is mild calcification of the aortic valve. There is mild thickening of the aortic valve. There is mild aortic valve annular calcification. Aortic valve regurgitation is not visualized. No aortic stenosis  is present. Aortic valve mean gradient measures 4.7 mmHg. Aortic valve peak gradient measures 8.7 mmHg. Aortic valve area, by VTI measures 2.23 cm. Pulmonic Valve: The pulmonic valve was not well visualized. Pulmonic valve regurgitation is not visualized. No evidence of pulmonic stenosis. Aorta: The aortic root is normal in size and structure. Pulmonary Artery: At least moderate pulmonary HTN, limited evaluation due to poorly visualized IVC. Venous: The inferior vena cava is normal in size with greater than 50% respiratory variability, suggesting right atrial pressure of 3 mmHg. IAS/Shunts: The interatrial septum was not  well visualized.  LEFT VENTRICLE PLAX 2D LVIDd:         4.10 cm  Diastology LVIDs:         3.37 cm  LV e' medial:    13.40 cm/s LV PW:         1.10 cm  LV E/e' medial:  7.6 LV IVS:        1.09 cm  LV e' lateral:   11.70 cm/s LVOT diam:     1.90 cm  LV E/e' lateral: 8.7 LV SV:         57 LV SV Index:   33 LVOT Area:     2.84 cm  RIGHT VENTRICLE RV S prime:     10.00 cm/s TAPSE (M-mode): 2.3 cm LEFT ATRIUM             Index       RIGHT ATRIUM           Index LA diam:        3.10 cm 1.77 cm/m  RA Area:     14.30 cm LA Vol (A2C):   41.2 ml 23.46 ml/m RA Volume:   32.60 ml  18.56 ml/m LA Vol (A4C):   45.8 ml 26.08 ml/m LA Biplane Vol: 46.9 ml 26.70 ml/m  AORTIC VALVE AV Area (Vmax):    2.35 cm AV Area (Vmean):   2.29 cm AV Area (VTI):     2.23 cm AV Vmax:           147.68 cm/s AV Vmean:          100.148 cm/s AV VTI:            0.257 m AV Peak Grad:      8.7 mmHg AV Mean Grad:      4.7 mmHg LVOT Vmax:         122.52 cm/s LVOT Vmean:        80.995  cm/s LVOT VTI:          0.202 m LVOT/AV VTI ratio: 0.79  AORTA Ao Root diam: 3.30 cm MITRAL VALVE                TRICUSPID VALVE MV Area (PHT): 3.65 cm     TR Peak grad:   44.4 mmHg MV Decel Time: 208 msec     TR Vmax:        333.00 cm/s MV E velocity: 102.00 cm/s MV A velocity: 44.30 cm/s   SHUNTS MV E/A ratio:  2.30         Systemic VTI:  0.20 m                             Systemic Diam: 1.90 cm Carlyle Dolly MD Electronically signed by Carlyle Dolly MD Signature Date/Time: 06/29/2020/11:25:24 AM    Final    Scheduled Meds: . chlorhexidine gluconate (MEDLINE KIT)  15 mL Mouth Rinse BID  . Chlorhexidine Gluconate Cloth  6 each Topical Daily  . heparin injection (subcutaneous)  5,000 Units Subcutaneous Q8H  . ipratropium-albuterol  3 mL Nebulization TID  . mouth rinse  15 mL Mouth Rinse BID  . pantoprazole (PROTONIX) IV  40 mg Intravenous Q12H   Continuous Infusions: . dextrose 5 % and 0.9% NaCl 75 mL/hr at 06/30/20 0614  . DOPamine Stopped (06/30/20 0436)  . piperacillin-tazobactam (ZOSYN)  IV 3.375 g (06/30/20 0937)     LOS: 2 days   Critical Care  Procedure Note Authorized and Performed by: Murvin Natal MD  Total Critical Care time:  32 minutes  Due to a high probability of clinically significant, life threatening deterioration, the patient required my highest level of preparedness to intervene emergently and I personally spent this critical care time directly and personally managing the patient.  This critical care time included obtaining a history; examining the patient, pulse oximetry; ordering and review of studies; arranging urgent treatment with development of a management plan; evaluation of patient's response of treatment; frequent reassessment; and discussions with other providers.  This critical care time was performed to assess and manage the high probability of imminent and life threatening deterioration that could result in multi-organ failure.  It was exclusive of separately  billable procedures and treating other patients and teaching time.   Irwin Brakeman, MD How to contact the [facility] Attending or Consulting provider [place] or covering provider during after hours [place], for this patient?  1. Check the care team in [facility] and look for a) attending/consulting [facility] provider listed and b) the [facility] team listed 2. Log into [email or web address] and use [place]'s universal password to access. If you do not have the password, please contact the hospital operator. 3. Locate the [facility] provider you are looking for under [facility] and page to a number that you can be directly reached. 4. If you still have difficulty reaching the provider, please page the [facility] (Director on Call) for the Hospitalists listed on amion for assistance.  06/30/2020, 9:55 AM

## 2020-06-30 NOTE — Progress Notes (Signed)
PHARMACY - TOTAL PARENTERAL NUTRITION CONSULT NOTE   Indication: gastric perforation  Patient Measurements: Height: 5\' 8"  (172.7 cm) Weight: 70.1 kg (154 lb 8.7 oz) IBW/kg (Calculated) : 68.4 TPN AdjBW (KG): 63.5 Body mass index is 23.5 kg/m. Usual Weight:    Assessment: Per RD note: Inadequate oral intake related to acute illness (gastric ulcer perforation s/p ex-lap with omental patch) as evidenced by NPO status.  Glucose / Insulin: RBG 107-->   10 units of aspart in past 24h Electrolytes:  Na 140   K 3.9   Cl 105    Ca corr: 9.1     Mg: 2.3 Renal:  Scr 1.82mg /dL LFT:  21/17  TG: 129 Prealbumin:  6.9  Albumin: 1.9 Intake / Output:   Intake/Output Summary (Last 24 hours) at 06/30/2020 1150 Last data filed at 06/30/2020 2257 Gross per 24 hour  Intake 997.29 ml  Output 765 ml  Net 232.29 ml    MIVF:  D5NS @75ml /hr GI Imaging: UGI planned for 10-29 to see if leak present Surgeries / Procedures:   POD #2 s/p exp laparotomy w/ omental patch for perforated gastric ulcer  Central access:  CVC Triple Lumen placed 06/28/20 @rt  internal jugular TPN start date:  06-30-20 at 1800  Nutritional Goals (per RD recommendation on 06-30-20): kCal: 5051-8335  Protein: 84-95g  Fluid: >1700 mL Goal TPN rate is 70 mL/hr (provides 85 g of protein and ~1700 kcals per day)  Current Nutrition:  NG dislodged  Now NPO  Plan:  Start TPN at 40 mL/hr at 1800 Electrolytes in TPN: 57mEq/L of Na, 37mEq/L of K, 62mEq/L of Ca, 3mEq/L of Mg, and 30mmol/L of Phos.  Cl:Ac ratio 1:1 Add standard MVI and trace elements to TPN Initiate Sensitive q4h SSI and adjust as needed  Reduce MIVF to 40 mL/hr at 1800 Monitor TPN labs on Mon/Thurs   Despina Pole 06/30/2020,11:30 AM

## 2020-06-30 NOTE — Progress Notes (Signed)
Progress Note  Patient Name: Frederick [name] Date of Encounter: 06/30/2020  Primary Cardiologist: Carlyle Dolly, MD  Subjective   Reports abdominal discomfort.  No chest pain or breathlessness.  Inpatient Medications    Scheduled Meds: . chlorhexidine gluconate (MEDLINE KIT)  15 mL Mouth Rinse BID  . Chlorhexidine Gluconate Cloth  6 each Topical Daily  . heparin injection (subcutaneous)  5,000 Units Subcutaneous Q8H  . ipratropium-albuterol  3 mL Nebulization TID  . mouth rinse  15 mL Mouth Rinse BID  . pantoprazole (PROTONIX) IV  40 mg Intravenous Q12H   Continuous Infusions: . dextrose 5 % and 0.9% NaCl 75 mL/hr at 06/30/20 0614  . DOPamine Stopped (06/30/20 0436)  . piperacillin-tazobactam (ZOSYN)  IV 3.375 g (06/30/20 0300)   PRN Meds: diphenhydrAMINE **OR** diphenhydrAMINE, morphine injection, ondansetron **OR** ondansetron (ZOFRAN) IV   Vital Signs    Vitals:   06/30/20 0445 06/30/20 0515 06/30/20 0714 06/30/20 0804  BP: (!) 164/70 138/70    Pulse: 78 79 85   Resp: _0 Temp:   98.4 F (36.9 C)   TempSrc:   Oral   SpO2: 100% 99% 100% 99%  Weight:      Height:        Intake/Output Summary (Last 24 hours) at 06/30/2020 0853 Last data filed at 06/30/2020 0614 Gross per 24 hour  Intake 997.29 ml  Output 765 ml  Net 232.29 ml   Filed Weights   06/27/20 1945 06/29/20 1700  Weight: 63.5 kg 70.1 kg    Telemetry    Sinus rhythm with prolonged PR interval.  Personally reviewed.  ECG    No ECG reviewed.  Physical Exam   GEN:  Elderly male.  No acute distress.   Neck: No JVD. Cardiac: RRR, no gallop.  Respiratory: Nonlabored. Clear to auscultation bilaterally. GI: Soft, bowel sounds present. MS: No edema.  Labs    Chemistry Recent Labs  Lab 06/27/20 2009 06/28/20 0522 06/29/20 0101 06/29/20 0437 06/30/20 0447  NA 135   < > 137 137 140  K 4.4   < > 4.6 4.4 3.9  CL 97*   < > 103 103 105  CO2 25   < > _1 GLUCOSE 150*   < >  77 84 107*  BUN 59*   < > 55* 54* 43*  CREATININE 2.42*   < > 2.59* 2.40* 1.82*  CALCIUM 8.9   < > 7.8* 7.7* 7.8*  PROT 7.2  --   --  5.2* 4.9*  ALBUMIN 3.7  --   --  2.2* 1.9*  AST 21  --   --  27 21  ALT 14  --   --  21 17  ALKPHOS 47  --   --  30* 37*  BILITOT 1.1  --   --  0.7 0.5  GFRNONAA 26*   < > 24* 27* 37*  ANIONGAP 13   < > _2 < > = values in this interval not displayed.     Hematology Recent Labs  Lab 06/28/20 0522 06/29/20 0437 06/30/20 0447  WBC 4.3 12.0* 12.5*  RBC 3.69* 3.18* 2.75*  HGB 11.6* 9.9* 8.5*  HCT 38.2* 30.9* 26.8*  MCV 103.5* 97.2 97.5  MCH 31.4 31.1 30.9  MCHC 30.4 32.0 31.7  RDW 14.8 14.6 14.9  PLT 316 294 253    Cardiac Enzymes Recent Labs  Lab 06/28/20 1322 06/28/20 1521  TROPONINIHS 18* 21*  Radiology    US RENAL  Result Date: 06/28/2020 CLINICAL DATA:  79 year old male with acute renal insufficiency. EXAM: RENAL / URINARY TRACT ULTRASOUND COMPLETE COMPARISON:  None. FINDINGS: Right Kidney: Renal measurements: 10.9 x 4.3 x 3.7 cm = volume: 85 mL. Mild parenchyma atrophy and increased echogenicity. No hydronephrosis or shadowing stone. There is a 3 cm upper pole cyst. Left Kidney: Renal measurements: 9.1 x 4.8 x 3.7 cm = volume: 85 mL. There is mild parenchyma atrophy and increased echogenicity. No hydronephrosis or shadowing stone. There is a 3 cm upper pole cyst and a 6 cm parapelvic cyst. Several way shin of the left kidney and cysts are limited due to suboptimal visualization, body habitus, and overlying bowel gas. Bladder: Not seen. Other: None. IMPRESSION: 1. Mildly atrophic and echogenic kidneys in keeping with chronic kidney disease. No hydronephrosis or shadowing stone. 2. Bilateral renal cysts, suboptimally evaluated due to body habitus and bowel gas. Electronically Signed   By: Anner Crete M.D.   On: 06/28/2020 16:13   DG Chest Port 1 View  Result Date: 06/28/2020 CLINICAL DATA:  Central catheter placement.   Hypoxia. EXAM: PORTABLE CHEST 1 VIEW COMPARISON:  June 28, 2020 study obtained earlier in the day FINDINGS: Central catheter tip is in the superior vena cava. Endotracheal tube tip is 6.0 cm above the carina. Nasogastric tube tip and side port are below the diaphragm. No pneumothorax. There is a small right pleural effusion. There is slight bibasilar atelectasis. Lungs elsewhere are clear. Heart size and pulmonary vascularity are normal. No adenopathy. There is degenerative change in each shoulder. IMPRESSION: Tube and catheter positions as described without pneumothorax. Small right pleural effusion with slight bibasilar atelectasis. No edema or airspace opacity. Stable cardiac silhouette. Electronically Signed   By: Lowella Grip III M.D.   On: 06/28/2020 15:18   DG CHEST PORT 1 VIEW  Result Date: 06/28/2020 CLINICAL DATA:  Shortness of breath EXAM: PORTABLE CHEST 1 VIEW COMPARISON:  Earlier same day FINDINGS: Endotracheal and partially imaged enteric tubes again identified. Stable interstitial prominence likely reflecting changes of COPD. No significant pleural effusion. No pneumothorax. Stable cardiomediastinal contours. IMPRESSION: Stable appearance. Interstitial prominence is nonspecific and could reflect chronic changes of COPD. Electronically Signed   By: Macy Mis M.D.   On: 06/28/2020 09:44   ECHOCARDIOGRAM COMPLETE  Result Date: 06/29/2020    ECHOCARDIOGRAM REPORT   Patient Name:   Frederick [name] Date of Exam: 06/29/2020 Medical Rec #:  [id number]    Height:       68.0 in Accession #:    [id number]   Weight:       140.0 lb Date of Birth:  1940-10-03   BSA:          1.756 m Patient Age:    79 years     BP:           128/48 mmHg Patient Gender: M            HR:           80 bpm. Exam Location:  [facility] Procedure: 2D Echo Indications:    Dyspnea 786.09 / R06.00  History:        Patient has no prior history of Echocardiogram examinations.                 Risk Factors:Current Smoker.  Acute Respiratory Failue, Gastric                 Perforation, CHB, Acute  Kidney Injury.  Sonographer:    Leavy Cella RDCS (AE) Referring Phys: [id number] [place]  1. Left ventricular ejection fraction, by estimation, is 55 to 60%. The left ventricle has normal function. The left ventricle has no regional wall motion abnormalities. There is mild left ventricular hypertrophy. Left ventricular diastolic parameters were normal.  2. Right ventricular systolic function is normal. The right ventricular size is normal.  3. The mitral valve is normal in structure. No evidence of mitral valve regurgitation. No evidence of mitral stenosis.  4. The aortic valve is tricuspid. There is mild calcification of the aortic valve. There is mild thickening of the aortic valve. Aortic valve regurgitation is not visualized. No aortic stenosis is present.  5. At least moderate pulmonary HTN, limited evaluation due to poorly visualized IVC.  6. The inferior vena cava is normal in size with greater than 50% respiratory variability, suggesting right atrial pressure of 3 mmHg. FINDINGS  Left Ventricle: Left ventricular ejection fraction, by estimation, is 55 to 60%. The left ventricle has normal function. The left ventricle has no regional wall motion abnormalities. The left ventricular internal cavity size was normal in size. There is  mild left ventricular hypertrophy. Left ventricular diastolic parameters were normal. Right Ventricle: The right ventricular size is normal. No increase in right ventricular wall thickness. Right ventricular systolic function is normal. Left Atrium: Left atrial size was normal in size. Right Atrium: Right atrial size was normal in size. Pericardium: There is no evidence of pericardial effusion. Mitral Valve: The mitral valve is normal in structure. There is mild thickening of the mitral valve leaflet(s). There is mild calcification of the mitral valve leaflet(s). Mild mitral annular  calcification. No evidence of mitral valve regurgitation. No evidence of mitral valve stenosis. Tricuspid Valve: The tricuspid valve is normal in structure. Tricuspid valve regurgitation is mild . No evidence of tricuspid stenosis. Aortic Valve: The aortic valve is tricuspid. There is mild calcification of the aortic valve. There is mild thickening of the aortic valve. There is mild aortic valve annular calcification. Aortic valve regurgitation is not visualized. No aortic stenosis  is present. Aortic valve mean gradient measures 4.7 mmHg. Aortic valve peak gradient measures 8.7 mmHg. Aortic valve area, by VTI measures 2.23 cm. Pulmonic Valve: The pulmonic valve was not well visualized. Pulmonic valve regurgitation is not visualized. No evidence of pulmonic stenosis. Aorta: The aortic root is normal in size and structure. Pulmonary Artery: At least moderate pulmonary HTN, limited evaluation due to poorly visualized IVC. Venous: The inferior vena cava is normal in size with greater than 50% respiratory variability, suggesting right atrial pressure of 3 mmHg. IAS/Shunts: The interatrial septum was not well visualized.  LEFT VENTRICLE PLAX 2D LVIDd:         4.10 cm  Diastology LVIDs:         3.37 cm  LV e' medial:    13.40 cm/s LV PW:         1.10 cm  LV E/e' medial:  7.6 LV IVS:        1.09 cm  LV e' lateral:   11.70 cm/s LVOT diam:     1.90 cm  LV E/e' lateral: 8.7 LV SV:         57 LV SV Index:   33 LVOT Area:     2.84 cm  RIGHT VENTRICLE RV S prime:     10.00 cm/s TAPSE (M-mode): 2.3 cm LEFT ATRIUM  Index       RIGHT ATRIUM           Index LA diam:        3.10 cm 1.77 cm/m  RA Area:     14.30 cm LA Vol (A2C):   41.2 ml 23.46 ml/m RA Volume:   32.60 ml  18.56 ml/m LA Vol (A4C):   45.8 ml 26.08 ml/m LA Biplane Vol: 46.9 ml 26.70 ml/m  AORTIC VALVE AV Area (Vmax):    2.35 cm AV Area (Vmean):   2.29 cm AV Area (VTI):     2.23 cm AV Vmax:           147.68 cm/s AV Vmean:          100.148 cm/s AV  VTI:            0.257 m AV Peak Grad:      8.7 mmHg AV Mean Grad:      4.7 mmHg LVOT Vmax:         122.52 cm/s LVOT Vmean:        80.995 cm/s LVOT VTI:          0.202 m LVOT/AV VTI ratio: 0.79  AORTA Ao Root diam: 3.30 cm MITRAL VALVE                TRICUSPID VALVE MV Area (PHT): 3.65 cm     TR Peak grad:   44.4 mmHg MV Decel Time: 208 msec     TR Vmax:        333.00 cm/s MV E velocity: 102.00 cm/s MV A velocity: 44.30 cm/s   SHUNTS MV E/A ratio:  2.30         Systemic VTI:  0.20 m                             Systemic Diam: 1.90 cm Carlyle Dolly MD Electronically signed by Carlyle Dolly MD Signature Date/Time: 06/29/2020/11:25:24 AM    Final     Patient Profile     79 y.o. male presenting with septic shock in the setting of gastric perforation, acute renal failure, hyperkalemia, and transient complete heart block.  Assessment & Plan    1.  Transient complete heart block.  Patient in sinus rhythm with prolonged PR interval but otherwise appropriate AV conduction.  Potassium normal range.  No longer on dopamine with improvement in blood pressure.  Echocardiogram demonstrates LVEF 55 to 60%, normal RV contraction.  2.  Septic shock in the setting of gastric perforation.  He is postop day #2 status post exploratory laparotomy with omental patch repair.  3.  Renal failure.  Creatinine has improved down to 1.82.  Would continue supportive measures and replete electrolytes as needed.  ECG ordered for reassessment.  Continue telemetry monitoring.  Signed, Rozann Lesches, MD  06/30/2020, 8:53 AM

## 2020-06-30 NOTE — Progress Notes (Addendum)
I was present with the medical student for this service. I personally verified the history of present illness, performed the physical exam, and made the plan for this encounter. I have verified the medical student's documentation and made modifications where appropriately. I have personally documented in my own words a brief history, physical, and plan below.     Doing well. Wants to eat. Having pain but improved when I saw him. Foley in place.  UGI tomorrow TPN Hopefully can feed tomorrow If he wants his neighbor to be POA he should get the paperwork signed. Foley can come out, UOP improving Drifting H&H no signs of bleeding.  Discussed with Dr. Wynetta Emery. Curlene Labrum, MD [facility] [place], [place] [address] [phone] (office)    [facility] Progress Note  2 Days Post-Op  Subjective: Pt reports 9/10 incisional pain and continued nausea. No vomiting. No PO intake, but patient desires to moisten mouth. No overnight events.   Objective: Vital signs in last 24 hours: Temp:  [97.8 F (36.6 C)-98.4 F (36.9 C)] 98.4 F (36.9 C) (10/28 0714) Pulse Rate:  [68-169] 85 (10/28 0714) Resp:  [12-24] 18 (10/28 0714) BP: (104-164)/(45-113) 138/70 (10/28 0515) SpO2:  [95 %-100 %] 99 % (10/28 0804) Weight:  [70.1 kg] 70.1 kg (10/27 1700) Last BM Date:  (UTA)  Intake/Output from previous day: 10/27 0701 - 10/28 0700 In: 997.3 [I.V.:902.7; IV Piggyback:94.6] Out: 4709 [Urine:1600; Drains:115] Urine output = 1600 ml/70.1 kg/ 24 hr = 0.95 ml/kg/hr   Intake/Output this shift: No intake/output data recorded.  General appearance: alert, cooperative and mild distress Head: Normocephalic, without obvious abnormality, atraumatic Resp: Normal WOB, CTAB Cardio: regular rate and rhythm, S1, S2 normal, no murmur, click, rub or gallop GI: soft, non-distended, no rebound or guarding, TTP along incision Incision/Wound:c/d/t  w/ no erythema, induration, or edema appreciated  JP drain: serosanguinous appearance  Lab Results:  Recent Labs    06/29/20 0437 06/30/20 0447  WBC 12.0* 12.5*  HGB 9.9* 8.5*  HCT 30.9* 26.8*  PLT 294 253   BMET Recent Labs    06/29/20 0437 06/30/20 0447  NA 137 140  K 4.4 3.9  CL 103 105  CO2 26 26  GLUCOSE 84 107*  BUN 54* 43*  CREATININE 2.40* 1.82*  CALCIUM 7.7* 7.8*   PT/INR No results for input(s): LABPROT, INR in the last 72 hours.  Studies/Results: US RENAL  Result Date: 06/28/2020 CLINICAL DATA:  79 year old male with acute renal insufficiency. EXAM: RENAL / URINARY TRACT ULTRASOUND COMPLETE COMPARISON:  None. FINDINGS: Right Kidney: Renal measurements: 10.9 x 4.3 x 3.7 cm = volume: 85 mL. Mild parenchyma atrophy and increased echogenicity. No hydronephrosis or shadowing stone. There is a 3 cm upper pole cyst. Left Kidney: Renal measurements: 9.1 x 4.8 x 3.7 cm = volume: 85 mL. There is mild parenchyma atrophy and increased echogenicity. No hydronephrosis or shadowing stone. There is a 3 cm upper pole cyst and a 6 cm parapelvic cyst. Several way shin of the left kidney and cysts are limited due to suboptimal visualization, body habitus, and overlying bowel gas. Bladder: Not seen. Other: None. IMPRESSION: 1. Mildly atrophic and echogenic kidneys in keeping with chronic kidney disease. No hydronephrosis or shadowing stone. 2. Bilateral renal cysts, suboptimally evaluated due to body habitus and bowel gas. Electronically Signed   By: Anner Crete M.D.   On: 06/28/2020 16:13   DG Chest Port 1 View  Result Date: 06/28/2020 CLINICAL DATA:  Central catheter placement.  Hypoxia. EXAM: PORTABLE CHEST 1 VIEW COMPARISON:  June 28, 2020 study obtained earlier in the day FINDINGS: Central catheter tip is in the superior vena cava. Endotracheal tube tip is 6.0 cm above the carina. Nasogastric tube tip and side port are below the diaphragm. No pneumothorax. There is a small  right pleural effusion. There is slight bibasilar atelectasis. Lungs elsewhere are clear. Heart size and pulmonary vascularity are normal. No adenopathy. There is degenerative change in each shoulder. IMPRESSION: Tube and catheter positions as described without pneumothorax. Small right pleural effusion with slight bibasilar atelectasis. No edema or airspace opacity. Stable cardiac silhouette. Electronically Signed   By: Lowella Grip III M.D.   On: 06/28/2020 15:18   DG CHEST PORT 1 VIEW  Result Date: 06/28/2020 CLINICAL DATA:  Shortness of breath EXAM: PORTABLE CHEST 1 VIEW COMPARISON:  Earlier same day FINDINGS: Endotracheal and partially imaged enteric tubes again identified. Stable interstitial prominence likely reflecting changes of COPD. No significant pleural effusion. No pneumothorax. Stable cardiomediastinal contours. IMPRESSION: Stable appearance. Interstitial prominence is nonspecific and could reflect chronic changes of COPD. Electronically Signed   By: Macy Mis M.D.   On: 06/28/2020 09:44   ECHOCARDIOGRAM COMPLETE  Result Date: 06/29/2020    ECHOCARDIOGRAM REPORT   Patient Name:   CAIUS SILBERNAGEL Date of Exam: 06/29/2020 Medical Rec #:  [id number]    Height:       68.0 in Accession #:    [id number]   Weight:       140.0 lb Date of Birth:  [birth year]   BSA:          1.756 m Patient Age:    79 years     BP:           128/48 mmHg Patient Gender: M            HR:           80 bpm. Exam Location:  [facility] Procedure: 2D Echo Indications:    Dyspnea 786.09 / R06.00  History:        Patient has no prior history of Echocardiogram examinations.                 Risk Factors:Current Smoker. Acute Respiratory Failue, Gastric                 Perforation, CHB, Acute Kidney Injury.  Sonographer:    Leavy Cella RDCS (AE) Referring Phys: [id number] [place]  1. Left ventricular ejection fraction, by estimation, is 55 to 60%. The left ventricle has normal function. The left  ventricle has no regional wall motion abnormalities. There is mild left ventricular hypertrophy. Left ventricular diastolic parameters were normal.  2. Right ventricular systolic function is normal. The right ventricular size is normal.  3. The mitral valve is normal in structure. No evidence of mitral valve regurgitation. No evidence of mitral stenosis.  4. The aortic valve is tricuspid. There is mild calcification of the aortic valve. There is mild thickening of the aortic valve. Aortic valve regurgitation is not visualized. No aortic stenosis is present.  5. At least moderate pulmonary HTN, limited evaluation due to poorly visualized IVC.  6. The inferior vena cava is normal in size with greater than 50% respiratory variability, suggesting right atrial pressure of 3 mmHg. FINDINGS  Left Ventricle: Left ventricular ejection fraction, by estimation, is 55 to 60%. The left ventricle has normal function. The left ventricle has no regional wall  motion abnormalities. The left ventricular internal cavity size was normal in size. There is  mild left ventricular hypertrophy. Left ventricular diastolic parameters were normal. Right Ventricle: The right ventricular size is normal. No increase in right ventricular wall thickness. Right ventricular systolic function is normal. Left Atrium: Left atrial size was normal in size. Right Atrium: Right atrial size was normal in size. Pericardium: There is no evidence of pericardial effusion. Mitral Valve: The mitral valve is normal in structure. There is mild thickening of the mitral valve leaflet(s). There is mild calcification of the mitral valve leaflet(s). Mild mitral annular calcification. No evidence of mitral valve regurgitation. No evidence of mitral valve stenosis. Tricuspid Valve: The tricuspid valve is normal in structure. Tricuspid valve regurgitation is mild . No evidence of tricuspid stenosis. Aortic Valve: The aortic valve is tricuspid. There is mild calcification of  the aortic valve. There is mild thickening of the aortic valve. There is mild aortic valve annular calcification. Aortic valve regurgitation is not visualized. No aortic stenosis  is present. Aortic valve mean gradient measures 4.7 mmHg. Aortic valve peak gradient measures 8.7 mmHg. Aortic valve area, by VTI measures 2.23 cm. Pulmonic Valve: The pulmonic valve was not well visualized. Pulmonic valve regurgitation is not visualized. No evidence of pulmonic stenosis. Aorta: The aortic root is normal in size and structure. Pulmonary Artery: At least moderate pulmonary HTN, limited evaluation due to poorly visualized IVC. Venous: The inferior vena cava is normal in size with greater than 50% respiratory variability, suggesting right atrial pressure of 3 mmHg. IAS/Shunts: The interatrial septum was not well visualized.  LEFT VENTRICLE PLAX 2D LVIDd:         4.10 cm  Diastology LVIDs:         3.37 cm  LV e' medial:    13.40 cm/s LV PW:         1.10 cm  LV E/e' medial:  7.6 LV IVS:        1.09 cm  LV e' lateral:   11.70 cm/s LVOT diam:     1.90 cm  LV E/e' lateral: 8.7 LV SV:         57 LV SV Index:   33 LVOT Area:     2.84 cm  RIGHT VENTRICLE RV S prime:     10.00 cm/s TAPSE (M-mode): 2.3 cm LEFT ATRIUM             Index       RIGHT ATRIUM           Index LA diam:        3.10 cm 1.77 cm/m  RA Area:     14.30 cm LA Vol (A2C):   41.2 ml 23.46 ml/m RA Volume:   32.60 ml  18.56 ml/m LA Vol (A4C):   45.8 ml 26.08 ml/m LA Biplane Vol: 46.9 ml 26.70 ml/m  AORTIC VALVE AV Area (Vmax):    2.35 cm AV Area (Vmean):   2.29 cm AV Area (VTI):     2.23 cm AV Vmax:           147.68 cm/s AV Vmean:          100.148 cm/s AV VTI:            0.257 m AV Peak Grad:      8.7 mmHg AV Mean Grad:      4.7 mmHg LVOT Vmax:         122.52 cm/s LVOT Vmean:  80.995 cm/s LVOT VTI:          0.202 m LVOT/AV VTI ratio: 0.79  AORTA Ao Root diam: 3.30 cm MITRAL VALVE                TRICUSPID VALVE MV Area (PHT): 3.65 cm     TR Peak grad:    44.4 mmHg MV Decel Time: 208 msec     TR Vmax:        333.00 cm/s MV E velocity: 102.00 cm/s MV A velocity: 44.30 cm/s   SHUNTS MV E/A ratio:  2.30         Systemic VTI:  0.20 m                             Systemic Diam: 1.90 cm Carlyle Dolly MD Electronically signed by Carlyle Dolly MD Signature Date/Time: 06/29/2020/11:25:24 AM    Final     Anti-infectives: Anti-infectives (From admission, onward)   Start     Dose/Rate Route Frequency Ordered Stop   06/29/20 1000  piperacillin-tazobactam (ZOSYN) IVPB 3.375 g        3.375 g 12.5 mL/hr over 240 Minutes Intravenous Every 8 hours 06/29/20 0907 07/02/20 0159   06/28/20 2200  piperacillin-tazobactam (ZOSYN) IVPB 3.375 g  Status:  Discontinued        3.375 g 12.5 mL/hr over 240 Minutes Intravenous Every 12 hours 06/28/20 1407 06/29/20 0907   06/28/20 0600  piperacillin-tazobactam (ZOSYN) IVPB 3.375 g  Status:  Discontinued        3.375 g 12.5 mL/hr over 240 Minutes Intravenous Every 8 hours 06/28/20 0412 06/28/20 1407   06/28/20 0130  cefoTEtan (CEFOTAN) 2 g in sodium chloride 0.9 % 100 mL IVPB        2 g 200 mL/hr over 30 Minutes Intravenous On call to O.R. 06/27/20 2323 06/28/20 0200   06/27/20 2215  piperacillin-tazobactam (ZOSYN) IVPB 3.375 g        3.375 g 100 mL/hr over 30 Minutes Intravenous  Once 06/27/20 2203 06/27/20 2305      Assessment/Plan: s/p Procedure(s): EXPLORATORY LAPAROTOMY, graham patch  LOS: 2 days    Pt on post-op day 2 s/p exploratory laparotomy w/ graham patch for perforated gastric ulcer w/ considerable pain today but stable labs and PE. Operative f/u as follows:  - NPO, no NG to be replaced, will hold Zosyn for 4 days post op - UGI tomorrow to see if ulcer sealed - TPN today - EGD as outpatient to look at area and ensure no mass - Empirically treat for H pylori once on PO   Raliegh Ip 06/30/2020

## 2020-07-01 ENCOUNTER — Inpatient Hospital Stay (HOSPITAL_COMMUNITY): Payer: Medicare Other

## 2020-07-01 LAB — COMPREHENSIVE METABOLIC PANEL
ALT: 17 U/L (ref 0–44)
AST: 21 U/L (ref 15–41)
Albumin: 2 g/dL — ABNORMAL LOW (ref 3.5–5.0)
Alkaline Phosphatase: 63 U/L (ref 38–126)
Anion gap: 9 (ref 5–15)
BUN: 33 mg/dL — ABNORMAL HIGH (ref 8–23)
CO2: 27 mmol/L (ref 22–32)
Calcium: 8.1 mg/dL — ABNORMAL LOW (ref 8.9–10.3)
Chloride: 105 mmol/L (ref 98–111)
Creatinine, Ser: 1.61 mg/dL — ABNORMAL HIGH (ref 0.61–1.24)
GFR, Estimated: 43 mL/min — ABNORMAL LOW (ref >60)
Glucose, Bld: 132 mg/dL — ABNORMAL HIGH (ref 70–99)
Potassium: 3.7 mmol/L (ref 3.5–5.1)
Sodium: 141 mmol/L (ref 135–145)
Total Bilirubin: 0.5 mg/dL (ref 0.3–1.2)
Total Protein: 5.4 g/dL — ABNORMAL LOW (ref 6.5–8.1)

## 2020-07-01 LAB — TYPE AND SCREEN
ABO/RH(D): A POS
Antibody Screen: NEGATIVE
Unit division: 0
Unit division: 0

## 2020-07-01 LAB — GLUCOSE, CAPILLARY
Glucose-Capillary: 104 mg/dL — ABNORMAL HIGH (ref 70–99)
Glucose-Capillary: 105 mg/dL — ABNORMAL HIGH (ref 70–99)
Glucose-Capillary: 116 mg/dL — ABNORMAL HIGH (ref 70–99)
Glucose-Capillary: 117 mg/dL — ABNORMAL HIGH (ref 70–99)
Glucose-Capillary: 118 mg/dL — ABNORMAL HIGH (ref 70–99)
Glucose-Capillary: 119 mg/dL — ABNORMAL HIGH (ref 70–99)

## 2020-07-01 LAB — CBC WITH DIFFERENTIAL/PLATELET
Abs Immature Granulocytes: 0.07 10*3/uL (ref 0.00–0.07)
Basophils Absolute: 0 10*3/uL (ref 0.0–0.1)
Basophils Relative: 0 %
Eosinophils Absolute: 0.4 10*3/uL (ref 0.0–0.5)
Eosinophils Relative: 3 %
HCT: 28.7 % — ABNORMAL LOW (ref 39.0–52.0)
Hemoglobin: 9.1 g/dL — ABNORMAL LOW (ref 13.0–17.0)
Immature Granulocytes: 1 %
Lymphocytes Relative: 8 %
Lymphs Abs: 1.1 10*3/uL (ref 0.7–4.0)
MCH: 30.8 pg (ref 26.0–34.0)
MCHC: 31.7 g/dL (ref 30.0–36.0)
MCV: 97.3 fL (ref 80.0–100.0)
Monocytes Absolute: 0.4 10*3/uL (ref 0.1–1.0)
Monocytes Relative: 3 %
Neutro Abs: 12 10*3/uL — ABNORMAL HIGH (ref 1.7–7.7)
Neutrophils Relative %: 85 %
Platelets: 284 10*3/uL (ref 150–400)
RBC: 2.95 MIL/uL — ABNORMAL LOW (ref 4.22–5.81)
RDW: 15 % (ref 11.5–15.5)
WBC: 14 10*3/uL — ABNORMAL HIGH (ref 4.0–10.5)
nRBC: 0 % (ref 0.0–0.2)

## 2020-07-01 LAB — BPAM RBC
Blood Product Expiration Date: 202111212359
Blood Product Expiration Date: 202111212359
Unit Type and Rh: 6200
Unit Type and Rh: 6200

## 2020-07-01 LAB — MAGNESIUM: Magnesium: 2.2 mg/dL (ref 1.7–2.4)

## 2020-07-01 LAB — HEMOGLOBIN A1C
Hgb A1c MFr Bld: 5.9 % — ABNORMAL HIGH (ref 4.8–5.6)
Mean Plasma Glucose: 123 mg/dL

## 2020-07-01 LAB — PHOSPHORUS: Phosphorus: 2.4 mg/dL — ABNORMAL LOW (ref 2.5–4.6)

## 2020-07-01 MED ORDER — TRAVASOL 10 % IV SOLN
INTRAVENOUS | Status: AC
  Filled 2020-07-01: qty 936

## 2020-07-01 MED ORDER — HYDROMORPHONE HCL 1 MG/ML IJ SOLN
0.5000 mg | INTRAMUSCULAR | Status: DC | PRN
  Administered 2020-07-01 – 2020-07-04 (×7): 1 mg via INTRAVENOUS
  Filled 2020-07-01 (×7): qty 1
  Filled 2020-07-01: qty 0.5
  Filled 2020-07-01: qty 1

## 2020-07-01 MED ORDER — INSULIN ASPART 100 UNIT/ML ~~LOC~~ SOLN
0.0000 [IU] | SUBCUTANEOUS | Status: DC
Start: 2020-07-01 — End: 2020-07-02
  Administered 2020-07-02: 1 [IU] via SUBCUTANEOUS

## 2020-07-01 MED ORDER — IOPAMIDOL (ISOVUE-300) INJECTION 61%
150.0000 mL | Freq: Once | INTRAVENOUS | Status: AC | PRN
  Administered 2020-07-01: 150 mL via ORAL

## 2020-07-01 MED ORDER — HYDRALAZINE HCL 20 MG/ML IJ SOLN
10.0000 mg | INTRAMUSCULAR | Status: DC | PRN
  Administered 2020-07-01 – 2020-07-10 (×9): 10 mg via INTRAVENOUS
  Filled 2020-07-01 (×7): qty 1

## 2020-07-01 MED ORDER — PIPERACILLIN-TAZOBACTAM 3.375 G IVPB
3.3750 g | Freq: Three times a day (TID) | INTRAVENOUS | Status: DC
  Administered 2020-07-01 – 2020-07-04 (×9): 3.375 g via INTRAVENOUS
  Filled 2020-07-01 (×9): qty 50

## 2020-07-01 MED ORDER — METRONIDAZOLE 500 MG PO TABS: 250.0000 mg | ORAL_TABLET | Freq: Four times a day (QID) | ORAL | Status: DC

## 2020-07-01 MED ORDER — HALOPERIDOL LACTATE 5 MG/ML IJ SOLN
2.0000 mg | Freq: Four times a day (QID) | INTRAMUSCULAR | Status: DC | PRN
  Filled 2020-07-01: qty 1

## 2020-07-01 MED ORDER — PANTOPRAZOLE SODIUM 40 MG PO TBEC
40.0000 mg | DELAYED_RELEASE_TABLET | Freq: Two times a day (BID) | ORAL | Status: DC
  Filled 2020-07-01: qty 1

## 2020-07-01 MED ORDER — OXYCODONE HCL 5 MG PO TABS: 5.0000 mg | ORAL_TABLET | ORAL | Status: DC | PRN

## 2020-07-01 MED ORDER — BISMUTH SUBSALICYLATE 262 MG/15ML PO SUSP
30.0000 mL | Freq: Three times a day (TID) | ORAL | Status: DC
  Filled 2020-07-01: qty 118

## 2020-07-01 MED ORDER — IPRATROPIUM-ALBUTEROL 0.5-2.5 (3) MG/3ML IN SOLN
3.0000 mL | Freq: Three times a day (TID) | RESPIRATORY_TRACT | Status: DC
  Administered 2020-07-02 – 2020-07-04 (×7): 3 mL via RESPIRATORY_TRACT
  Filled 2020-07-01 (×7): qty 3

## 2020-07-01 MED ORDER — TETRACYCLINE HCL 250 MG PO CAPS
500.0000 mg | ORAL_CAPSULE | Freq: Four times a day (QID) | ORAL | Status: DC
  Filled 2020-07-01 (×13): qty 2

## 2020-07-01 NOTE — Progress Notes (Addendum)
I was present with the medical student for this service. I personally verified the history of present illness, performed the physical exam, and made the plan for this encounter. I have verified the medical student's documentation and made modifications where appropriately. I have personally documented in my own words a brief history, physical, and plan below.     Events of today with patient getting UGI that was without leak. Went to remove JP and start clears, and patient had bile in the drain. KUB done and no obvious extravasation. CT done to see if there is a leak and there is a small leak. Patient having some more pain after UGI and leak now.  JP with bilious output NPO, NG to be replaced  TPN Zosyn now to continue, updated pharmacist  Labs  UOP and AKI improving  Will likely wait to see how the JP drain output changes, and hopefully repeat UGI next week   Discussed with Dr. Wynetta Emery and he helped to update the family.  Dr. Christian Mate updated on the patient and will be rounding this weekend.   Curlene Labrum, MD [facility] [place], [place] [address] [phone] (office)    [facility] Progress Note  3 Days Post-Op  Subjective: Pt reports improved pain control and nausea w/ some persistent, intermittent incisional pain. No ON events. Pt eager to begin drinking, eating when able.   Objective: Vital signs in last 24 hours: Temp:  [97.9 F (36.6 C)-98.3 F (36.8 C)] 98.3 F (36.8 C) (10/29 0757) Pulse Rate:  [55-94] 55 (10/29 1100) Resp:  [16-28] 27 (10/29 1100) BP: (152-228)/(65-178) 228/178 (10/29 1100) SpO2:  [72 %-100 %] 72 % (10/29 1100) Last BM Date:  (UTA)  Intake/Output from previous day: 10/28 0701 - 10/29 0700 In: 1104.7 [I.V.:926.3; IV Piggyback:178.4] Out: 1530 [Urine:1400; Drains:130] Intake/Output this shift: Total I/O In: 370.2 [I.V.:337.8; IV Piggyback:32.4] Out: -  Urine output:0.83  ml/kg/hr  General appearance: alert, cooperative and mild distress Head: Normocephalic, without obvious abnormality, atraumatic Cardio: S1, S2 normal GI: soft, bowel sounds appreciated, some TTP along incision site Incision/Wound:c/d/i   JP drain: serosanguinous w/ minimal continued output  Lab Results:  Recent Labs    06/30/20 0447 07/01/20 0412  WBC 12.5* 14.0*  HGB 8.5* 9.1*  HCT 26.8* 28.7*  PLT 253 284   BMET Recent Labs    06/30/20 0447 07/01/20 0412  NA 140 141  K 3.9 3.7  CL 105 105  CO2 26 27  GLUCOSE 107* 132*  BUN 43* 33*  CREATININE 1.82* 1.61*  CALCIUM 7.8* 8.1*   PT/INR No results for input(s): LABPROT, INR in the last 72 hours.  Studies/Results: DG UGI W SINGLE CM (SOL OR THIN BA)  Result Date: 07/01/2020 CLINICAL DATA:  Post exploratory laparotomy and Phillip Heal patch of a perforated gastric ulcer at anterior wall of lesser curve EXAM: WATER SOLUBLE UPPER GI SERIES TECHNIQUE: Single-column upper GI series was performed using water soluble contrast. CONTRAST:  137mL ISOVUE-300 IOPAMIDOL (ISOVUE-300) INJECTION 61% orally COMPARISON:  CT abdomen pelvis 06/27/2020 FLUOROSCOPY TIME:  Fluoroscopy Time:  1 minutes 48 seconds Radiation Exposure Index (if provided by the fluoroscopic device): 32.0 mGy Number of Acquired Spot Images: 2 plus multiple fluoroscopic screen captures FINDINGS: Scout image demonstrates normal bowel gas pattern. Jackson-Pratt drain identified extending from duodenal bulb region along lesser curve. Stomach distends normally. Esophageal dysmotility noted. No gastric outlet obstruction. Mild edema is identified at distal gastric antrum especially at lesser curve. No contrast  extravasation identified. No contrast accumulation along the surgical drain. Duodenum and visualized jejunal loops unremarkable. IMPRESSION: Edema at distal stomach corresponding to site of repair of a perforated gastric ulcer. No contrast extravasation or gastric outlet  obstruction identified. Electronically Signed   By: Lavonia Dana M.D.   On: 07/01/2020 11:34    Anti-infectives: Anti-infectives (From admission, onward)   Start     Dose/Rate Route Frequency Ordered Stop   06/29/20 1000  piperacillin-tazobactam (ZOSYN) IVPB 3.375 g        3.375 g 12.5 mL/hr over 240 Minutes Intravenous Every 8 hours 06/29/20 0907 07/02/20 0159   06/28/20 2200  piperacillin-tazobactam (ZOSYN) IVPB 3.375 g  Status:  Discontinued        3.375 g 12.5 mL/hr over 240 Minutes Intravenous Every 12 hours 06/28/20 1407 06/29/20 0907   06/28/20 0600  piperacillin-tazobactam (ZOSYN) IVPB 3.375 g  Status:  Discontinued        3.375 g 12.5 mL/hr over 240 Minutes Intravenous Every 8 hours 06/28/20 0412 06/28/20 1407   06/28/20 0130  cefoTEtan (CEFOTAN) 2 g in sodium chloride 0.9 % 100 mL IVPB        2 g 200 mL/hr over 30 Minutes Intravenous On call to O.R. 06/27/20 2323 06/28/20 0200   06/27/20 2215  piperacillin-tazobactam (ZOSYN) IVPB 3.375 g        3.375 g 100 mL/hr over 30 Minutes Intravenous  Once 06/27/20 2203 06/27/20 2305      Assessment/Plan: s/p Procedure(s): EXPLORATORY LAPAROTOMY, graham patch  LOS: 3 days    Pt on post-op day 3 following exploratory laparotomy w/ graham patch for ruptured stomach ulcer w/ improved pain control, adequate UOP, stable labs, and reassuring upper GI today showing edema at patch site with no extravasation or gastric outlet obstruction. Plan as follows:  See above    Raliegh Ip 07/01/2020

## 2020-07-01 NOTE — Progress Notes (Addendum)
Pharmacy Antibiotic Note  Today is day #4 out of a total of  10 days of  Zosyn therapy for this 79 yo male with bowel perforation.  Blood and urine cultures have shown no growth to date and wound culture is being held for possible anaerobic growth.  WBC count remains elevated, but patient is afebrile.   Renal function is unstable, but SCr is trending down.  Plan: Continue Zosyn 3.375G IV q8h -->total 10 days of therapy Monitor labs, cultures and patient progress.    Height: 5\' 8"  (172.7 cm) Weight: 70.1 kg (154 lb 8.7 oz) IBW/kg (Calculated) : 68.4  Temp (24hrs), Avg:98.2 F (36.8 C), Min:97.9 F (36.6 C), Max:98.6 F (37 C)  Recent Labs  Lab 06/27/20 2009 06/27/20 2009 06/28/20 0522 06/28/20 1016 06/28/20 1521 06/28/20 1652 06/29/20 0101 06/29/20 0437 06/30/20 0447 07/01/20 0412  WBC 8.7  --  4.3  --   --   --   --  12.0* 12.5* 14.0*  CREATININE 2.42*   < > 2.52*   < >  --  2.99* 2.59* 2.40* 1.82* 1.61*  LATICACIDVEN  --   --   --   --  3.1* 2.3*  --   --   --   --    < > = values in this interval not displayed.    Estimated Creatinine Clearance: 35.4 mL/min (A) (by C-G formula based on SCr of 1.61 mg/dL (H)).    No Known Allergies   Antimicrobials:  Zosyn 10/26>> for a total of 10 days-->last dose on 11/1 at 1700  Microbiology results: 10/26 BC x2: NG x2 days 10/26 Ucx: NGF 10/26 Peritoneal Cx: No org seen-->holding for poss anaerobe 1026 Resp PCR: SARS CoV-2 negative; Flu A/B negative     Thank you for allowing pharmacy to participate in this patient's care.  Despina Pole, Pharm. D. Clinical Pharmacist 07/01/2020 10:44 AM

## 2020-07-01 NOTE — Progress Notes (Signed)
07/01/2020 2:51 PM  I called and updated patient's family about tiny leak found on CT and plan of care per Dr. Constance Haw.  Primary contact person is patient's Daughter Theadora Rama in [place].  I spoke with [place] and patient's friend [place].    Murvin Natal MD  How to contact the [facility] Attending or Consulting provider [place] or covering provider during after hours [place], for this patient?  1. Check the care team in [facility] and look for a) attending/consulting TR[facility] provider listed and b) the [facility] team listed 2. Log into [email or web address] and use [place]'s universal password to access. If you do not have the password, please contact the hospital operator. 3. Locate the [facility] provider you are looking for under [facility] and page to a number that you can be directly reached. 4. If you still have difficulty reaching the provider, please page the [facility] (Director on Call) for the Hospitalists listed on amion for assistance.

## 2020-07-01 NOTE — Progress Notes (Addendum)
PHARMACY - TOTAL PARENTERAL NUTRITION CONSULT NOTE   Indication: gastric perforation  Patient Measurements: Height: 5\' 8"  (172.7 cm) Weight: 70.1 kg (154 lb 8.7 oz) IBW/kg (Calculated) : 68.4 TPN AdjBW (KG): 63.5 Body mass index is 23.5 kg/m. Usual Weight:    Assessment: Per RD note: Inadequate oral intake related to acute illness (gastric ulcer perforation s/p ex-lap with omental patch) as evidenced by NPO status.  Glucose / Insulin: RBG 132-->  SSI q4h sensitive scale ordered Electrolytes:  Na 141   K 3.7   Cl 105    Ca corr: 9.3    Mg: 2.2 Renal:  Scr 1.61mg /dL LFT:  21/17  TG: 129 Prealbumin:  6.9  Albumin: 2.0 Intake / Output:   Intake/Output Summary (Last 24 hours) at 07/01/2020 0913 Last data filed at 07/01/2020 0800 Gross per 24 hour  Intake 1358.18 ml  Output 1530 ml  Net -171.82 ml    MIVF:  D5NS @40ml /hr-->dc at 1700 on 10-29 GI Imaging:  10/29 UGI found continued leak, so TPN to continue Surgeries / Procedures:   POD #3 s/p exp laparotomy w/ omental patch for perforated gastric ulcer  Central access:  CVC Triple Lumen placed 06/28/20 -->rt internal jugular TPN start date:  06-30-20 at 1800  Nutritional Goals (per RD recommendation on 06-30-20): kCal: [phone]  Protein: 84-95g  Fluid: >1700 mL Goal TPN rate is 75 mL/hr (provides 85 g of protein and ~1868 kcals per day)  Current Nutrition:  NG dislodged  10-29 Plan is to try to advance diet today to clears   Plan:  Increase TPN to 75 mL/hr at 1800 (goal rate increased slightly to meet caloric needs) Electrolytes in TPN: 12mEq/L of Na, 78mEq/L of K, 61mEq/L of Ca, 44mEq/L of Mg, and 20 mmol/L of Phos.  Cl:Ac ratio 1:1 Add standard MVI and trace elements to TPN Initiate Sensitive q4h SSI and adjust as needed  Reduce MIVF to 40 mL/hr at 1800 Monitor TPN labs on Mon/Thurs   Despina Pole 07/01/2020,9:13 AM

## 2020-07-01 NOTE — Progress Notes (Signed)
PROGRESS NOTE   Frederick [name]  VXB:[id number] DOB: 22-Jun-1941 DOA: 06/27/2020 PCP: Patient, No Pcp Per   Chief Complaint  Patient presents with  . Abdominal Pain   Brief Admission History:  79 y.o. male with no documented chronic medical problems presenting with 2-week history of abdominal pain that was intermittent in nature, occasionally worse with food.  The patient has not seen a physician for nearly 5 years.  He does not take any prescription medications.  Apparently, the patient ate some chocolate on 06/27/2020 after which he had unrelenting epigastric abdominal pain.  There was some nausea without any emesis.  The patient denies any NSAIDs.  Because of his abdominal pain, the patient presented for further evaluation.  He had denied any chest pain, shortness breath, diarrhea, dysuria.  In the emergency department, the patient was afebrile with soft blood pressures.  CT of the abdomen and pelvis showed moderate free air in the upper abdomen with a small amount of free fluid consistent with perforated viscus.  It was suspected that this was likely in the prepyloric region of the stomach.  General surgery was consulted.  The patient was taken to the operating room and underwent an exploratory laparotomy with vascular omental patch placement.  Unfortunately, there was difficulty extubating the patient postoperatively.  On the morning of 06/28/2020, the patient began developing hypotension.  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    Assessment & Plan:   Principal Problem:   Gastric perforation, acute Active Problems:   Gastric perforation (HCC)   Acute respiratory failure with hypoxia (HCC)   Hyperkalemia   AKI (acute kidney injury) ([place])   Complete heart block (HCC)   Hypotension   Endotracheally intubated   Perforated abdominal viscus  1. POD#3 s/p Exp laparotomy with omental patch for perforated gastric ulcer - Pt is recovering well postop, blood pressures  improved, he is now extubated and off dobutamine.  Consulted to pharm D for TPN infusion started 10/28.  UGI study 10/29 did not show any leaks.  Unfortunately he has bilious output from drain.  He will need to continue NPO status for now.  Continue TPN per surgery team.  NG tube per surgery recs.   2. Hypotension - resolved now.  He was briefly on dobutamine infusion which is now discontinued. BPs are much improved.   3. Hypertension - IV hydralazine ordered.   4. Acute respiratory failure with hypoxia - Pt has now been extubated and on nasal cannula.  PCCM consulted.   5. Hyperkalemia - RESOLVED.  Treated aggressively and resolved now, following.  6. AV block - Appreciate cardiology consultation. Follow for now.  Arrange home cardiac monitor at discharge.   7. Stage 4 CKD - creatinine improved with IV fluids and supportive measures.  Following.  8. Acute delirium - haldol IV prn.  Delirium precautions.    DVT prophylaxis: sQ heparin  Code Status:  Full  Family Communication: friend updated by telephone Disposition: TBD   Status is: Inpatient  Remains inpatient appropriate because:Hemodynamically unstable, Persistent severe electrolyte disturbances, IV treatments appropriate due to intensity of illness or inability to take PO and Inpatient level of care appropriate due to severity of illness  Dispo: The patient is from: Home              Anticipated d/c is to: TBD              Anticipated d/c date is: > 3 days  Patient currently is not medically stable to d/c.  Consultants:   Cardiology  pccm  Surgery   Procedures:   Central line placement 10/26  Exp lap with omental patch 10/26  Antimicrobials:  Zosyn 10/26>>  Subjective: Pt having more agitation, more abd pain today and now with bilious output from drain    Objective: Vitals:   07/01/20 1115 07/01/20 1145 07/01/20 1200 07/01/20 1212  BP: (!) 199/100 (!) 171/84 (!) 185/90   Pulse: 97 95 95   Resp:  17 19    Temp:    97.7 F (36.5 C)  TempSrc:    Oral  SpO2: 92% 96% 97%   Weight:      Height:        Intake/Output Summary (Last 24 hours) at 07/01/2020 1304 Last data filed at 07/01/2020 1100 Gross per 24 hour  Intake 1474.88 ml  Output 1500 ml  Net -25.12 ml   Filed Weights   06/27/20 1945 06/29/20 1700  Weight: 63.5 kg 70.1 kg   Examination:  General exam: elderly chronically ill appearing male, awake, alert, NAD, Appears agitated.   Respiratory system: Clear to auscultation. Respiratory effort normal. Cardiovascular system: normal S1 & S2 heard. No JVD, murmurs, rubs, gallops or clicks. No pedal edema. Gastrointestinal system: Abdomen is nondistended, soft and light incisional tenderness, wound clean and dry and intact. No organomegaly or masses felt. hypoactive BS.  Central nervous system: Alert and oriented. No focal neurological deficits. Extremities: Symmetric 5 x 5 power. Skin: No rashes, lesions or ulcers Psychiatry: Judgement and insight appear normal. Mood & affect appropriate.   Data Reviewed: I have personally reviewed following labs and imaging studies  CBC: Recent Labs  Lab 06/27/20 2009 06/28/20 0522 06/29/20 0437 06/30/20 0447 07/01/20 0412  WBC 8.7 4.3 12.0* 12.5* 14.0*  NEUTROABS 7.7 2.5  --  10.7* 12.0*  HGB 12.3* 11.6* 9.9* 8.5* 9.1*  HCT 38.2* 38.2* 30.9* 26.8* 28.7*  MCV 97.2 103.5* 97.2 97.5 97.3  PLT 392 316 294 253 161    Basic Metabolic Panel: Recent Labs  Lab 06/28/20 0522 06/28/20 1016 06/28/20 1652 06/29/20 0101 06/29/20 0437 06/30/20 0447 07/01/20 0412  NA 134*   < > 135 137 137 140 141  K 5.2*   < > 5.6* 4.6 4.4 3.9 3.7  CL 104   < > 101 103 103 105 105  CO2 21*   < > 21* 24 26 26 27   GLUCOSE 107*   < > 96 77 84 107* 132*  BUN 56*   < > 58* 55* 54* 43* 33*  CREATININE 2.52*   < > 2.99* 2.59* 2.40* 1.82* 1.61*  CALCIUM 7.7*   < > 7.7* 7.8* 7.7* 7.8* 8.1*  MG 2.5*  --   --   --  2.3 2.3 2.2  PHOS 5.1*  --   --   --  4.3 3.1  2.4*   < > = values in this interval not displayed.    GFR: Estimated Creatinine Clearance: 35.4 mL/min (A) (by C-G formula based on SCr of 1.61 mg/dL (H)).  Liver Function Tests: Recent Labs  Lab 06/27/20 2009 06/29/20 0437 06/30/20 0447 07/01/20 0412  AST 21 27 21 21   ALT 14 21 17 17   ALKPHOS 47 30* 37* 63  BILITOT 1.1 0.7 0.5 0.5  PROT 7.2 5.2* 4.9* 5.4*  ALBUMIN 3.7 2.2* 1.9* 2.0*    CBG: Recent Labs  Lab 06/30/20 2003 06/30/20 2304 07/01/20 0449 07/01/20 0742 07/01/20 1201  GLUCAP  123* 121* 116* 117* 105*    Recent Results (from the past 240 hour(s))  Respiratory Panel by RT PCR (Flu A&B, Covid) - Nasopharyngeal Swab     Status: None   Collection Time: 06/27/20  9:48 PM   Specimen: Nasopharyngeal Swab  Result Value Ref Range Status   SARS Coronavirus 2 by RT PCR NEGATIVE NEGATIVE Final    Comment: (NOTE) SARS-CoV-2 target nucleic acids are NOT DETECTED.  The SARS-CoV-2 RNA is generally detectable in upper respiratoy specimens during the acute phase of infection. The lowest concentration of SARS-CoV-2 viral copies this assay can detect is 131 copies/mL. A negative result does not preclude SARS-Cov-2 infection and should not be used as the sole basis for treatment or other patient management decisions. A negative result may occur with  improper specimen collection/handling, submission of specimen other than nasopharyngeal swab, presence of viral mutation(s) within the areas targeted by this assay, and inadequate number of viral copies (<131 copies/mL). A negative result must be combined with clinical observations, patient history, and epidemiological information. The expected result is Negative.  Fact Sheet for Patients:  PinkCheek.be  Fact Sheet for Healthcare Providers:  GravelBags.it  This test is no t yet approved or cleared by the [place] FDA and  has been authorized for detection  and/or diagnosis of SARS-CoV-2 by FDA under an Emergency Use Authorization (EUA). This EUA will remain  in effect (meaning this test can be used) for the duration of the COVID-19 declaration under Section 564(b)(1) of the Act, 21 U.S.C. section 360bbb-3(b)(1), unless the authorization is terminated or revoked sooner.     Influenza A by PCR NEGATIVE NEGATIVE Final   Influenza B by PCR NEGATIVE NEGATIVE Final    Comment: (NOTE) The Xpert Xpress SARS-CoV-2/FLU/RSV assay is intended as an aid in  the diagnosis of influenza from Nasopharyngeal swab specimens and  should not be used as a sole basis for treatment. Nasal washings and  aspirates are unacceptable for Xpert Xpress SARS-CoV-2/FLU/RSV  testing.  Fact Sheet for Patients: PinkCheek.be  Fact Sheet for Healthcare Providers: GravelBags.it  This test is not yet approved or cleared by the [place] FDA and  has been authorized for detection and/or diagnosis of SARS-CoV-2 by  FDA under an Emergency Use Authorization (EUA). This EUA will remain  in effect (meaning this test can be used) for the duration of the  Covid-19 declaration under Section 564(b)(1) of the Act, 21  U.S.C. section 360bbb-3(b)(1), unless the authorization is  terminated or revoked. Performed at [facility], [address], [place], [place] [address]   Aerobic/Anaerobic Culture (surgical/deep wound)     Status: None (Preliminary result)   Collection Time: 06/28/20  2:45 AM   Specimen: PATH Other; Tissue  Result Value Ref Range Status   Specimen Description   Final    PERITONEAL Performed at [facility], [address], [place], [place] [address]    Special Requests   Final    GASTRIC ULCER PREFORATION Performed at [facility], [address], [place], [place] [address]    Gram Stain   Final    RARE WBC PRESENT, PREDOMINANTLY PMN NO ORGANISMS SEEN    Culture   Final    RARE Multiple Species  Consistent With Normal Fecal Flora HOLDING FOR POSSIBLE ANAEROBE Performed at [facility], [address], [place],  [address]    Report Status PENDING  Incomplete  Culture, blood (Routine X 2) w Reflex to ID Panel  Status: None (Preliminary result)   Collection Time: 06/28/20  3:21 PM   Specimen: BLOOD  Result Value Ref Range Status   Specimen Description BLOOD LEFT ANTECUBITAL  Final   Special Requests   Final    BOTTLES DRAWN AEROBIC AND ANAEROBIC Blood Culture adequate volume   Culture   Final    NO GROWTH 3 DAYS Performed at [facility], [address], [place], [place] [address]    Report Status PENDING  Incomplete  Culture, blood (Routine X 2) w Reflex to ID Panel     Status: None (Preliminary result)   Collection Time: 06/28/20  3:21 PM   Specimen: BLOOD LEFT HAND  Result Value Ref Range Status   Specimen Description BLOOD LEFT HAND  Final   Special Requests   Final    BOTTLES DRAWN AEROBIC AND ANAEROBIC Blood Culture adequate volume   Culture   Final    NO GROWTH 3 DAYS Performed at [facility], [address], [place], [place] [address]    Report Status PENDING  Incomplete  Culture, Urine     Status: None   Collection Time: 06/28/20  3:27 PM   Specimen: Urine, Clean Catch  Result Value Ref Range Status   Specimen Description   Final    URINE, CLEAN CATCH Performed at [facility], [address], [place], [place] [address]    Special Requests   Final    NONE Performed at [facility], [address], [place], [place] [address]    Culture   Final    NO GROWTH Performed at [facility], [place] [address] [place], [place] [address]    Report Status 06/30/2020 FINAL  Final     Radiology Studies: DG UGI W SINGLE CM (SOL OR THIN BA)  Result Date: 07/01/2020 CLINICAL DATA:  Post exploratory laparotomy and Phillip Heal patch of a perforated gastric ulcer at anterior wall of lesser curve EXAM: WATER SOLUBLE UPPER GI SERIES TECHNIQUE: Single-column  upper GI series was performed using water soluble contrast. CONTRAST:  154m ISOVUE-300 IOPAMIDOL (ISOVUE-300) INJECTION 61% orally COMPARISON:  CT abdomen pelvis 06/27/2020 FLUOROSCOPY TIME:  Fluoroscopy Time:  1 minutes 48 seconds Radiation Exposure Index (if provided by the fluoroscopic device): 32.0 mGy Number of Acquired Spot Images: 2 plus multiple fluoroscopic screen captures FINDINGS: Scout image demonstrates normal bowel gas pattern. Jackson-Pratt drain identified extending from duodenal bulb region along lesser curve. Stomach distends normally. Esophageal dysmotility noted. No gastric outlet obstruction. Mild edema is identified at distal gastric antrum especially at lesser curve. No contrast extravasation identified. No contrast accumulation along the surgical drain. Duodenum and visualized jejunal loops unremarkable. IMPRESSION: Edema at distal stomach corresponding to site of repair of a perforated gastric ulcer. No contrast extravasation or gastric outlet obstruction identified. Electronically Signed   By: MLavonia DanaM.D.   On: 07/01/2020 11:34   Scheduled Meds: . chlorhexidine gluconate (MEDLINE KIT)  15 mL Mouth Rinse BID  . Chlorhexidine Gluconate Cloth  6 each Topical Daily  . heparin injection (subcutaneous)  5,000 Units Subcutaneous Q8H  . insulin aspart  0-9 Units Subcutaneous Q4H  . ipratropium-albuterol  3 mL Nebulization TID  . mouth rinse  15 mL Mouth Rinse BID  . pantoprazole  40 mg Oral BID   Continuous Infusions: . dextrose 5 % and 0.9% NaCl 40 mL/hr at 06/30/20 1726  . piperacillin-tazobactam (ZOSYN)  IV    . TPN ADULT (ION) 40 mL/hr at 06/30/20 1720  . TPN ADULT (ION)  LOS: 3 days   Critical Care Procedure Note Authorized and Performed by: Murvin Natal MD  Total Critical Care time:  37 minutes  Due to a high probability of clinically significant, life threatening deterioration, the patient required my highest level of preparedness to intervene emergently and I  personally spent this critical care time directly and personally managing the patient.  This critical care time included obtaining a history; examining the patient, pulse oximetry; ordering and review of studies; arranging urgent treatment with development of a management plan; evaluation of patient's response of treatment; frequent reassessment; and discussions with other providers.  This critical care time was performed to assess and manage the high probability of imminent and life threatening deterioration that could result in multi-organ failure.  It was exclusive of separately billable procedures and treating other patients and teaching time.   Irwin Brakeman, MD How to contact the [facility] Attending or Consulting provider [place] or covering provider during after hours [place], for this patient?  1. Check the care team in [facility] and look for a) attending/consulting [facility] provider listed and b) the [facility] team listed 2. Log into [email or web address] and use [place]'s universal password to access. If you do not have the password, please contact the hospital operator. 3. Locate the [facility] provider you are looking for under [facility] and page to a number that you can be directly reached. 4. If you still have difficulty reaching the provider, please page the [facility] (Director on Call) for the Hospitalists listed on amion for assistance.  07/01/2020, 1:04 PM

## 2020-07-01 NOTE — Progress Notes (Signed)
Attempted to place 16 french NG tube twice but failed. Attempted both nostrils with 12 french and could not pass at all left nostril and failed with right nostril.

## 2020-07-01 NOTE — Progress Notes (Signed)
Telemetry reviewed, primarily SR with long PR interval, can have some Wenchebach block but no high grade block. No recurrent complete heart block as he had on admission. No further cardiology inpatient workup, reassess as outpatient as he recovers from his severe systemic illness. May warrant outpatient monitor to see if any recurrent high grade block in absence of his severe systemic illness. We will sign off inpatient care and arrange outpatient f/u.    Carlyle Dolly MD

## 2020-07-02 DIAGNOSIS — E861 Hypovolemia: Secondary | ICD-10-CM

## 2020-07-02 DIAGNOSIS — I9589 Other hypotension: Secondary | ICD-10-CM

## 2020-07-02 LAB — CBC WITH DIFFERENTIAL/PLATELET
Abs Immature Granulocytes: 0.2 10*3/uL — ABNORMAL HIGH (ref 0.00–0.07)
Basophils Absolute: 0 10*3/uL (ref 0.0–0.1)
Basophils Relative: 0 %
Eosinophils Absolute: 0.3 10*3/uL (ref 0.0–0.5)
Eosinophils Relative: 3 %
HCT: 27 % — ABNORMAL LOW (ref 39.0–52.0)
Hemoglobin: 8.9 g/dL — ABNORMAL LOW (ref 13.0–17.0)
Immature Granulocytes: 2 %
Lymphocytes Relative: 11 %
Lymphs Abs: 1.4 10*3/uL (ref 0.7–4.0)
MCH: 31.6 pg (ref 26.0–34.0)
MCHC: 33 g/dL (ref 30.0–36.0)
MCV: 95.7 fL (ref 80.0–100.0)
Monocytes Absolute: 0.8 10*3/uL (ref 0.1–1.0)
Monocytes Relative: 6 %
Neutro Abs: 10 10*3/uL — ABNORMAL HIGH (ref 1.7–7.7)
Neutrophils Relative %: 78 %
Platelets: 277 10*3/uL (ref 150–400)
RBC: 2.82 MIL/uL — ABNORMAL LOW (ref 4.22–5.81)
RDW: 15.1 % (ref 11.5–15.5)
WBC: 12.8 10*3/uL — ABNORMAL HIGH (ref 4.0–10.5)
nRBC: 0 % (ref 0.0–0.2)

## 2020-07-02 LAB — COMPREHENSIVE METABOLIC PANEL
ALT: 16 U/L (ref 0–44)
AST: 22 U/L (ref 15–41)
Albumin: 1.9 g/dL — ABNORMAL LOW (ref 3.5–5.0)
Alkaline Phosphatase: 97 U/L (ref 38–126)
Anion gap: 9 (ref 5–15)
BUN: 31 mg/dL — ABNORMAL HIGH (ref 8–23)
CO2: 27 mmol/L (ref 22–32)
Calcium: 8.2 mg/dL — ABNORMAL LOW (ref 8.9–10.3)
Chloride: 105 mmol/L (ref 98–111)
Creatinine, Ser: 1.39 mg/dL — ABNORMAL HIGH (ref 0.61–1.24)
GFR, Estimated: 51 mL/min — ABNORMAL LOW (ref >60)
Glucose, Bld: 124 mg/dL — ABNORMAL HIGH (ref 70–99)
Potassium: 3.4 mmol/L — ABNORMAL LOW (ref 3.5–5.1)
Sodium: 141 mmol/L (ref 135–145)
Total Bilirubin: 0.8 mg/dL (ref 0.3–1.2)
Total Protein: 5.2 g/dL — ABNORMAL LOW (ref 6.5–8.1)

## 2020-07-02 LAB — GLUCOSE, CAPILLARY
Glucose-Capillary: 130 mg/dL — ABNORMAL HIGH (ref 70–99)
Glucose-Capillary: 114 mg/dL — ABNORMAL HIGH (ref 70–99)
Glucose-Capillary: 127 mg/dL — ABNORMAL HIGH (ref 70–99)
Glucose-Capillary: 137 mg/dL — ABNORMAL HIGH (ref 70–99)
Glucose-Capillary: 124 mg/dL — ABNORMAL HIGH (ref 70–99)

## 2020-07-02 LAB — MAGNESIUM: Magnesium: 2 mg/dL (ref 1.7–2.4)

## 2020-07-02 LAB — PHOSPHORUS: Phosphorus: 2.3 mg/dL — ABNORMAL LOW (ref 2.5–4.6)

## 2020-07-02 MED ORDER — ACETAMINOPHEN 10 MG/ML IV SOLN
1000.0000 mg | Freq: Once | INTRAVENOUS | Status: AC
  Administered 2020-07-02: 1000 mg via INTRAVENOUS
  Filled 2020-07-02 (×2): qty 100

## 2020-07-02 MED ORDER — PANTOPRAZOLE SODIUM 40 MG IV SOLR
40.0000 mg | Freq: Two times a day (BID) | INTRAVENOUS | Status: DC
  Administered 2020-07-02 – 2020-07-12 (×21): 40 mg via INTRAVENOUS
  Filled 2020-07-02 (×22): qty 40

## 2020-07-02 MED ORDER — INSULIN ASPART 100 UNIT/ML ~~LOC~~ SOLN
0.0000 [IU] | Freq: Three times a day (TID) | SUBCUTANEOUS | Status: DC
  Administered 2020-07-02 – 2020-07-11 (×8): 1 [IU] via SUBCUTANEOUS

## 2020-07-02 MED ORDER — TRAVASOL 10 % IV SOLN
INTRAVENOUS | Status: AC
  Filled 2020-07-02: qty 921.6

## 2020-07-02 MED ORDER — POTASSIUM PHOSPHATES 15 MMOLE/5ML IV SOLN
10.0000 mmol | Freq: Once | INTRAVENOUS | Status: AC
  Administered 2020-07-02: 10 mmol via INTRAVENOUS
  Filled 2020-07-02: qty 3.33

## 2020-07-02 NOTE — Progress Notes (Signed)
TRH night shift.  The ICU nursing staff reports of the patient has a temperature of 100.7 F.  His chart was reviewed.  He is receiving Zosyn every 8 hours.  He is currently n.p.o. and on TPN.  He underwent an exploratory laparotomy 4 days ago and currently has a JP drain.  He is unable to receive NSAIDs due to renal function.  Acetaminophen 1000 mg IV x1 dose ordered to avoid discomfort or displacement of JP drain while moving the patient.  Tennis Must, MD.

## 2020-07-02 NOTE — Progress Notes (Signed)
PHARMACY - TOTAL PARENTERAL NUTRITION CONSULT NOTE   Indication: gastric perforation  Patient Measurements: Height: 5\' 8"  (172.7 cm) Weight: 70.5 kg (155 lb 6.8 oz) IBW/kg (Calculated) : 68.4 TPN AdjBW (KG): 63.5 Body mass index is 23.63 kg/m. Usual Weight:    Assessment: Per RD note: Inadequate oral intake related to acute illness (gastric ulcer perforation s/p ex-lap with omental patch) as evidenced by NPO status.  Glucose / Insulin: 105-130-->  SSI q4h sensitive scale ordered. 1 unit insulin given Electrolytes:  Na 141   K 3.4    Ca corr: 9.3    Mg: 2.0 Phos 2.3 Renal:  Scr 1.61 > 1.39 LFT:  WNL TG: 129 Prealbumin:  6.9  Albumin: 1.9 Intake / Output:   Intake/Output Summary (Last 24 hours) at 07/02/2020 0857 Last data filed at 07/02/2020 6433 Gross per 24 hour  Intake 1865.34 ml  Output 530 ml  Net 1335.34 ml    MIVF:  D5NS @40ml /hr-->dc at 1700 on 10-29 GI Imaging:  10/29 UGI found continued leak, so TPN to continue Surgeries / Procedures:   POD #3 s/p exp laparotomy w/ omental patch for perforated gastric ulcer  Central access:  CVC Triple Lumen placed 06/28/20 -->rt internal jugular TPN start date:  06-30-20 at 1800  Nutritional Goals (per RD recommendation on 06-30-20): kCal: 2951-8841  Protein: 84-95g  Fluid: >1700 mL Goal TPN rate is 80 mL/hr (provides 92 g of protein and ~1827 kcals per day)  Current Nutrition:  NG dislodged  10-29 Plan is to try to advance diet today to clears   Plan:  Continue TPN at 80 mL/hr at 1800  Electrolytes in TPN: 33mEq/L of Na, 19mEq/L of K, 77mEq/L of Ca, 54mEq/L of Mg, and 20 mmol/L of Phos.  Cl:Ac ratio 1:1 Add standard MVI and trace elements to TPN Adjust sliding scale to sensitive q8h and adjust as needed  Monitor TPN labs on Mon/Thurs   Margot Ables, PharmD Clinical Pharmacist 07/02/2020 9:02 AM

## 2020-07-02 NOTE — Progress Notes (Addendum)
PROGRESS NOTE   Frederick [name]  XVQ:[id number] DOB: Jun 23, 1941 DOA: 06/27/2020 PCP: Patient, No Pcp Per   Chief Complaint  Patient presents with  . Abdominal Pain   Brief Admission History:  79 y.o. male with no documented chronic medical problems presenting with 2-week history of abdominal pain that was intermittent in nature, occasionally worse with food.  The patient has not seen a physician for nearly 5 years.  He does not take any prescription medications.  Apparently, the patient ate some chocolate on 06/27/2020 after which he had unrelenting epigastric abdominal pain.  There was some nausea without any emesis.  The patient denies any NSAIDs.  Because of his abdominal pain, the patient presented for further evaluation.  He had denied any chest pain, shortness breath, diarrhea, dysuria.  In the emergency department, the patient was afebrile with soft blood pressures.  CT of the abdomen and pelvis showed moderate free air in the upper abdomen with a small amount of free fluid consistent with perforated viscus.  It was suspected that this was likely in the prepyloric region of the stomach.  General surgery was consulted.  The patient was taken to the operating room and underwent an exploratory laparotomy with vascular omental patch placement.  Unfortunately, there was difficulty extubating the patient postoperatively.  On the morning of 06/28/2020, the patient began developing hypotension.  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    Assessment & Plan:   Principal Problem:   Gastric perforation, acute Active Problems:   Gastric perforation (HCC)   Acute respiratory failure with hypoxia (HCC)   Hyperkalemia   AKI (acute kidney injury) ([place])   Complete heart block (HCC)   Hypotension   Endotracheally intubated   Perforated abdominal viscus  1. POD#4 s/p Exp laparotomy with omental patch for perforated gastric ulcer - Pt is recovering postop, blood pressures  improved, he is now extubated and off dobutamine.  Consulted to pharm D for TPN infusion started 10/28.  Unfortunately he has bilious output from drain and CT shows a small leak.  He will need to remain NPO.  Continue TPN per surgery team.  NG tube was not able to be placed after multiple attempts.  IV protonix 40 mg every 12 hours.    2. Hypotension - resolved now.  He was briefly on dobutamine infusion which is now discontinued.   3. Hypertension - IV hydralazine ordered.   4. Acute respiratory failure with hypoxia - Pt has now been extubated and on nasal cannula.  PCCM consulted.   5. Hyperkalemia - RESOLVED.  Treated aggressively and resolved now, following.  6. Hypokalemia / Hypophos - IV replacement, recheck in AM.  7. AV block - Appreciate cardiology consultation. Follow for now.  Avoiding beta blockers.  Arrange home cardiac monitor at discharge.   8. Stage 4 CKD - creatinine improved with IV fluids and supportive measures.  Following.  9. Acute delirium - haldol IV prn.  Delirium precautions.    DVT prophylaxis: sQ heparin  Code Status:  Full  Family Communication: friend updated by telephone Disposition: TBD   Status is: Inpatient  Remains inpatient appropriate because:Hemodynamically unstable, Persistent severe electrolyte disturbances, IV treatments appropriate due to intensity of illness or inability to take PO and Inpatient level of care appropriate due to severity of illness  Dispo: The patient is from: Home              Anticipated d/c is to: TBD  Anticipated d/c date is: > 3 days              Patient currently is not medically stable to d/c.  Consultants:   Cardiology  pccm  Surgery   Procedures:   Central line placement 10/26  Exp lap with omental patch 10/26  Antimicrobials:  Zosyn 10/26>>  Subjective: Pt remains confused but reports pain is better controlled today.    Objective: Vitals:   07/02/20 0700 07/02/20 0800 07/02/20 0900 07/02/20  1000  BP: (!) 191/80 (!) 189/99 (!) 177/88 (!) 171/80  Pulse: 87 84 78 68  Resp: (!) 23 (!) 22 (!) 23 (!) 22  Temp: 98.6 F (37 C)     TempSrc:      SpO2: 94% 95% 95% 94%  Weight:      Height:        Intake/Output Summary (Last 24 hours) at 07/02/2020 1224 Last data filed at 07/02/2020 0900 Gross per 24 hour  Intake 2263.53 ml  Output 530 ml  Net 1733.53 ml   Filed Weights   06/27/20 1945 06/29/20 1700 07/02/20 0439  Weight: 63.5 kg 70.1 kg 70.5 kg   Examination:  General exam: elderly chronically ill appearing male, awake, alert, NAD, Appears agitated.   Respiratory system: Clear to auscultation. Respiratory effort normal. Cardiovascular system: normal S1 & S2 heard. No JVD, murmurs, rubs, gallops or clicks. No pedal edema. Gastrointestinal system: Abdomen is nondistended, soft and light incisional tenderness, wound clean and dry and intact. No organomegaly or masses felt. hypoactive BS. Bilious JP drain output.  Central nervous system: Alert and oriented. No focal neurological deficits. Extremities: Symmetric 5 x 5 power. Skin: No rashes, lesions or ulcers Psychiatry: Judgement and insight appear normal. Mood & affect appropriate.   Data Reviewed: I have personally reviewed following labs and imaging studies  CBC: Recent Labs  Lab 06/27/20 2009 06/27/20 2009 06/28/20 0522 06/29/20 0437 06/30/20 0447 07/01/20 0412 07/02/20 0521  WBC 8.7   < > 4.3 12.0* 12.5* 14.0* 12.8*  NEUTROABS 7.7  --  2.5  --  10.7* 12.0* 10.0*  HGB 12.3*   < > 11.6* 9.9* 8.5* 9.1* 8.9*  HCT 38.2*   < > 38.2* 30.9* 26.8* 28.7* 27.0*  MCV 97.2   < > 103.5* 97.2 97.5 97.3 95.7  PLT 392   < > 316 294 253 284 277   < > = values in this interval not displayed.   Basic Metabolic Panel: Recent Labs  Lab 06/28/20 0522 06/28/20 1016 06/29/20 0101 06/29/20 0437 06/30/20 0447 07/01/20 0412 07/02/20 0521  NA 134*   < > 137 137 140 141 141  K 5.2*   < > 4.6 4.4 3.9 3.7 3.4*  CL 104   < > 103  103 105 105 105  CO2 21*   < > _0 GLUCOSE 107*   < > 77 84 107* 132* 124*  BUN 56*   < > 55* 54* 43* 33* 31*  CREATININE 2.52*   < > 2.59* 2.40* 1.82* 1.61* 1.39*  CALCIUM 7.7*   < > 7.8* 7.7* 7.8* 8.1* 8.2*  MG 2.5*  --   --  2.3 2.3 2.2 2.0  PHOS 5.1*  --   --  4.3 3.1 2.4* 2.3*   < > = values in this interval not displayed.   GFR: Estimated Creatinine Clearance: 41 mL/min (A) (by C-G formula based on SCr of 1.39 mg/dL (H)).  Liver Function Tests: Recent Labs  Lab 06/27/20 2009 06/29/20 0437 06/30/20 0447 07/01/20 0412 07/02/20 0521  AST _0 ALT _1 ALKPHOS 47 30* 37* 63 97  BILITOT 1.1 0.7 0.5 0.5 0.8  PROT 7.2 5.2* 4.9* 5.4* 5.2*  ALBUMIN 3.7 2.2* 1.9* 2.0* 1.9*    CBG: Recent Labs  Lab 07/01/20 2053 07/01/20 2355 07/02/20 0403 07/02/20 0743 07/02/20 1128  GLUCAP 119* 118* 130* 114* 127*    Recent Results (from the past 240 hour(s))  Respiratory Panel by RT PCR (Flu A&B, Covid) - Nasopharyngeal Swab     Status: None   Collection Time: 06/27/20  9:48 PM   Specimen: Nasopharyngeal Swab  Result Value Ref Range Status   SARS Coronavirus 2 by RT PCR NEGATIVE NEGATIVE Final    Comment: (NOTE) SARS-CoV-2 target nucleic acids are NOT DETECTED.  The SARS-CoV-2 RNA is generally detectable in upper respiratoy specimens during the acute phase of infection. The lowest concentration of SARS-CoV-2 viral copies this assay can detect is 131 copies/mL. A negative result does not preclude SARS-Cov-2 infection and should not be used as the sole basis for treatment or other patient management decisions. A negative result may occur with  improper specimen collection/handling, submission of specimen other than nasopharyngeal swab, presence of viral mutation(s) within the areas targeted by this assay, and inadequate number of viral copies (<131 copies/mL). A negative result must be combined with clinical observations, patient history, and  epidemiological information. The expected result is Negative.  Fact Sheet for Patients:  PinkCheek.be  Fact Sheet for Healthcare Providers:  GravelBags.it  This test is no t yet approved or cleared by the [place] FDA and  has been authorized for detection and/or diagnosis of SARS-CoV-2 by FDA under an Emergency Use Authorization (EUA). This EUA will remain  in effect (meaning this test can be used) for the duration of the COVID-19 declaration under Section 564(b)(1) of the Act, 21 U.S.C. section 360bbb-3(b)(1), unless the authorization is terminated or revoked sooner.     Influenza A by PCR NEGATIVE NEGATIVE Final   Influenza B by PCR NEGATIVE NEGATIVE Final    Comment: (NOTE) The Xpert Xpress SARS-CoV-2/FLU/RSV assay is intended as an aid in  the diagnosis of influenza from Nasopharyngeal swab specimens and  should not be used as a sole basis for treatment. Nasal washings and  aspirates are unacceptable for Xpert Xpress SARS-CoV-2/FLU/RSV  testing.  Fact Sheet for Patients: PinkCheek.be  Fact Sheet for Healthcare Providers: GravelBags.it  This test is not yet approved or cleared by the [place] FDA and  has been authorized for detection and/or diagnosis of SARS-CoV-2 by  FDA under an Emergency Use Authorization (EUA). This EUA will remain  in effect (meaning this test can be used) for the duration of the  Covid-19 declaration under Section 564(b)(1) of the Act, 21  U.S.C. section 360bbb-3(b)(1), unless the authorization is  terminated or revoked. Performed at [facility], [address], [place], [place] [address]   Aerobic/Anaerobic Culture (surgical/deep wound)     Status: None (Preliminary result)   Collection Time: 06/28/20  2:45 AM   Specimen: PATH Other; Tissue  Result Value Ref Range Status   Specimen Description   Final     PERITONEAL Performed at [facility], [address] [place], [place] [address]    Special Requests   Final    GASTRIC ULCER PREFORATION Performed at [facility], [address] [place], [place] [address]    Gram  Stain   Final    RARE WBC PRESENT, PREDOMINANTLY PMN NO ORGANISMS SEEN    Culture   Final    RARE Multiple Species Consistent With Normal Fecal Flora HOLDING FOR POSSIBLE ANAEROBE Performed at [facility], [place] [address] [place], [place] [address]    Report Status PENDING  Incomplete  Culture, blood (Routine X 2) w Reflex to ID Panel     Status: None (Preliminary result)   Collection Time: 06/28/20  3:21 PM   Specimen: BLOOD  Result Value Ref Range Status   Specimen Description BLOOD LEFT ANTECUBITAL  Final   Special Requests   Final    BOTTLES DRAWN AEROBIC AND ANAEROBIC Blood Culture adequate volume   Culture   Final    NO GROWTH 4 DAYS Performed at [facility], [address], [place], [place] [address]    Report Status PENDING  Incomplete  Culture, blood (Routine X 2) w Reflex to ID Panel     Status: None (Preliminary result)   Collection Time: 06/28/20  3:21 PM   Specimen: BLOOD LEFT HAND  Result Value Ref Range Status   Specimen Description BLOOD LEFT HAND  Final   Special Requests   Final    BOTTLES DRAWN AEROBIC AND ANAEROBIC Blood Culture adequate volume   Culture   Final    NO GROWTH 4 DAYS Performed at [facility], [address], [place], [place] [address]    Report Status PENDING  Incomplete  Culture, Urine     Status: None   Collection Time: 06/28/20  3:27 PM   Specimen: Urine, Clean Catch  Result Value Ref Range Status   Specimen Description   Final    URINE, CLEAN CATCH Performed at [facility], [address] [place], [place] [address]    Special Requests   Final    NONE Performed at [facility], [address] [place], [place] [address]    Culture   Final    NO GROWTH Performed at [facility], [place] [address]  [place], [place] [address]    Report Status 06/30/2020 FINAL  Final     Radiology Studies: CT ABDOMEN WO CONTRAST  Result Date: 07/01/2020 CLINICAL DATA:  Bile within Jackson-Pratt drain post Phillip Heal patch of perforated gastric ulcer EXAM: CT ABDOMEN WITHOUT CONTRAST TECHNIQUE: Multidetector CT imaging of the abdomen was performed following the standard protocol without IV contrast. Sagittal and coronal MPR images reconstructed from axial data set. No oral contrast was administered for this study; contrast is present within the stomach and bowel from preceding water-soluble upper GI. COMPARISON:  Upper GI 07/01/2020, CT abdomen and pelvis 06/27/2020 FINDINGS: Lower chest: Bibasilar effusions and atelectasis Hepatobiliary: Distended gallbladder.  Liver unremarkable. Pancreas: Normal appearance Spleen: Normal appearance Adrenals/Urinary Tract: Multiple BILATERAL renal cysts. Unremarkable adrenal glands. Question tiny vascular calcification versus nonobstructing calculus LEFT renal pelvis. No additional urinary tract calcification or dilatation Stomach/Bowel: Dense contrast within stomach, small bowel loops and colon. Small amount of extravasated contrast is seen adjacent to the tip of the Jackson-Pratt drain which extends along the lesser curve of the stomach consistent with subtle perforation. This is in the general region of the surgical repair. This was not visible on the preceding upper GI exam. Remaining contrast is within the bowel. Duodenal diverticulum noted. Vascular/Lymphatic: Atherosclerotic calcifications aorta, coronary arteries, visceral arteries. No adenopathy Other: Fluid identified in the LEFT upper quadrant, unchanged. Few scattered foci of free air and free fluid consistent with surgery and drain.  No additional focal fluid collection or extravasated contrast. Musculoskeletal: Bones demineralized. IMPRESSION: Small amount of extravasated contrast is seen adjacent to the tip of the Jackson-Pratt  drain which extends to the lesser curve of the stomach consistent with subtle perforation/leak. Fluid in the LEFT upper quadrant laterally with scattered foci of free air and free fluid consistent with prior perforation, surgery and indwelling drain. Bibasilar effusions and atelectasis. Multiple BILATERAL renal cysts. Question tiny vascular calcification versus nonobstructing calculus LEFT renal pelvis. Aortic Atherosclerosis (ICD10-I70.0). Electronically Signed   By: Lavonia Dana M.D.   On: 07/01/2020 14:33   DG Abd 1 View  Result Date: 07/01/2020 CLINICAL DATA:  Bile coming out of Jackson-Pratt drain EXAM: ABDOMEN - 1 VIEW COMPARISON:  None FINDINGS: Contrast opacifies gastric lumen, duodenum small bowel loops throughout abdomen and pelvis. No definite contrast is identified along the surgical drain. Osseous structures demineralized. IMPRESSION: No definite contrast extravasation identified. Electronically Signed   By: Lavonia Dana M.D.   On: 07/01/2020 13:04   DG UGI W SINGLE CM (SOL OR THIN BA)  Result Date: 07/01/2020 CLINICAL DATA:  Post exploratory laparotomy and Phillip Heal patch of a perforated gastric ulcer at anterior wall of lesser curve EXAM: WATER SOLUBLE UPPER GI SERIES TECHNIQUE: Single-column upper GI series was performed using water soluble contrast. CONTRAST:  121m ISOVUE-300 IOPAMIDOL (ISOVUE-300) INJECTION 61% orally COMPARISON:  CT abdomen pelvis 06/27/2020 FLUOROSCOPY TIME:  Fluoroscopy Time:  1 minutes 48 seconds Radiation Exposure Index (if provided by the fluoroscopic device): 32.0 mGy Number of Acquired Spot Images: 2 plus multiple fluoroscopic screen captures FINDINGS: Scout image demonstrates normal bowel gas pattern. Jackson-Pratt drain identified extending from duodenal bulb region along lesser curve. Stomach distends normally. Esophageal dysmotility noted. No gastric outlet obstruction. Mild edema is identified at distal gastric antrum especially at lesser curve. No contrast  extravasation identified. No contrast accumulation along the surgical drain. Duodenum and visualized jejunal loops unremarkable. IMPRESSION: Edema at distal stomach corresponding to site of repair of a perforated gastric ulcer. No contrast extravasation or gastric outlet obstruction identified. Electronically Signed   By: MLavonia DanaM.D.   On: 07/01/2020 11:34   Scheduled Meds: . chlorhexidine gluconate (MEDLINE KIT)  15 mL Mouth Rinse BID  . Chlorhexidine Gluconate Cloth  6 each Topical Daily  . heparin injection (subcutaneous)  5,000 Units Subcutaneous Q8H  . insulin aspart  0-9 Units Subcutaneous Q8H  . ipratropium-albuterol  3 mL Nebulization TID  . mouth rinse  15 mL Mouth Rinse BID  . pantoprazole (PROTONIX) IV  40 mg Intravenous Q12H   Continuous Infusions: . piperacillin-tazobactam (ZOSYN)  IV 3.375 g (07/02/20 0812)  . potassium PHOSPHATE IVPB (in mmol) 10 mmol (07/02/20 1126)  . TPN ADULT (ION) 75 mL/hr at 07/02/20 0704  . TPN ADULT (ION)      LOS: 4 days   Critical Care Procedure Note Authorized and Performed by: CMurvin NatalMD  Total Critical Care time:  33 minutes  Due to a high probability of clinically significant, life threatening deterioration, the patient required my highest level of preparedness to intervene emergently and I personally spent this critical care time directly and personally managing the patient.  This critical care time included obtaining a history; examining the patient, pulse oximetry; ordering and review of studies; arranging urgent treatment with development of a management plan; evaluation of patient's response of treatment; frequent reassessment; and discussions with other providers.  This critical care time was performed to assess and manage the high probability of  imminent and life threatening deterioration that could result in multi-organ failure.  It was exclusive of separately billable procedures and treating other patients and teaching time.    Irwin Brakeman, MD How to contact the [facility] Attending or Consulting provider [place] or covering provider during after hours [place], for this patient?  1. Check the care team in [facility] and look for a) attending/consulting [facility] provider listed and b) the [facility] team listed 2. Log into [email or web address] and use [place]'s universal password to access. If you do not have the password, please contact the hospital operator. 3. Locate the [facility] provider you are looking for under [facility] and page to a number that you can be directly reached. 4. If you still have difficulty reaching the provider, please page the [facility] (Director on Call) for the Hospitalists listed on amion for assistance.  07/02/2020, 12:24 PM

## 2020-07-02 NOTE — Progress Notes (Signed)
[place] Hospital Day(s): 4.   Post op day(s): 4 Days Post-Op.   Interval History: Patient seen and examined, no acute events or new complaints overnight. Patient reports wanting to get advance directives worked out today with his neighbor-friend at bedside. Also wants to establish contacts for giving out information.     Vital signs in last 24 hours: [min-max] current  Temp:  [97.7 F (36.5 C)-100.7 F (38.2 C)] 98.6 F (37 C) (10/30 0700) Pulse Rate:  [55-104] 78 (10/30 0900) Resp:  [11-27] 23 (10/30 0900) BP: (110-228)/(60-178) 177/88 (10/30 0900) SpO2:  [72 %-100 %] 95 % (10/30 0900) Weight:  [70.5 kg] 70.5 kg (10/30 0439)     Height: 5\' 8"  (172.7 cm) Weight: 70.5 kg BMI (Calculated): 23.64   Intake/Output last 2 shifts:  10/29 0701 - 10/30 0700 In: 954.1 [I.V.:911.3; IV Piggyback:42.7] Out: 530 [Urine:500; Drains:30]   Physical Exam:  Constitutional: alert, cooperative and no distress  Respiratory: breathing non-labored at rest  Cardiovascular: regular rate and sinus rhythm  Gastrointestinal: soft, non-tender, and non-distended, JP with minimal bile tinged serous fluid in bulb.   Integumentary: Dressing intact, no erythema no discharge.   Labs:  CBC Latest Ref Rng & Units 07/02/2020 07/01/2020 06/30/2020  WBC 4.0 - 10.5 K/uL 12.8(H) 14.0(H) 12.5(H)  Hemoglobin 13.0 - 17.0 g/dL 8.9(L) 9.1(L) 8.5(L)  Hematocrit 39 - 52 % 27.0(L) 28.7(L) 26.8(L)  Platelets 150 - 400 K/uL 277 284 253   CMP Latest Ref Rng & Units 07/02/2020 07/01/2020 06/30/2020  Glucose 70 - 99 mg/dL 124(H) 132(H) 107(H)  BUN 8 - 23 mg/dL 31(H) 33(H) 43(H)  Creatinine 0.61 - 1.24 mg/dL 1.39(H) 1.61(H) 1.82(H)  Sodium 135 - 145 mmol/L 141 141 140  Potassium 3.5 - 5.1 mmol/L 3.4(L) 3.7 3.9  Chloride 98 - 111 mmol/L 105 105 105  CO2 22 - 32 mmol/L 27 27 26   Calcium 8.9 - 10.3 mg/dL 8.2(L) 8.1(L) 7.8(L)  Total Protein 6.5 - 8.1 g/dL 5.2(L) 5.4(L) 4.9(L)  Total  Bilirubin 0.3 - 1.2 mg/dL 0.8 0.5 0.5  Alkaline Phos 38 - 126 U/L 97 63 37(L)  AST 15 - 41 U/L 22 21 21   ALT 0 - 44 U/L 16 17 17    Radiology review:  CLINICAL DATA:  Bile within Jackson-Pratt drain post Phillip Heal patch of perforated gastric ulcer   EXAM: CT ABDOMEN WITHOUT CONTRAST   TECHNIQUE: Multidetector CT imaging of the abdomen was performed following the standard protocol without IV contrast. Sagittal and coronal MPR images reconstructed from axial data set. No oral contrast was administered for this study; contrast is present within the stomach and bowel from preceding water-soluble upper GI.   COMPARISON:  Upper GI 07/01/2020, CT abdomen and pelvis 06/27/2020   FINDINGS: Lower chest: Bibasilar effusions and atelectasis   Hepatobiliary: Distended gallbladder.  Liver unremarkable.   Pancreas: Normal appearance   Spleen: Normal appearance   Adrenals/Urinary Tract: Multiple BILATERAL renal cysts. Unremarkable adrenal glands. Question tiny vascular calcification versus nonobstructing calculus LEFT renal pelvis. No additional urinary tract calcification or dilatation   Stomach/Bowel: Dense contrast within stomach, small bowel loops and colon. Small amount of extravasated contrast is seen adjacent to the tip of the Jackson-Pratt drain which extends along the lesser curve of the stomach consistent with subtle perforation. This is in the general region of the surgical repair. This was not visible on the preceding upper GI exam. Remaining contrast is within the bowel. Duodenal diverticulum noted.   Vascular/Lymphatic: Atherosclerotic  calcifications aorta, coronary arteries, visceral arteries. No adenopathy   Other: Fluid identified in the LEFT upper quadrant, unchanged. Few scattered foci of free air and free fluid consistent with surgery and drain. No additional focal fluid collection or extravasated contrast.   Musculoskeletal: Bones demineralized.    IMPRESSION: Small amount of extravasated contrast is seen adjacent to the tip of the Jackson-Pratt drain which extends to the lesser curve of the stomach consistent with subtle perforation/leak.   Fluid in the LEFT upper quadrant laterally with scattered foci of free air and free fluid consistent with prior perforation, surgery and indwelling drain.   Bibasilar effusions and atelectasis.   Multiple BILATERAL renal cysts.   Question tiny vascular calcification versus nonobstructing calculus LEFT renal pelvis.   Aortic Atherosclerosis (ICD10-I70.0).     Electronically Signed   By: Lavonia Dana M.D.   On: 07/01/2020 14:33  Imaging studies: No new pertinent imaging studies   Assessment/Plan:  79 y.o. male with perforated GU 4 Days Post-Op s/p graham patch, complicated by pertinent comorbidities.  Currently with controlled small leak as noted on CT.  Patient Active Problem List   Diagnosis Date Noted  . Endotracheally intubated   . Perforated abdominal viscus   . Gastric perforation ([place]) 06/28/2020  . Acute respiratory failure with hypoxia ([place]) 06/28/2020  . Hyperkalemia 06/28/2020  . Gastric perforation, acute   . AKI (acute kidney injury) ([place])   . Complete heart block ([place])   . Hypotension     -Continued DVT prophylaxis.  -Continue PPI and TPN.  Patient to remain n.p.o.  -Continue to monitor drain output.  -Continue IV Zosyn.  All of the above findings and recommendations were discussed with the patient, patient's friend, and the medical team, and all of patient's questions were answered to his expressed satisfaction.  -- Ronny Bacon, M.D., [facility] 07/02/2020

## 2020-07-03 LAB — CBC WITH DIFFERENTIAL/PLATELET
Abs Immature Granulocytes: 0.68 10*3/uL — ABNORMAL HIGH (ref 0.00–0.07)
Basophils Absolute: 0.1 10*3/uL (ref 0.0–0.1)
Basophils Relative: 0 %
Eosinophils Absolute: 0.3 10*3/uL (ref 0.0–0.5)
Eosinophils Relative: 2 %
HCT: 28.5 % — ABNORMAL LOW (ref 39.0–52.0)
Hemoglobin: 9.3 g/dL — ABNORMAL LOW (ref 13.0–17.0)
Immature Granulocytes: 5 %
Lymphocytes Relative: 14 %
Lymphs Abs: 2.1 10*3/uL (ref 0.7–4.0)
MCH: 30.8 pg (ref 26.0–34.0)
MCHC: 32.6 g/dL (ref 30.0–36.0)
MCV: 94.4 fL (ref 80.0–100.0)
Monocytes Absolute: 1.1 10*3/uL — ABNORMAL HIGH (ref 0.1–1.0)
Monocytes Relative: 8 %
Neutro Abs: 10.6 10*3/uL — ABNORMAL HIGH (ref 1.7–7.7)
Neutrophils Relative %: 71 %
Platelets: 323 10*3/uL (ref 150–400)
RBC: 3.02 MIL/uL — ABNORMAL LOW (ref 4.22–5.81)
RDW: 15.1 % (ref 11.5–15.5)
WBC: 14.8 10*3/uL — ABNORMAL HIGH (ref 4.0–10.5)
nRBC: 0 % (ref 0.0–0.2)

## 2020-07-03 LAB — CULTURE, BLOOD (ROUTINE X 2)
Culture: NO GROWTH
Culture: NO GROWTH
Special Requests: ADEQUATE
Special Requests: ADEQUATE

## 2020-07-03 LAB — AEROBIC/ANAEROBIC CULTURE W GRAM STAIN (SURGICAL/DEEP WOUND): Culture: NORMAL

## 2020-07-03 LAB — PHOSPHORUS: Phosphorus: 3.4 mg/dL (ref 2.5–4.6)

## 2020-07-03 LAB — COMPREHENSIVE METABOLIC PANEL
ALT: 17 U/L (ref 0–44)
AST: 20 U/L (ref 15–41)
Albumin: 2 g/dL — ABNORMAL LOW (ref 3.5–5.0)
Alkaline Phosphatase: 95 U/L (ref 38–126)
Anion gap: 9 (ref 5–15)
BUN: 33 mg/dL — ABNORMAL HIGH (ref 8–23)
CO2: 26 mmol/L (ref 22–32)
Calcium: 8.3 mg/dL — ABNORMAL LOW (ref 8.9–10.3)
Chloride: 102 mmol/L (ref 98–111)
Creatinine, Ser: 1.21 mg/dL (ref 0.61–1.24)
GFR, Estimated: >60 mL/min (ref >60)
Glucose, Bld: 105 mg/dL — ABNORMAL HIGH (ref 70–99)
Potassium: 3.4 mmol/L — ABNORMAL LOW (ref 3.5–5.1)
Sodium: 137 mmol/L (ref 135–145)
Total Bilirubin: 0.6 mg/dL (ref 0.3–1.2)
Total Protein: 5.5 g/dL — ABNORMAL LOW (ref 6.5–8.1)

## 2020-07-03 LAB — GLUCOSE, CAPILLARY
Glucose-Capillary: 123 mg/dL — ABNORMAL HIGH (ref 70–99)
Glucose-Capillary: 132 mg/dL — ABNORMAL HIGH (ref 70–99)
Glucose-Capillary: 122 mg/dL — ABNORMAL HIGH (ref 70–99)
Glucose-Capillary: 123 mg/dL — ABNORMAL HIGH (ref 70–99)
Glucose-Capillary: 110 mg/dL — ABNORMAL HIGH (ref 70–99)

## 2020-07-03 MED ORDER — TRAVASOL 10 % IV SOLN
INTRAVENOUS | Status: AC
  Filled 2020-07-03: qty 921.6

## 2020-07-03 NOTE — Progress Notes (Signed)
[facility] Day(s): 5.   Post op day(s): 5 Days Post-Op.   Interval History: Patient seen and examined, no acute events or new complaints overnight.  Awaken from sleep, no complaints.  Had a good bowel movement today.  Vital signs in last 24 hours: [min-max] current  Temp:  [98.4 F (36.9 C)-99.4 F (37.4 C)] 98.7 F (37.1 C) (10/31 0808) Pulse Rate:  [58-95] 58 (10/31 0900) Resp:  [14-27] 14 (10/31 0900) BP: (114-186)/(47-101) 130/54 (10/31 0900) SpO2:  [94 %-98 %] 94 % (10/31 0900) Weight:  [66.4 kg] 66.4 kg (10/31 0400)     Height: 5\' 8"  (172.7 cm) Weight: 66.4 kg BMI (Calculated): 22.26   Intake/Output last 2 shifts:  10/30 0701 - 10/31 0700 In: 3882.4 [P.O.:720; I.V.:2584.8; IV Piggyback:577.6] Out: 2855 [Urine:2850; Drains:5]   Physical Exam:  Constitutional: alert, cooperative and no distress  Respiratory: breathing non-labored at rest  Cardiovascular: regular rate and sinus rhythm  Gastrointestinal: soft, non-tender, and non-distended, JP with minimal bile tinged serous fluid in bulb.   Integumentary: Dressing intact, no erythema no discharge.   Labs:  CBC Latest Ref Rng & Units 07/03/2020 07/02/2020 07/01/2020  WBC 4.0 - 10.5 K/uL 14.8(H) 12.8(H) 14.0(H)  Hemoglobin 13.0 - 17.0 g/dL 9.3(L) 8.9(L) 9.1(L)  Hematocrit 39 - 52 % 28.5(L) 27.0(L) 28.7(L)  Platelets 150 - 400 K/uL 323 277 284   CMP Latest Ref Rng & Units 07/03/2020 07/02/2020 07/01/2020  Glucose 70 - 99 mg/dL 105(H) 124(H) 132(H)  BUN 8 - 23 mg/dL 33(H) 31(H) 33(H)  Creatinine 0.61 - 1.24 mg/dL 1.21 1.39(H) 1.61(H)  Sodium 135 - 145 mmol/L 137 141 141  Potassium 3.5 - 5.1 mmol/L 3.4(L) 3.4(L) 3.7  Chloride 98 - 111 mmol/L 102 105 105  CO2 22 - 32 mmol/L 26 27 27   Calcium 8.9 - 10.3 mg/dL 8.3(L) 8.2(L) 8.1(L)  Total Protein 6.5 - 8.1 g/dL 5.5(L) 5.2(L) 5.4(L)  Total Bilirubin 0.3 - 1.2 mg/dL 0.6 0.8 0.5  Alkaline Phos 38 - 126 U/L 95 97 63  AST 15 -  41 U/L 20 22 21   ALT 0 - 44 U/L 17 16 17    Radiology review:  CLINICAL DATA:  Bile within Jackson-Pratt drain post Phillip Heal patch of perforated gastric ulcer   EXAM: CT ABDOMEN WITHOUT CONTRAST   TECHNIQUE: Multidetector CT imaging of the abdomen was performed following the standard protocol without IV contrast. Sagittal and coronal MPR images reconstructed from axial data set. No oral contrast was administered for this study; contrast is present within the stomach and bowel from preceding water-soluble upper GI.   COMPARISON:  Upper GI 07/01/2020, CT abdomen and pelvis 06/27/2020   FINDINGS: Lower chest: Bibasilar effusions and atelectasis   Hepatobiliary: Distended gallbladder.  Liver unremarkable.   Pancreas: Normal appearance   Spleen: Normal appearance   Adrenals/Urinary Tract: Multiple BILATERAL renal cysts. Unremarkable adrenal glands. Question tiny vascular calcification versus nonobstructing calculus LEFT renal pelvis. No additional urinary tract calcification or dilatation   Stomach/Bowel: Dense contrast within stomach, small bowel loops and colon. Small amount of extravasated contrast is seen adjacent to the tip of the Jackson-Pratt drain which extends along the lesser curve of the stomach consistent with subtle perforation. This is in the general region of the surgical repair. This was not visible on the preceding upper GI exam. Remaining contrast is within the bowel. Duodenal diverticulum noted.   Vascular/Lymphatic: Atherosclerotic calcifications aorta, coronary arteries, visceral arteries. No adenopathy   Other: Fluid identified  in the LEFT upper quadrant, unchanged. Few scattered foci of free air and free fluid consistent with surgery and drain. No additional focal fluid collection or extravasated contrast.   Musculoskeletal: Bones demineralized.   IMPRESSION: Small amount of extravasated contrast is seen adjacent to the tip of the Jackson-Pratt  drain which extends to the lesser curve of the stomach consistent with subtle perforation/leak.   Fluid in the LEFT upper quadrant laterally with scattered foci of free air and free fluid consistent with prior perforation, surgery and indwelling drain.   Bibasilar effusions and atelectasis.   Multiple BILATERAL renal cysts.   Question tiny vascular calcification versus nonobstructing calculus LEFT renal pelvis.   Aortic Atherosclerosis (ICD10-I70.0).     Electronically Signed   By: Lavonia Dana M.D.   On: 07/01/2020 14:33  Imaging studies: No new pertinent imaging studies   Assessment/Plan:  79 y.o. male with perforated GU 5 Days Post-Op s/p graham patch, complicated by pertinent comorbidities.  Currently with controlled small leak as noted on CT. WBC slightly elevated. But appears to be cognitively and hemodynamically stable.  Patient Active Problem List   Diagnosis Date Noted  . Endotracheally intubated   . Perforated abdominal viscus   . Gastric perforation ([place]) 06/28/2020  . Acute respiratory failure with hypoxia ([place]) 06/28/2020  . Hyperkalemia 06/28/2020  . Gastric perforation, acute   . AKI (acute kidney injury) ([place])   . Complete heart block ([place])   . Hypotension     -Continued DVT prophylaxis.  -Continue PPI and TPN.  Patient to remain n.p.o.  -Continue to monitor drain output.  -Continue IV Zosyn.  All of the above findings and recommendations were discussed with the patient, patient's friend, and the medical team, and all of patient's questions were answered to his expressed satisfaction.  -- Ronny Bacon, M.D., [facility] 07/03/2020

## 2020-07-03 NOTE — Progress Notes (Signed)
PHARMACY - TOTAL PARENTERAL NUTRITION CONSULT NOTE   Indication: gastric perforation  Patient Measurements: Height: 5\' 8"  (172.7 cm) Weight: 66.4 kg (146 lb 6.2 oz) IBW/kg (Calculated) : 68.4 TPN AdjBW (KG): 63.5 Body mass index is 22.26 kg/m. Usual Weight:    Assessment: Per RD note: Inadequate oral intake related to acute illness (gastric ulcer perforation s/p ex-lap with omental patch) as evidenced by NPO status.  Glucose / Insulin: 123-130-->  SSI q8h sensitive scale ordered. 2 unit insulin given Electrolytes:  Na 141   K 3.4    Ca corr: 9.3    Mg: 2.0 Phos 2.3 Renal:  Scr 1.61 > 1.39 LFT:  WNL TG: 129 Prealbumin:  6.9  Albumin: 1.9 Intake / Output:   Intake/Output Summary (Last 24 hours) at 07/03/2020 0846 Last data filed at 07/03/2020 0501 Gross per 24 hour  Intake 2717.64 ml  Output 2855 ml  Net -137.36 ml    MIVF:  D5NS @40ml /hr-->dc at 1700 on 10-29 GI Imaging:  10/29 UGI found continued leak, so TPN to continue Surgeries / Procedures:   POD #3 s/p exp laparotomy w/ omental patch for perforated gastric ulcer  Central access:  CVC Triple Lumen placed 06/28/20 -->rt internal jugular TPN start date:  06-30-20 at 1800  Nutritional Goals (per RD recommendation on 06-30-20): kCal: 1856-3149  Protein: 84-95g  Fluid: >1700 mL Goal TPN rate is 80 mL/hr (provides 92 g of protein and ~1827 kcals per day)  Current Nutrition:  NG dislodged  10-29 Plan is to try to advance diet today to clears   Plan:  Continue TPN at 80 mL/hr at 1800  Electrolytes in TPN: 22mEq/L of Na, 10mEq/L of K, 65mEq/L of Ca, 60mEq/L of Mg, and 20 mmol/L of Phos.  Cl:Ac ratio 1:1 Add standard MVI and trace elements to TPN Adjust sliding scale to sensitive q8h and adjust as needed  Monitor TPN labs on Mon/Thurs   Margot Ables, PharmD Clinical Pharmacist 07/03/2020 8:46 AM

## 2020-07-03 NOTE — Progress Notes (Signed)
PROGRESS NOTE   Frederick [name]  SPQ:[id number] DOB: 09-24-1940 DOA: 06/27/2020 PCP: Patient, No Pcp Per   Chief Complaint  Patient presents with  . Abdominal Pain   Brief Admission History:  79 y.o. male with no documented chronic medical problems presenting with 2-week history of abdominal pain that was intermittent in nature, occasionally worse with food.  The patient has not seen a physician for nearly 5 years.  He does not take any prescription medications.  Apparently, the patient ate some chocolate on 06/27/2020 after which he had unrelenting epigastric abdominal pain.  There was some nausea without any emesis.  The patient denies any NSAIDs.  Because of his abdominal pain, the patient presented for further evaluation.  He had denied any chest pain, shortness breath, diarrhea, dysuria.  In the emergency department, the patient was afebrile with soft blood pressures.  CT of the abdomen and pelvis showed moderate free air in the upper abdomen with a small amount of free fluid consistent with perforated viscus.  It was suspected that this was likely in the prepyloric region of the stomach.  General surgery was consulted.  The patient was taken to the operating room and underwent an exploratory laparotomy with vascular omental patch placement.  Unfortunately, there was difficulty extubating the patient postoperatively.  On the morning of 06/28/2020, the patient began developing hypotension.  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    Assessment & Plan:   Principal Problem:   Gastric perforation, acute Active Problems:   Gastric perforation (HCC)   Acute respiratory failure with hypoxia (HCC)   Hyperkalemia   AKI (acute kidney injury) ([place])   Complete heart block (HCC)   Hypotension   Endotracheally intubated   Perforated abdominal viscus  1. POD#5 s/p Exp laparotomy with omental patch for perforated gastric ulcer - Pt is recovering postop, blood pressures  improved, he is now extubated and off dobutamine.  Consulted to pharm D for TPN infusion started 10/28.  Unfortunately he has bilious output from drain and CT shows a small leak.  He will need to remain NPO.  Continue TPN per surgery team.  NG tube was not able to be placed after multiple attempts.  IV protonix 40 mg every 12 hours.    2. Hypotension - resolved now.  He was briefly on dobutamine infusion which is now discontinued.   3. Hypertension - IV hydralazine ordered.   4. Acute respiratory failure with hypoxia - Pt has now been extubated and on nasal cannula.  PCCM consulted.   5. Hyperkalemia - RESOLVED.  Treated aggressively and resolved now, following.  6. Hypokalemia / Hypophos - IV replacement, recheck in AM.  7. AV block - Appreciate cardiology consultation. Follow for now.  Avoiding beta blockers.  Arrange home cardiac monitor at discharge.   8. Stage 4 CKD - creatinine improved with IV fluids and supportive measures.  Following.  9. Acute delirium - haldol IV prn.  Delirium precautions.    DVT prophylaxis: sQ heparin  Code Status:  [name]  Family Communication: friend updated by telephone Disposition: TBD   Status is: Inpatient  Remains inpatient appropriate because:Hemodynamically unstable, Persistent severe electrolyte disturbances, IV treatments appropriate due to intensity of illness or inability to take PO and Inpatient level of care appropriate due to severity of illness  Dispo: The patient is from: Home              Anticipated d/c is to: TBD  Anticipated d/c date is: > 3 days              Patient currently is not medically stable to d/c.  Consultants:   Cardiology  pccm  Surgery   Procedures:   Central line placement 10/26  Exp lap with omental patch 10/26  Antimicrobials:  Zosyn 10/26>>  Subjective: Pt remains confused but reports pain is better controlled today.    Objective: Vitals:   07/03/20 0700 07/03/20 0800 07/03/20 0808 07/03/20  0900  BP: 139/69 (!) 149/70  (!) 130/54  Pulse: 80 72  (!) 58  Resp: 16 16  14   Temp:   98.7 F (37.1 C)   TempSrc:   Oral   SpO2: 95% 94%  94%  Weight:      Height:        Intake/Output Summary (Last 24 hours) at 07/03/2020 1002 Last data filed at 07/03/2020 0900 Gross per 24 hour  Intake 2622.56 ml  Output 2855 ml  Net -232.44 ml   Filed Weights   06/29/20 1700 07/02/20 0439 07/03/20 0400  Weight: 70.1 kg 70.5 kg 66.4 kg   Examination:  General exam: elderly chronically ill appearing male, awake, alert, NAD, less agitated today.   Respiratory system: Clear to auscultation. Respiratory effort normal. No crackles or wheezes heard.  Cardiovascular system: normal S1 & S2 heard. No JVD, murmurs, rubs, gallops or clicks. No pedal edema. Gastrointestinal system: Abdomen is nondistended, soft and light incisional tenderness, wound clean and dry and intact. No organomegaly or masses felt. hypoactive BS. JP drain in place.   Central nervous system: Alert and oriented to person only. No focal neurological deficits. Extremities: Symmetric 5 x 5 power. Skin: No rashes, lesions or ulcers Psychiatry: intermittently agitated.   Data Reviewed: I have personally reviewed following labs and imaging studies  CBC: Recent Labs  Lab 06/28/20 0522 06/28/20 0522 06/29/20 0437 06/30/20 0447 07/01/20 0412 07/02/20 0521 07/03/20 0523  WBC 4.3   < > 12.0* 12.5* 14.0* 12.8* 14.8*  NEUTROABS 2.5  --   --  10.7* 12.0* 10.0* 10.6*  HGB 11.6*   < > 9.9* 8.5* 9.1* 8.9* 9.3*  HCT 38.2*   < > 30.9* 26.8* 28.7* 27.0* 28.5*  MCV 103.5*   < > 97.2 97.5 97.3 95.7 94.4  PLT 316   < > 294 253 284 277 323   < > = values in this interval not displayed.   Basic Metabolic Panel: Recent Labs  Lab 06/28/20 0522 06/28/20 1016 06/29/20 0437 06/30/20 0447 07/01/20 0412 07/02/20 0521 07/03/20 0523  NA 134*   < > 137 140 141 141 137  K 5.2*   < > 4.4 3.9 3.7 3.4* 3.4*  CL 104   < > 103 105 105 105 102   CO2 21*   < > 26 26 27 27 26   GLUCOSE 107*   < > 84 107* 132* 124* 105*  BUN 56*   < > 54* 43* 33* 31* 33*  CREATININE 2.52*   < > 2.40* 1.82* 1.61* 1.39* 1.21  CALCIUM 7.7*   < > 7.7* 7.8* 8.1* 8.2* 8.3*  MG 2.5*  --  2.3 2.3 2.2 2.0  --   PHOS 5.1*  --  4.3 3.1 2.4* 2.3* 3.4   < > = values in this interval not displayed.   GFR: Estimated Creatinine Clearance: 45.7 mL/min (by C-G formula based on SCr of 1.21 mg/dL).  Liver Function Tests: Recent Labs  Lab 06/29/20 0437 06/30/20  1173 07/01/20 0412 07/02/20 0521 07/03/20 0523  AST 27 21 21 22 20   ALT 21 17 17 16 17   ALKPHOS 30* 37* 63 97 95  BILITOT 0.7 0.5 0.5 0.8 0.6  PROT 5.2* 4.9* 5.4* 5.2* 5.5*  ALBUMIN 2.2* 1.9* 2.0* 1.9* 2.0*    CBG: Recent Labs  Lab 07/02/20 1128 07/02/20 1630 07/02/20 1958 07/03/20 0444 07/03/20 0743  GLUCAP 127* 137* 124* 123* 132*    Recent Results (from the past 240 hour(s))  Respiratory Panel by RT PCR (Flu A&B, Covid) - Nasopharyngeal Swab     Status: None   Collection Time: 06/27/20  9:48 PM   Specimen: Nasopharyngeal Swab  Result Value Ref Range Status   SARS Coronavirus 2 by RT PCR NEGATIVE NEGATIVE Final    Comment: (NOTE) SARS-CoV-2 target nucleic acids are NOT DETECTED.  The SARS-CoV-2 RNA is generally detectable in upper respiratoy specimens during the acute phase of infection. The lowest concentration of SARS-CoV-2 viral copies this assay can detect is 131 copies/mL. A negative result does not preclude SARS-Cov-2 infection and should not be used as the sole basis for treatment or other patient management decisions. A negative result may occur with  improper specimen collection/handling, submission of specimen other than nasopharyngeal swab, presence of viral mutation(s) within the areas targeted by this assay, and inadequate number of viral copies (<131 copies/mL). A negative result must be combined with clinical observations, patient history, and epidemiological  information. The expected result is Negative.  Fact Sheet for Patients:  PinkCheek.be  Fact Sheet for Healthcare Providers:  GravelBags.it  This test is no t yet approved or cleared by the [place] FDA and  has been authorized for detection and/or diagnosis of SARS-CoV-2 by FDA under an Emergency Use Authorization (EUA). This EUA will remain  in effect (meaning this test can be used) for the duration of the COVID-19 declaration under Section 564(b)(1) of the Act, 21 U.S.C. section 360bbb-3(b)(1), unless the authorization is terminated or revoked sooner.     Influenza A by PCR NEGATIVE NEGATIVE Final   Influenza B by PCR NEGATIVE NEGATIVE Final    Comment: (NOTE) The Xpert Xpress SARS-CoV-2/FLU/RSV assay is intended as an aid in  the diagnosis of influenza from Nasopharyngeal swab specimens and  should not be used as a sole basis for treatment. Nasal washings and  aspirates are unacceptable for Xpert Xpress SARS-CoV-2/FLU/RSV  testing.  Fact Sheet for Patients: PinkCheek.be  Fact Sheet for Healthcare Providers: GravelBags.it  This test is not yet approved or cleared by the [place] FDA and  has been authorized for detection and/or diagnosis of SARS-CoV-2 by  FDA under an Emergency Use Authorization (EUA). This EUA will remain  in effect (meaning this test can be used) for the duration of the  Covid-19 declaration under Section 564(b)(1) of the Act, 21  U.S.C. section 360bbb-3(b)(1), unless the authorization is  terminated or revoked. Performed at [facility], [address], [place], [place] [address]   Aerobic/Anaerobic Culture (surgical/deep wound)     Status: None (Preliminary result)   Collection Time: 06/28/20  2:45 AM   Specimen: PATH Other; Tissue  Result Value Ref Range Status   Specimen Description   Final    PERITONEAL Performed at [facility], [address], [place], [place] [address]    Special Requests   Final    GASTRIC ULCER PREFORATION Performed at [facility], [address] [place], [place] [address]    Gram Stain   Final  RARE WBC PRESENT, PREDOMINANTLY PMN NO ORGANISMS SEEN Performed at [facility] [address], [place], [place] [address]    Culture   Final    RARE Multiple Species Consistent With Normal Fecal Flora RARE CANDIDA GLABRATA NO ANAEROBES ISOLATED; CULTURE IN PROGRESS FOR 5 DAYS    Report Status PENDING  Incomplete  Culture, blood (Routine X 2) w Reflex to ID Panel     Status: None (Preliminary result)   Collection Time: 06/28/20  3:21 PM   Specimen: BLOOD  Result Value Ref Range Status   Specimen Description BLOOD LEFT ANTECUBITAL  Final   Special Requests   Final    BOTTLES DRAWN AEROBIC AND ANAEROBIC Blood Culture adequate volume   Culture   Final    NO GROWTH 4 DAYS Performed at [facility], [address], [place], [place] [address]    Report Status PENDING  Incomplete  Culture, blood (Routine X 2) w Reflex to ID Panel     Status: None (Preliminary result)   Collection Time: 06/28/20  3:21 PM   Specimen: BLOOD LEFT HAND  Result Value Ref Range Status   Specimen Description BLOOD LEFT HAND  Final   Special Requests   Final    BOTTLES DRAWN AEROBIC AND ANAEROBIC Blood Culture adequate volume   Culture   Final    NO GROWTH 4 DAYS Performed at [facility], [address], [place], [place] [address]    Report Status PENDING  Incomplete  Culture, Urine     Status: None   Collection Time: 06/28/20  3:27 PM   Specimen: Urine, Clean Catch  Result Value Ref Range Status   Specimen Description   Final    URINE, CLEAN CATCH Performed at [facility], [address], [place], [place] [address]    Special Requests   Final    NONE Performed at [facility], [address] [place], [place] [address]    Culture   Final    NO GROWTH Performed at [facility],  [place] [address] [place],  [address]    Report Status 06/30/2020 FINAL  Final     Radiology Studies: CT ABDOMEN WO CONTRAST  Result Date: 07/01/2020 CLINICAL DATA:  Bile within Jackson-Pratt drain post Phillip Heal patch of perforated gastric ulcer EXAM: CT ABDOMEN WITHOUT CONTRAST TECHNIQUE: Multidetector CT imaging of the abdomen was performed following the standard protocol without IV contrast. Sagittal and coronal MPR images reconstructed from axial data set. No oral contrast was administered for this study; contrast is present within the stomach and bowel from preceding water-soluble upper GI. COMPARISON:  Upper GI 07/01/2020, CT abdomen and pelvis 06/27/2020 FINDINGS: Lower chest: Bibasilar effusions and atelectasis Hepatobiliary: Distended gallbladder.  Liver unremarkable. Pancreas: Normal appearance Spleen: Normal appearance Adrenals/Urinary Tract: Multiple BILATERAL renal cysts. Unremarkable adrenal glands. Question tiny vascular calcification versus nonobstructing calculus LEFT renal pelvis. No additional urinary tract calcification or dilatation Stomach/Bowel: Dense contrast within stomach, small bowel loops and colon. Small amount of extravasated contrast is seen adjacent to the tip of the Jackson-Pratt drain which extends along the lesser curve of the stomach consistent with subtle perforation. This is in the general region of the surgical repair. This was not visible on the preceding upper GI exam. Remaining contrast is within the bowel. Duodenal diverticulum noted. Vascular/Lymphatic: Atherosclerotic calcifications aorta, coronary arteries, visceral arteries. No adenopathy Other: Fluid identified in the LEFT upper quadrant, unchanged. Few scattered foci of free air and free fluid consistent with surgery and  drain. No additional focal fluid collection or extravasated contrast. Musculoskeletal: Bones demineralized. IMPRESSION: Small amount of extravasated contrast is seen adjacent to the tip of the  Jackson-Pratt drain which extends to the lesser curve of the stomach consistent with subtle perforation/leak. Fluid in the LEFT upper quadrant laterally with scattered foci of free air and free fluid consistent with prior perforation, surgery and indwelling drain. Bibasilar effusions and atelectasis. Multiple BILATERAL renal cysts. Question tiny vascular calcification versus nonobstructing calculus LEFT renal pelvis. Aortic Atherosclerosis (ICD10-I70.0). Electronically Signed   By: Lavonia Dana M.D.   On: 07/01/2020 14:33   DG Abd 1 View  Result Date: 07/01/2020 CLINICAL DATA:  Bile coming out of Jackson-Pratt drain EXAM: ABDOMEN - 1 VIEW COMPARISON:  None FINDINGS: Contrast opacifies gastric lumen, duodenum small bowel loops throughout abdomen and pelvis. No definite contrast is identified along the surgical drain. Osseous structures demineralized. IMPRESSION: No definite contrast extravasation identified. Electronically Signed   By: Lavonia Dana M.D.   On: 07/01/2020 13:04   DG UGI W SINGLE CM (SOL OR THIN BA)  Result Date: 07/01/2020 CLINICAL DATA:  Post exploratory laparotomy and Phillip Heal patch of a perforated gastric ulcer at anterior wall of lesser curve EXAM: WATER SOLUBLE UPPER GI SERIES TECHNIQUE: Single-column upper GI series was performed using water soluble contrast. CONTRAST:  123m ISOVUE-300 IOPAMIDOL (ISOVUE-300) INJECTION 61% orally COMPARISON:  CT abdomen pelvis 06/27/2020 FLUOROSCOPY TIME:  Fluoroscopy Time:  1 minutes 48 seconds Radiation Exposure Index (if provided by the fluoroscopic device): 32.0 mGy Number of Acquired Spot Images: 2 plus multiple fluoroscopic screen captures FINDINGS: Scout image demonstrates normal bowel gas pattern. Jackson-Pratt drain identified extending from duodenal bulb region along lesser curve. Stomach distends normally. Esophageal dysmotility noted. No gastric outlet obstruction. Mild edema is identified at distal gastric antrum especially at lesser curve. No  contrast extravasation identified. No contrast accumulation along the surgical drain. Duodenum and visualized jejunal loops unremarkable. IMPRESSION: Edema at distal stomach corresponding to site of repair of a perforated gastric ulcer. No contrast extravasation or gastric outlet obstruction identified. Electronically Signed   By: MLavonia DanaM.D.   On: 07/01/2020 11:34   Scheduled Meds: . chlorhexidine gluconate (MEDLINE KIT)  15 mL Mouth Rinse BID  . Chlorhexidine Gluconate Cloth  6 each Topical Daily  . heparin injection (subcutaneous)  5,000 Units Subcutaneous Q8H  . insulin aspart  0-9 Units Subcutaneous Q8H  . ipratropium-albuterol  3 mL Nebulization TID  . mouth rinse  15 mL Mouth Rinse BID  . pantoprazole (PROTONIX) IV  40 mg Intravenous Q12H   Continuous Infusions: . piperacillin-tazobactam (ZOSYN)  IV 3.375 g (07/03/20 0828)  . TPN ADULT (ION) 80 mL/hr at 07/03/20 0400  . TPN ADULT (ION)      LOS: 5 days   Critical Care Procedure Note Authorized and Performed by: CMurvin NatalMD  Total Critical Care time:  30 minutes  Due to a high probability of clinically significant, life threatening deterioration, the patient required my highest level of preparedness to intervene emergently and I personally spent this critical care time directly and personally managing the patient.  This critical care time included obtaining a history; examining the patient, pulse oximetry; ordering and review of studies; arranging urgent treatment with development of a management plan; evaluation of patient's response of treatment; frequent reassessment; and discussions with other providers.  This critical care time was performed to assess and manage the high probability of imminent and life threatening deterioration that could result in multi-organ  failure.  It was exclusive of separately billable procedures and treating other patients and teaching time.   Irwin Brakeman, MD How to contact the [facility] Attending or  Consulting provider [place] or covering provider during after hours [place], for this patient?  1. Check the care team in [facility] and look for a) attending/consulting [facility] provider listed and b) the [facility] team listed 2. Log into [email or web address] and use [facility]'s universal password to access. If you do not have the password, please contact the hospital operator. 3. Locate the [facility] provider you are looking for under [facility] and page to a number that you can be directly reached. 4. If you still have difficulty reaching the provider, please page the [facility] (Director on Call) for the Hospitalists listed on amion for assistance.  07/03/2020, 10:02 AM

## 2020-07-04 LAB — CBC
HCT: 27.9 % — ABNORMAL LOW (ref 39.0–52.0)
Hemoglobin: 9.2 g/dL — ABNORMAL LOW (ref 13.0–17.0)
MCH: 30.5 pg (ref 26.0–34.0)
MCHC: 33 g/dL (ref 30.0–36.0)
MCV: 92.4 fL (ref 80.0–100.0)
Platelets: 368 10*3/uL (ref 150–400)
RBC: 3.02 MIL/uL — ABNORMAL LOW (ref 4.22–5.81)
RDW: 14.7 % (ref 11.5–15.5)
WBC: 15.8 10*3/uL — ABNORMAL HIGH (ref 4.0–10.5)
nRBC: 0 % (ref 0.0–0.2)

## 2020-07-04 LAB — COMPREHENSIVE METABOLIC PANEL
ALT: 16 U/L (ref 0–44)
AST: 20 U/L (ref 15–41)
Albumin: 1.9 g/dL — ABNORMAL LOW (ref 3.5–5.0)
Alkaline Phosphatase: 99 U/L (ref 38–126)
Anion gap: 9 (ref 5–15)
BUN: 40 mg/dL — ABNORMAL HIGH (ref 8–23)
CO2: 24 mmol/L (ref 22–32)
Calcium: 8.4 mg/dL — ABNORMAL LOW (ref 8.9–10.3)
Chloride: 104 mmol/L (ref 98–111)
Creatinine, Ser: 1.3 mg/dL — ABNORMAL HIGH (ref 0.61–1.24)
GFR, Estimated: 56 mL/min — ABNORMAL LOW (ref >60)
Glucose, Bld: 118 mg/dL — ABNORMAL HIGH (ref 70–99)
Potassium: 4 mmol/L (ref 3.5–5.1)
Sodium: 137 mmol/L (ref 135–145)
Total Bilirubin: 0.9 mg/dL (ref 0.3–1.2)
Total Protein: 5.5 g/dL — ABNORMAL LOW (ref 6.5–8.1)

## 2020-07-04 LAB — GLUCOSE, CAPILLARY
Glucose-Capillary: 123 mg/dL — ABNORMAL HIGH (ref 70–99)
Glucose-Capillary: 126 mg/dL — ABNORMAL HIGH (ref 70–99)
Glucose-Capillary: 121 mg/dL — ABNORMAL HIGH (ref 70–99)
Glucose-Capillary: 103 mg/dL — ABNORMAL HIGH (ref 70–99)

## 2020-07-04 LAB — TRIGLYCERIDES: Triglycerides: 101 mg/dL (ref <150)

## 2020-07-04 LAB — DIFFERENTIAL
Abs Immature Granulocytes: 0.91 10*3/uL — ABNORMAL HIGH (ref 0.00–0.07)
Basophils Absolute: 0.1 10*3/uL (ref 0.0–0.1)
Basophils Relative: 0 %
Eosinophils Absolute: 0.3 10*3/uL (ref 0.0–0.5)
Eosinophils Relative: 2 %
Immature Granulocytes: 6 %
Lymphocytes Relative: 17 %
Lymphs Abs: 2.6 10*3/uL (ref 0.7–4.0)
Monocytes Absolute: 1.2 10*3/uL — ABNORMAL HIGH (ref 0.1–1.0)
Monocytes Relative: 8 %
Neutro Abs: 10.7 10*3/uL — ABNORMAL HIGH (ref 1.7–7.7)
Neutrophils Relative %: 67 %

## 2020-07-04 LAB — MAGNESIUM: Magnesium: 2 mg/dL (ref 1.7–2.4)

## 2020-07-04 LAB — PREALBUMIN: Prealbumin: 6 mg/dL — ABNORMAL LOW (ref 18–38)

## 2020-07-04 LAB — PHOSPHORUS: Phosphorus: 3.8 mg/dL (ref 2.5–4.6)

## 2020-07-04 MED ORDER — TRAVASOL 10 % IV SOLN
INTRAVENOUS | Status: AC
  Filled 2020-07-04: qty 921.6

## 2020-07-04 MED ORDER — IPRATROPIUM-ALBUTEROL 0.5-2.5 (3) MG/3ML IN SOLN: 3.0000 mL | RESPIRATORY_TRACT | Status: DC | PRN

## 2020-07-04 MED ORDER — PIPERACILLIN-TAZOBACTAM 3.375 G IVPB
3.3750 g | Freq: Three times a day (TID) | INTRAVENOUS | Status: DC
  Administered 2020-07-04 – 2020-07-12 (×24): 3.375 g via INTRAVENOUS
  Filled 2020-07-04 (×24): qty 50

## 2020-07-04 NOTE — Progress Notes (Addendum)
PROGRESS NOTE   Frederick [name]  ZOX:[id number] DOB: Sep 30, 1940 DOA: 06/27/2020 PCP: Patient, No Pcp Per   Chief Complaint  Patient presents with  . Abdominal Pain   Brief Admission History:  79 y.o. male with no documented chronic medical problems presenting with 2-week history of abdominal pain that was intermittent in nature, occasionally worse with food.  The patient has not seen a physician for nearly 5 years.  He does not take any prescription medications.  Apparently, the patient ate some chocolate on 06/27/2020 after which he had unrelenting epigastric abdominal pain.  There was some nausea without any emesis.  The patient denies any NSAIDs.  Because of his abdominal pain, the patient presented for further evaluation.  He had denied any chest pain, shortness breath, diarrhea, dysuria.  In the emergency department, the patient was afebrile with soft blood pressures.  CT of the abdomen and pelvis showed moderate free air in the upper abdomen with a small amount of free fluid consistent with perforated viscus.  It was suspected that this was likely in the prepyloric region of the stomach.  General surgery was consulted.  The patient was taken to the operating room and underwent an exploratory laparotomy with vascular omental patch placement.  Unfortunately, there was difficulty extubating the patient postoperatively.  On the morning of 06/28/2020, the patient began developing hypotension.  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    Assessment & Plan:   Principal Problem:   Gastric perforation, acute Active Problems:   Gastric perforation (HCC)   Acute respiratory failure with hypoxia (HCC)   Hyperkalemia   AKI (acute kidney injury) ([place])   Complete heart block (HCC)   Hypotension   Endotracheally intubated   Perforated abdominal viscus  1. POD#6 s/p Exp laparotomy with omental patch for perforated gastric ulcer - Pt is recovering postop, blood pressures  improved, he is now extubated and off dobutamine.  Consulted to pharm D for TPN infusion started 10/28.  Unfortunately he has bilious output from drain and CT shows a small leak.  He will need to remain NPO.  Continue TPN per surgery team.  NG tube was not able to be placed after multiple attempts.  IV protonix 40 mg every 12 hours.    2. Septic Shock.  Hypotension - resolved now.  He was briefly on dobutamine infusion which is now discontinued. 3. Leukocytosis - follow CBC, continue Zosyn for now.    4. Hypertension - IV hydralazine ordered.   5. Acute respiratory failure with hypoxia - Pt has now been extubated and on nasal cannula.  PCCM consulted.   6. Hyperkalemia - RESOLVED.  Treated aggressively and resolved now, following.  7. Hypokalemia / Hypophos - IV replacement, recheck in AM.  8. AV block - Appreciate cardiology consultation. Follow for now.  Avoiding beta blockers.  Arrange home cardiac monitor at discharge.   9. Stage 4 CKD - creatinine improved with IV fluids and supportive measures.  Following.  10. Acute delirium - haldol IV prn.  Delirium precautions.    DVT prophylaxis: sQ heparin  Code Status:  Full  Family Communication: updated at bedside Disposition: TBD   Status is: Inpatient  Remains inpatient appropriate because:Hemodynamically unstable, Persistent severe electrolyte disturbances, IV treatments appropriate due to intensity of illness or inability to take PO and Inpatient level of care appropriate due to severity of illness.  Pt remains on TPN.  The plan is for patient to have UGI study on  Wed to evaluate for leak. If no leak, can advance diet and DC TPN and central line.   Dispo: The patient is from: Home              Anticipated d/c is to: TBD              Anticipated d/c date is: > 3 days              Patient currently is not medically stable to d/c.  Consultants:   Cardiology  pccm  Surgery   Procedures:   Central line placement 10/26  Exp lap with  omental patch 10/26  Antimicrobials:  Zosyn 10/26>>  Subjective: Pt denies abdominal pain.     Objective: Vitals:   07/04/20 0800 07/04/20 1000 07/04/20 1100 07/04/20 1200  BP: 140/62 (!) 127/46 (!) 124/51 (!) 148/69  Pulse: 89 88 72 67  Resp: (!) _0 (!) 21  Temp:      TempSrc:      SpO2: 99% 99% 99% 100%  Weight:      Height:        Intake/Output Summary (Last 24 hours) at 07/04/2020 1316 Last data filed at 07/04/2020 1236 Gross per 24 hour  Intake 2385.93 ml  Output 1800 ml  Net 585.93 ml   Filed Weights   06/29/20 1700 07/02/20 0439 07/03/20 0400  Weight: 70.1 kg 70.5 kg 66.4 kg   Examination:  General exam: elderly chronically ill appearing male, awake, alert, NAD, less agitated today.   Respiratory system: Clear to auscultation. Respiratory effort normal. No crackles or wheezes heard.  Cardiovascular system: normal S1 & S2 heard. No JVD, murmurs, rubs, gallops or clicks. No pedal edema. Gastrointestinal system: Abdomen is nondistended, soft and light incisional tenderness, wound clean and dry and intact. No organomegaly or masses felt. hypoactive BS. JP drain in place.   Central nervous system: Alert and oriented to person only. No focal neurological deficits. Extremities: Symmetric 5 x 5 power. Skin: No rashes, lesions or ulcers Psychiatry: intermittently agitated.   Data Reviewed: I have personally reviewed following labs and imaging studies  CBC: Recent Labs  Lab 06/30/20 0447 07/01/20 0412 07/02/20 0521 07/03/20 0523 07/04/20 0507  WBC 12.5* 14.0* 12.8* 14.8* 15.8*  NEUTROABS 10.7* 12.0* 10.0* 10.6* 10.7*  HGB 8.5* 9.1* 8.9* 9.3* 9.2*  HCT 26.8* 28.7* 27.0* 28.5* 27.9*  MCV 97.5 97.3 95.7 94.4 92.4  PLT 253 284 277 323 414   Basic Metabolic Panel: Recent Labs  Lab 06/29/20 0437 06/29/20 0437 06/30/20 0447 07/01/20 0412 07/02/20 0521 07/03/20 0523 07/04/20 0507  NA 137   < > 140 141 141 137 137  K 4.4   < > 3.9 3.7 3.4* 3.4* 4.0  CL  103   < > 105 105 105 102 104  CO2 26   < > _1 GLUCOSE 84   < > 107* 132* 124* 105* 118*  BUN 54*   < > 43* 33* 31* 33* 40*  CREATININE 2.40*   < > 1.82* 1.61* 1.39* 1.21 1.30*  CALCIUM 7.7*   < > 7.8* 8.1* 8.2* 8.3* 8.4*  MG 2.3  --  2.3 2.2 2.0  --  2.0  PHOS 4.3   < > 3.1 2.4* 2.3* 3.4 3.8   < > = values in this interval not displayed.   GFR: Estimated Creatinine Clearance: 42.6 mL/min (A) (by C-G formula based on SCr of 1.3 mg/dL (H)).  Liver Function Tests:  Recent Labs  Lab 06/30/20 0447 07/01/20 0412 07/02/20 0521 07/03/20 0523 07/04/20 0507  AST _0 ALT _1 ALKPHOS 37* 63 97 95 99  BILITOT 0.5 0.5 0.8 0.6 0.9  PROT 4.9* 5.4* 5.2* 5.5* 5.5*  ALBUMIN 1.9* 2.0* 1.9* 2.0* 1.9*    CBG: Recent Labs  Lab 07/03/20 1120 07/03/20 1638 07/03/20 1952 07/04/20 0417 07/04/20 0828  GLUCAP 122* 123* 110* 123* 126*    Recent Results (from the past 240 hour(s))  Respiratory Panel by RT PCR (Flu A&B, Covid) - Nasopharyngeal Swab     Status: None   Collection Time: 06/27/20  9:48 PM   Specimen: Nasopharyngeal Swab  Result Value Ref Range Status   SARS Coronavirus 2 by RT PCR NEGATIVE NEGATIVE Final    Comment: (NOTE) SARS-CoV-2 target nucleic acids are NOT DETECTED.  The SARS-CoV-2 RNA is generally detectable in upper respiratoy specimens during the acute phase of infection. The lowest concentration of SARS-CoV-2 viral copies this assay can detect is 131 copies/mL. A negative result does not preclude SARS-Cov-2 infection and should not be used as the sole basis for treatment or other patient management decisions. A negative result may occur with  improper specimen collection/handling, submission of specimen other than nasopharyngeal swab, presence of viral mutation(s) within the areas targeted by this assay, and inadequate number of viral copies (<131 copies/mL). A negative result must be combined with clinical observations, patient  history, and epidemiological information. The expected result is Negative.  Fact Sheet for Patients:  PinkCheek.be  Fact Sheet for Healthcare Providers:  GravelBags.it  This test is no t yet approved or cleared by the [place] FDA and  has been authorized for detection and/or diagnosis of SARS-CoV-2 by FDA under an Emergency Use Authorization (EUA). This EUA will remain  in effect (meaning this test can be used) for the duration of the COVID-19 declaration under Section 564(b)(1) of the Act, 21 U.S.C. section 360bbb-3(b)(1), unless the authorization is terminated or revoked sooner.     Influenza A by PCR NEGATIVE NEGATIVE Final   Influenza B by PCR NEGATIVE NEGATIVE Final    Comment: (NOTE) The Xpert Xpress SARS-CoV-2/FLU/RSV assay is intended as an aid in  the diagnosis of influenza from Nasopharyngeal swab specimens and  should not be used as a sole basis for treatment. Nasal washings and  aspirates are unacceptable for Xpert Xpress SARS-CoV-2/FLU/RSV  testing.  Fact Sheet for Patients: PinkCheek.be  Fact Sheet for Healthcare Providers: GravelBags.it  This test is not yet approved or cleared by the [place] FDA and  has been authorized for detection and/or diagnosis of SARS-CoV-2 by  FDA under an Emergency Use Authorization (EUA). This EUA will remain  in effect (meaning this test can be used) for the duration of the  Covid-19 declaration under Section 564(b)(1) of the Act, 21  U.S.C. section 360bbb-3(b)(1), unless the authorization is  terminated or revoked. Performed at [facility], [address], [place], [place] [address]   Aerobic/Anaerobic Culture (surgical/deep wound)     Status: None   Collection Time: 06/28/20  2:45 AM   Specimen: PATH Other; Tissue  Result Value Ref Range Status   Specimen Description   Final    PERITONEAL Performed  at [facility], [address], [place], [place] [address]    Special Requests   Final    GASTRIC ULCER PREFORATION Performed at [facility], [address] [place], [place] [address]  Gram Stain   Final    RARE WBC PRESENT, PREDOMINANTLY PMN NO ORGANISMS SEEN    Culture   Final    RARE Multiple Species Consistent With Normal Fecal Flora RARE CANDIDA GLABRATA NO ANAEROBES ISOLATED Performed at [facility], [address], [place], [place] [address]    Report Status 07/03/2020 FINAL  Final  Culture, blood (Routine X 2) w Reflex to ID Panel     Status: None   Collection Time: 06/28/20  3:21 PM   Specimen: BLOOD  Result Value Ref Range Status   Specimen Description BLOOD LEFT ANTECUBITAL  Final   Special Requests   Final    BOTTLES DRAWN AEROBIC AND ANAEROBIC Blood Culture adequate volume   Culture   Final    NO GROWTH 5 DAYS Performed at [facility], [address] [place], [place] [address]    Report Status 07/03/2020 FINAL  Final  Culture, blood (Routine X 2) w Reflex to ID Panel     Status: None   Collection Time: 06/28/20  3:21 PM   Specimen: BLOOD LEFT HAND  Result Value Ref Range Status   Specimen Description BLOOD LEFT HAND  Final   Special Requests   Final    BOTTLES DRAWN AEROBIC AND ANAEROBIC Blood Culture adequate volume   Culture   Final    NO GROWTH 5 DAYS Performed at [facility], [address] [place], [place] [address]    Report Status 07/03/2020 FINAL  Final  Culture, Urine     Status: None   Collection Time: 06/28/20  3:27 PM   Specimen: Urine, Clean Catch  Result Value Ref Range Status   Specimen Description   Final    URINE, CLEAN CATCH Performed at [facility], [address], [place], [place] [address]    Special Requests   Final    NONE Performed at [facility], [address], [place], [place] [address]    Culture   Final    NO GROWTH Performed at [facility], [place] [address] [place], [place] [address]    Report Status  06/30/2020 FINAL  Final     Radiology Studies: No results found. Scheduled Meds: . chlorhexidine gluconate (MEDLINE KIT)  15 mL Mouth Rinse BID  . Chlorhexidine Gluconate Cloth  6 each Topical Daily  . heparin injection (subcutaneous)  5,000 Units Subcutaneous Q8H  . insulin aspart  0-9 Units Subcutaneous Q8H  . mouth rinse  15 mL Mouth Rinse BID  . pantoprazole (PROTONIX) IV  40 mg Intravenous Q12H   Continuous Infusions: . piperacillin-tazobactam (ZOSYN)  IV    . TPN ADULT (ION) 80 mL/hr at 07/04/20 1236  . TPN ADULT (ION)      LOS: 6 days   Critical Care Procedure Note Authorized and Performed by: Murvin Natal MD  Total Critical Care time:  32 minutes  Due to a high probability of clinically significant, life threatening deterioration, the patient required my highest level of preparedness to intervene emergently and I personally spent this critical care time directly and personally managing the patient.  This critical care time included obtaining a history; examining the patient, pulse oximetry; ordering and review of studies; arranging urgent treatment with development of a management plan; evaluation of patient's response of treatment; frequent reassessment; and discussions with other providers.  This critical care time was performed to assess and manage the high probability of imminent and life threatening deterioration that could result in multi-organ failure.  It was  exclusive of separately billable procedures and treating other patients and teaching time.   Irwin Brakeman, MD How to contact the [facility] Attending or Consulting provider [place] or covering provider during after hours [place], for this patient?  1. Check the care team in [facility] and look for a) attending/consulting TRH provider listed and b) the [facility] team listed 2. Log into [email or web address] and use [place]'s universal password to access. If you do not have the password, please contact the hospital operator. 3. Locate the  [facility] provider you are looking for under [facility] and page to a number that you can be directly reached. 4. If you still have difficulty reaching the provider, please page the [facility] (Director on Call) for the Hospitalists listed on amion for assistance.  07/04/2020, 1:16 PM

## 2020-07-04 NOTE — Progress Notes (Addendum)
I was present with the medical student for this service. I personally verified the history of present illness, performed the physical exam, and made the plan for this encounter. I have verified the medical student's documentation and made modifications where appropriately. I have personally documented in my own words a brief history, physical, and plan below.     Doing well overall. NPO TPN for now Will need H pylori treatment once on po empirically PPI Central line in place since 10/26 will continue for now for TPN UGI likely Wednesday Drain with some minimal suds some bile decreasing  Frederick Labrum, Frederick [name] [place], [place] [address] [phone] (office)    [facility] Progress Note  6 Days Post-Op  Subjective: Pt denies nausea, vomiting, and pain this AM. Reports discomfort around central line and thirst. No ON events. Pt and neighbor/friend Suanne Marker report they have not yet completed POA paperwork.  Objective: Vital signs in last 24 hours: Temp:  [98 F (36.7 C)-98.4 F (36.9 C)] 98.2 F (36.8 C) (11/01 0749) Pulse Rate:  [71-102] 89 (11/01 0800) Resp:  [15-29] 21 (11/01 0800) BP: (122-203)/(47-122) 140/62 (11/01 0800) SpO2:  [95 %-100 %] 99 % (11/01 0800) Last BM Date: 07/04/20  Intake/Output from previous day: 10/31 0701 - 11/01 0700 In: 2045.7 [I.V.:1907.4; IV Piggyback:138.3] Out: 1800 [Urine:1800] Intake/Output this shift: Total I/O In: 26.9 [IV Piggyback:26.9] Out: -    Physical Exam:  Constitutional: alert, cooperative and no distress  Respiratory: breathing non-labored at rest  Cardiovascular: regular rate and sinus rhythm  Gastrointestinal: soft, non-tender, and non-distended, JP with minimal bile tinged serous fluid in bulb.   Incision: Dressing intact, no erythema no discharge.   Lab Results:  Recent Labs    07/03/20 0523 07/04/20 0507  WBC 14.8* 15.8*  HGB 9.3* 9.2*   HCT 28.5* 27.9*  PLT 323 368   BMET Recent Labs    07/03/20 0523 07/04/20 0507  NA 137 137  K 3.4* 4.0  CL 102 104  CO2 26 24  GLUCOSE 105* 118*  BUN 33* 40*  CREATININE 1.21 1.30*  CALCIUM 8.3* 8.4*   PT/INR No results for input(s): LABPROT, INR in the last 72 hours.  Studies/Results: No results found.  Anti-infectives: Anti-infectives (From admission, onward)   Start     Dose/Rate Route Frequency Ordered Stop   07/01/20 1700  piperacillin-tazobactam (ZOSYN) IVPB 3.375 g        3.375 g 12.5 mL/hr over 240 Minutes Intravenous Every 8 hours 07/01/20 1248 07/05/20 0059   07/01/20 1400  tetracycline (SUMYCIN) capsule 500 mg  Status:  Discontinued       Note to Pharmacy: Empiric h pylori treatment   500 mg Oral 4 times daily 07/01/20 1203 07/01/20 1236   07/01/20 1400  metroNIDAZOLE (FLAGYL) tablet 250 mg  Status:  Discontinued       Note to Pharmacy: Empiric h pylori treatment   250 mg Oral 4 times daily 07/01/20 1203 07/01/20 1236   06/29/20 1000  piperacillin-tazobactam (ZOSYN) IVPB 3.375 g  Status:  Discontinued        3.375 g 12.5 mL/hr over 240 Minutes Intravenous Every 8 hours 06/29/20 0907 07/01/20 1205   06/28/20 2200  piperacillin-tazobactam (ZOSYN) IVPB 3.375 g  Status:  Discontinued        3.375 g 12.5 mL/hr over 240 Minutes Intravenous Every 12 hours 06/28/20 1407 06/29/20 0907   06/28/20 0600  piperacillin-tazobactam (ZOSYN) IVPB 3.375 g  Status:  Discontinued        3.375 g 12.5 mL/hr over 240 Minutes Intravenous Every 8 hours 06/28/20 0412 06/28/20 1407   06/28/20 0130  cefoTEtan (CEFOTAN) 2 g in sodium chloride 0.9 % 100 mL IVPB        2 g 200 mL/hr over 30 Minutes Intravenous On call to O.R. 06/27/20 2323 06/28/20 0200   06/27/20 2215  piperacillin-tazobactam (ZOSYN) IVPB 3.375 g        3.375 g 100 mL/hr over 30 Minutes Intravenous  Once 06/27/20 2203 06/27/20 2305      Assessment/Plan: s/p Procedure(s): EXPLORATORY LAPAROTOMY, graham patch   LOS: 6 days    Pt doing well on post-op day 6 following exploratory laparotomy w/ graham patch for perforated gastric ulcer w/ stable labs, no new imaging following leak identified 3 days ago. Plan as follows:  - Continue TPN, NPO - Ok to keep NG tube out - Upper GI Wednesday - PT for mobilization - Empiric H. Pylori treatment - Secure central line w/ fresh dressing, tape - Chaplain contacted to help pt and neighbor w/ POA documentation   Raliegh Ip 07/04/2020

## 2020-07-04 NOTE — Evaluation (Signed)
Physical Therapy Evaluation Patient Details Name: Frederick [name] MRN: [id number] DOB: [birth year] Today's Date: 07/04/2020   History of Present Illness  Read Bonelli is a 79 y.o. male s/p Exploratory laparotomy, vascularized omental patch on 06/28/20 who does not seek medical attention and has no PCP and takes no medications. He came in with acute onset of abdominal pain in his epigastric area after eating chocolate earlier today. The pain comes in waves and is not associated with any vomiting.    Clinical Impression  Patient demonstrates slow labored movement for sitting up at bedside requiring Mod/max assist to sit up from side lying position, very unsteady on feet with kyphotic posture due to weakness, at high risk for falls and limited to a few side steps at bedside before having to sit.  Patient tolerated sitting up in chair after therapy - RN aware.  Patient will benefit from continued physical therapy in hospital and recommended venue below to increase strength, balance, endurance for safe ADLs and gait.     Follow Up Recommendations SNF;Supervision for mobility/OOB;Supervision/Assistance - 24 hour    Equipment Recommendations  Rolling walker with 5" wheels    Recommendations for Other Services       Precautions / Restrictions Precautions Precautions: Fall Restrictions Weight Bearing Restrictions: No      Mobility  Bed Mobility Overal bed mobility: Needs Assistance Bed Mobility: Rolling;Sidelying to Sit Rolling: Mod assist Sidelying to sit: Mod assist;Max assist       General bed mobility comments: slow labored movement    Transfers Overall transfer level: Needs assistance Equipment used: Rolling walker (2 wheeled) Transfers: Sit to/from [facility] Sit to Stand: Mod assist Stand pivot transfers: Mod assist       General transfer comment: unsteady on feet, flexed trunk  Ambulation/Gait Ambulation/Gait assistance: Mod assist;Max assist Gait  Distance (Feet): 5 Feet Assistive device: Rolling walker (2 wheeled) Gait Pattern/deviations: Decreased step length - right;Decreased step length - left;Decreased stride length Gait velocity: slow   General Gait Details: very unsteady labored movement with difficulty advancing BLE due to weakness, poor standing balance  Stairs            Wheelchair Mobility    Modified Rankin (Stroke Patients Only)       Balance Overall balance assessment: Needs assistance Sitting-balance support: Feet supported;No upper extremity supported Sitting balance-Leahy Scale: Fair Sitting balance - Comments: seated at EOB   Standing balance support: Bilateral upper extremity supported Standing balance-Leahy Scale: Poor Standing balance comment: using RW                             Pertinent Vitals/Pain Pain Assessment: No/denies pain    Home Living Family/patient expects to be discharged to:: Private residence Living Arrangements: Alone Available Help at Discharge: Family;Friend(s);Available 24 hours/day Type of Home: House Home Access: Stairs to enter Entrance Stairs-Rails: None Entrance Stairs-Number of Steps: 3 Home Layout: Multi-level;Laundry or work area in basement;Able to live on main level with bedroom/bathroom Home Equipment: Kasandra Knudsen - single point;Shower seat;Bedside commode      Prior Function Level of Independence: Independent with assistive device(s)         Comments: [employer], drives     Hand Dominance        Extremity/Trunk Assessment   Upper Extremity Assessment Upper Extremity Assessment: Generalized weakness    Lower Extremity Assessment Lower Extremity Assessment: Generalized weakness    Cervical / Trunk Assessment Cervical /  Trunk Assessment: Kyphotic  Communication   Communication: No difficulties  Cognition Arousal/Alertness: Awake/alert Behavior During Therapy: WFL for tasks assessed/performed Overall Cognitive  Status: Within Functional Limits for tasks assessed                                        General Comments      Exercises     Assessment/Plan    PT Assessment Patient needs continued PT services  PT Problem List Decreased strength;Decreased activity tolerance;Decreased balance;Decreased mobility       PT Treatment Interventions DME instruction;Balance training;Gait training;Stair training;Functional mobility training;Therapeutic activities;Therapeutic exercise;Patient/family education    PT Goals (Current goals can be found in the Care Plan section)  Acute Rehab PT Goals Patient Stated Goal: return home with family/friends to assist PT Goal Formulation: With patient Time For Goal Achievement: 07/18/20 Potential to Achieve Goals: Good    Frequency Min 3X/week   Barriers to discharge        Co-evaluation               AM-PAC PT "6 Clicks" Mobility  Outcome Measure Help needed turning from your back to your side while in a flat bed without using bedrails?: A Lot Help needed moving from lying on your back to sitting on the side of a flat bed without using bedrails?: A Lot Help needed moving to and from a bed to a chair (including a wheelchair)?: A Lot Help needed standing up from a chair using your arms (e.g., wheelchair or bedside chair)?: A Lot Help needed to walk in hospital room?: A Lot Help needed climbing 3-5 steps with a railing? : Total 6 Click Score: 11    End of Session   Activity Tolerance: Patient tolerated treatment well;Patient limited by fatigue Patient left: in chair;with call bell/phone within reach Nurse Communication: Mobility status PT Visit Diagnosis: Unsteadiness on feet (R26.81);Other abnormalities of gait and mobility (R26.89);Muscle weakness (generalized) (M62.81)    Time: 1761-6073 PT Time Calculation (min) (ACUTE ONLY): 35 min   Charges:   PT Evaluation $PT Eval Moderate Complexity: 1 Mod PT  Treatments $Therapeutic Activity: 23-37 mins        3:48 PM, 07/04/20 Lonell Grandchild, MPT Physical Therapist with [facility] [phone] office [phone] mobile phone

## 2020-07-04 NOTE — Progress Notes (Signed)
PHARMACY - TOTAL PARENTERAL NUTRITION CONSULT NOTE    Indication: gastric perforation  Patient Measurements: Height: 5\' 8"  (172.7 cm) Weight: 66.4 kg (146 lb 6.2 oz) IBW/kg (Calculated) : 68.4 TPN AdjBW (KG): 63.5 Body mass index is 22.26 kg/m. Usual Weight:    Assessment: Per RD note: Inadequate oral intake related to acute illness (gastric ulcer perforation s/p ex-lap with omental patch) as evidenced by NPO status.  Glucose / Insulin: 123-130-->  SSI q8h sensitive scale ordered. 2 unit insulin given Electrolytes:    WNL Phos 2.3 Renal:  Scr 1.61 > 1.39 LFT:  WNL TG: 129 Prealbumin:  6.9  Albumin: 1.9 Intake / Output:   Intake/Output Summary (Last 24 hours) at 07/04/2020 0834 Last data filed at 07/04/2020 0500 Gross per 24 hour  Intake 2045.72 ml  Output 1800 ml  Net 245.72 ml    GI Imaging:  10/29 UGI found continued leak, so TPN to continue Surgeries / Procedures:   s/p exp laparotomy w/ omental patch for perforated gastric ulcer  Central access:  CVC Triple Lumen placed 06/28/20 -->rt internal jugular TPN start date:  06-30-20 at 1800  Nutritional Goals (per RD recommendation on 06-30-20): kCal: 4142-3953  Protein: 84-95g  Fluid: >1700 mL Goal TPN rate is 80 mL/hr (provides 92 g of protein and ~1827 kcals per day)  Current Nutrition:  NG dislodged  10-29 Plan is to try to advance diet today to clears   Plan:  Continue TPN at 80 mL/hr at 1800  Electrolytes in TPN: 66mEq/L of Na, 60mEq/L of K, 40mEq/L of Ca, 44mEq/L of Mg, and 20 mmol/L of Phos.  Cl:Ac ratio 1:1 Add standard MVI and trace elements to TPN Adjust sliding scale to sensitive q8h and adjust as needed  Monitor TPN labs on Mon/Thurs   Margot Ables, PharmD Clinical Pharmacist 07/04/2020 8:34 AM

## 2020-07-04 NOTE — ACP (Advance Care Planning) (Signed)
Assisted Frederick [name] completing his HCPOA. Placed a copy in his chart and gave him original and copies.

## 2020-07-04 NOTE — TOC Initial Note (Signed)
Transition of Care [facility]) - Initial/Assessment Note    Patient Details  Name: Frederick [name] MRN: [id number] Date of Birth: [birth year]  [facility] CM/SW Contact:    Boneta Lucks, RN Phone Number: 07/04/2020, 12:17 PM  Clinical Narrative:       Patient admitted with acute gastric perforation. TOC spoke with daughter Marita [place], she and her sister live out of town. She states her father is very stubborn, does not go to the doctor and only allows a neighbor Suanne Marker to come in to check on him in the home.  TOC was assessing the need for Home health.  PT eval is pending. Marita Kansas would like for Home health to check on him, but feels he will refuse. TOC to follow, discharge planning is for the end of the week.            Expected Discharge Plan: [place] Barriers to Discharge: Continued Medical Work up   Patient Goals and CMS Choice Patient states their goals for this hospitalization and ongoing recovery are:: to go home. CMS Medicare.gov Compare Post Acute Care list provided to:: Patient Choice offered to / list presented to : Adult Children  Expected Discharge Plan and Services Expected Discharge Plan: [place]       Living arrangements for the past 2 months: Single Family Home          Prior Living Arrangements/Services Living arrangements for the past 2 months: Single Family Home Lives with:: Self          Need for Family Participation in Patient Care: Yes (Comment) Care giver support system in place?: Yes (comment)   Criminal Activity/Legal Involvement Pertinent to Current Situation/Hospitalization: No - Comment as needed   Permission Sought/Granted      Share Information with NAME: Marita [place]     Permission granted to share info w Relationship: Daughter     Emotional Assessment     Admission diagnosis:  AKI (acute kidney injury) ([place]) [N17.9] Perforated abdominal viscus [R19.8] Gastric perforation ([place])  [K25.5] Patient Active Problem List   Diagnosis Date Noted  . Endotracheally intubated   . Perforated abdominal viscus   . Gastric perforation ([place]) 06/28/2020  . Acute respiratory failure with hypoxia ([place]) 06/28/2020  . Hyperkalemia 06/28/2020  . Gastric perforation, acute   . AKI (acute kidney injury) ([place])   . Complete heart block ([place])   . Hypotension    PCP:  Patient, No Pcp Per Pharmacy:  No Pharmacies Listed

## 2020-07-04 NOTE — Plan of Care (Signed)
  Problem: Acute Rehab PT Goals(only PT should resolve) Goal: Pt Will Go Supine/Side To Sit Outcome: Progressing Flowsheets (Taken 07/04/2020 1550) Pt will go Supine/Side to Sit:  with minimal assist  with moderate assist Goal: Patient Will Transfer Sit To/From Stand Outcome: Progressing Flowsheets (Taken 07/04/2020 1550) Patient will transfer sit to/from stand:  with minimal assist  with moderate assist Goal: Pt Will Transfer Bed To Chair/Chair To Bed Outcome: Progressing Flowsheets (Taken 07/04/2020 1550) Pt will Transfer Bed to Chair/Chair to Bed:  with min assist  with mod assist Goal: Pt Will Ambulate Outcome: Progressing Flowsheets (Taken 07/04/2020 1550) Pt will Ambulate:  25 feet  with minimal assist  with moderate assist  with rolling walker   3:51 PM, 07/04/20 Lonell Grandchild, MPT Physical Therapist with [facility] [phone] office [phone] mobile phone

## 2020-07-05 LAB — GLUCOSE, CAPILLARY
Glucose-Capillary: 122 mg/dL — ABNORMAL HIGH (ref 70–99)
Glucose-Capillary: 109 mg/dL — ABNORMAL HIGH (ref 70–99)
Glucose-Capillary: 117 mg/dL — ABNORMAL HIGH (ref 70–99)
Glucose-Capillary: 125 mg/dL — ABNORMAL HIGH (ref 70–99)
Glucose-Capillary: 122 mg/dL — ABNORMAL HIGH (ref 70–99)

## 2020-07-05 LAB — PHOSPHORUS: Phosphorus: 3.5 mg/dL (ref 2.5–4.6)

## 2020-07-05 MED ORDER — SODIUM CHLORIDE 0.9 % IV SOLN
200.0000 mg | Freq: Once | INTRAVENOUS | Status: AC
  Administered 2020-07-05: 200 mg via INTRAVENOUS
  Filled 2020-07-05: qty 200

## 2020-07-05 MED ORDER — TRAVASOL 10 % IV SOLN
INTRAVENOUS | Status: AC
  Filled 2020-07-05: qty 921.6

## 2020-07-05 MED ORDER — SODIUM CHLORIDE 0.9 % IV SOLN
100.0000 mg | INTRAVENOUS | Status: DC
  Administered 2020-07-06 – 2020-07-12 (×7): 100 mg via INTRAVENOUS
  Filled 2020-07-05 (×8): qty 100

## 2020-07-05 NOTE — Progress Notes (Signed)
PROGRESS NOTE   Frederick [name]  EUM:[id number] DOB: February 20, 1941 DOA: 06/27/2020 PCP: Patient, No Pcp Per   Chief Complaint  Patient presents with  . Abdominal Pain   Brief Admission History:  79 y.o. male with no documented chronic medical problems presenting with 2-week history of abdominal pain that was intermittent in nature, occasionally worse with food.  The patient has not seen a physician for nearly 5 years.  He does not take any prescription medications.  Apparently, the patient ate some chocolate on 06/27/2020 after which he had unrelenting epigastric abdominal pain.  There was some nausea without any emesis.  The patient denies any NSAIDs.  Because of his abdominal pain, the patient presented for further evaluation.  He had denied any chest pain, shortness breath, diarrhea, dysuria.  In the emergency department, the patient was afebrile with soft blood pressures.  CT of the abdomen and pelvis showed moderate free air in the upper abdomen with a small amount of free fluid consistent with perforated viscus.  It was suspected that this was likely in the prepyloric region of the stomach.  General surgery was consulted.  The patient was taken to the operating room and underwent an exploratory laparotomy with vascular omental patch placement.  Unfortunately, there was difficulty extubating the patient postoperatively.  On the morning of 06/28/2020, the patient began developing hypotension.  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    Assessment & Plan:   Principal Problem:   Gastric perforation, acute Active Problems:   Gastric perforation (HCC)   Acute respiratory failure with hypoxia (HCC)   Hyperkalemia   AKI (acute kidney injury) ([place])   Complete heart block (HCC)   Hypotension   Endotracheally intubated   Perforated abdominal viscus  1. POD#7 s/p Exp laparotomy with omental patch for perforated gastric ulcer - Pt is recovering postop, blood pressures  improved, he is now extubated and off dobutamine.  Consulted to pharm D for TPN infusion started 10/28.  Unfortunately he has bilious output from drain and CT shows a small leak.  He will need to remain NPO.  Continue TPN per surgery team.  NG tube was not able to be placed after multiple attempts.  IV protonix 40 mg every 12 hours.   Plan is to repeat UGI on 11/3 to check for leak.  2. Septic Shock.  Hypotension - resolved now.  He was briefly on dobutamine infusion which is now discontinued. 3. Leukocytosis - follow CBC, continue Zosyn for now.    4. 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  5. Hypertension - IV hydralazine ordered.   6. Acute respiratory failure with hypoxia - Pt has now been extubated and on nasal cannula.  PCCM was consulted.   7. Hyperkalemia - RESOLVED.  Treated aggressively and resolved now, following.  8. Hypokalemia / Hypophos - IV replacement, recheck in AM.  9. AV block - Appreciate cardiology consultation. Follow for now.  Avoiding beta blockers.  Arrange home cardiac monitor at discharge.   10. Stage 4 CKD - creatinine improved with IV fluids and supportive measures.  Following.  11. Acute delirium - haldol IV prn.  Delirium precautions.    DVT prophylaxis: sQ heparin  Code Status:  Full  Family Communication:  Faythe Dingwall updated at bedside 11/1 Disposition: TBD   Status is: Inpatient  Remains inpatient appropriate because:Hemodynamically unstable, Persistent severe electrolyte disturbances, IV treatments appropriate due to intensity of illness or inability to take  PO and Inpatient level of care appropriate due to severity of illness.  Pt remains on TPN.  The plan is for patient to have UGI study on Wed to evaluate for leak. If no leak, can advance diet and DC TPN and central line.   Dispo: The patient is from: Home              Anticipated d/c is to: TBD              Anticipated d/c date is: > 3 days              Patient  currently is not medically stable to d/c.  Consultants:   Cardiology  pccm  Surgery   Procedures:   Central line placement 10/26  Exp lap with omental patch 10/26  Antimicrobials:  Zosyn 10/26>> anidulafungin 11/2>>  Subjective: Pt without complaint today.  No abdominal pain.     Objective: Vitals:   07/05/20 0400 07/05/20 0500 07/05/20 0600 07/05/20 1000  BP: (!) 154/59 (!) 163/66 (!) 163/83 (!) 154/72  Pulse: (!) 104 81 92 76  Resp: (!) 22 (!) 25 (!) 24 20  Temp:  98.6 F (37 C) 98.4 F (36.9 C) 98.4 F (36.9 C)  TempSrc:   Oral Oral  SpO2: 94% 97% 98% 98%  Weight:      Height:        Intake/Output Summary (Last 24 hours) at 07/05/2020 1240 Last data filed at 07/05/2020 0600 Gross per 24 hour  Intake 1056.78 ml  Output 2300 ml  Net -1243.22 ml   Filed Weights   06/29/20 1700 07/02/20 0439 07/03/20 0400  Weight: 70.1 kg 70.5 kg 66.4 kg   Examination:  General exam: elderly chronically ill appearing male, awake, alert, NAD, less agitated today.   Respiratory system: Clear to auscultation. Respiratory effort normal. No crackles or wheezes heard.  Cardiovascular system: normal S1 & S2 heard. No JVD, murmurs, rubs, gallops or clicks. No pedal edema. Gastrointestinal system: Abdomen is nondistended, soft and light incisional tenderness, wound clean and dry and intact. No organomegaly or masses felt. hypoactive BS. JP drain in place.   Central nervous system: Alert and oriented to person only. No focal neurological deficits. Extremities: Symmetric 5 x 5 power. Skin: No rashes, lesions or ulcers Psychiatry: intermittently agitated.   Data Reviewed: I have personally reviewed following labs and imaging studies  CBC: Recent Labs  Lab 06/30/20 0447 07/01/20 0412 07/02/20 0521 07/03/20 0523 07/04/20 0507  WBC 12.5* 14.0* 12.8* 14.8* 15.8*  NEUTROABS 10.7* 12.0* 10.0* 10.6* 10.7*  HGB 8.5* 9.1* 8.9* 9.3* 9.2*  HCT 26.8* 28.7* 27.0* 28.5* 27.9*  MCV 97.5  97.3 95.7 94.4 92.4  PLT 253 284 277 323 549   Basic Metabolic Panel: Recent Labs  Lab 06/29/20 0437 06/29/20 0437 06/30/20 0447 06/30/20 0447 07/01/20 0412 07/02/20 0521 07/03/20 0523 07/04/20 0507 07/05/20 0438  NA 137   < > 140  --  141 141 137 137  --   K 4.4   < > 3.9  --  3.7 3.4* 3.4* 4.0  --   CL 103   < > 105  --  105 105 102 104  --   CO2 26   < > 26  --  _0 --   GLUCOSE 84   < > 107*  --  132* 124* 105* 118*  --   BUN 54*   < > 43*  --  33* 31* 33* 40*  --  CREATININE 2.40*   < > 1.82*  --  1.61* 1.39* 1.21 1.30*  --   CALCIUM 7.7*   < > 7.8*  --  8.1* 8.2* 8.3* 8.4*  --   MG 2.3  --  2.3  --  2.2 2.0  --  2.0  --   PHOS 4.3   < > 3.1   < > 2.4* 2.3* 3.4 3.8 3.5   < > = values in this interval not displayed.   GFR: Estimated Creatinine Clearance: 42.6 mL/min (A) (by C-G formula based on SCr of 1.3 mg/dL (H)).  Liver Function Tests: Recent Labs  Lab 06/30/20 0447 07/01/20 0412 07/02/20 0521 07/03/20 0523 07/04/20 0507  AST _0 ALT _1 ALKPHOS 37* 63 97 95 99  BILITOT 0.5 0.5 0.8 0.6 0.9  PROT 4.9* 5.4* 5.2* 5.5* 5.5*  ALBUMIN 1.9* 2.0* 1.9* 2.0* 1.9*    CBG: Recent Labs  Lab 07/04/20 1635 07/04/20 2053 07/05/20 0419 07/05/20 0803 07/05/20 1123  GLUCAP 121* 103* 122* 109* 117*    Recent Results (from the past 240 hour(s))  Respiratory Panel by RT PCR (Flu A&B, Covid) - Nasopharyngeal Swab     Status: None   Collection Time: 06/27/20  9:48 PM   Specimen: Nasopharyngeal Swab  Result Value Ref Range Status   SARS Coronavirus 2 by RT PCR NEGATIVE NEGATIVE Final    Comment: (NOTE) SARS-CoV-2 target nucleic acids are NOT DETECTED.  The SARS-CoV-2 RNA is generally detectable in upper respiratoy specimens during the acute phase of infection. The lowest concentration of SARS-CoV-2 viral copies this assay can detect is 131 copies/mL. A negative result does not preclude SARS-Cov-2 infection and should not be used  as the sole basis for treatment or other patient management decisions. A negative result may occur with  improper specimen collection/handling, submission of specimen other than nasopharyngeal swab, presence of viral mutation(s) within the areas targeted by this assay, and inadequate number of viral copies (<131 copies/mL). A negative result must be combined with clinical observations, patient history, and epidemiological information. The expected result is Negative.  Fact Sheet for Patients:  PinkCheek.be  Fact Sheet for Healthcare Providers:  GravelBags.it  This test is no t yet approved or cleared by the [place] FDA and  has been authorized for detection and/or diagnosis of SARS-CoV-2 by FDA under an Emergency Use Authorization (EUA). This EUA will remain  in effect (meaning this test can be used) for the duration of the COVID-19 declaration under Section 564(b)(1) of the Act, 21 U.S.C. section 360bbb-3(b)(1), unless the authorization is terminated or revoked sooner.     Influenza A by PCR NEGATIVE NEGATIVE Final   Influenza B by PCR NEGATIVE NEGATIVE Final    Comment: (NOTE) The Xpert Xpress SARS-CoV-2/FLU/RSV assay is intended as an aid in  the diagnosis of influenza from Nasopharyngeal swab specimens and  should not be used as a sole basis for treatment. Nasal washings and  aspirates are unacceptable for Xpert Xpress SARS-CoV-2/FLU/RSV  testing.  Fact Sheet for Patients: PinkCheek.be  Fact Sheet for Healthcare Providers: GravelBags.it  This test is not yet approved or cleared by the [place] FDA and  has been authorized for detection and/or diagnosis of SARS-CoV-2 by  FDA under an Emergency Use Authorization (EUA). This EUA will remain  in effect (meaning this test can be used) for the duration of the  Covid-19 declaration under Section  564(b)(1) of the Act, 21  U.S.C. section 360bbb-3(b)(1), unless the authorization is  terminated or revoked. Performed at [facility], [address], [place], [place] [address]   Aerobic/Anaerobic Culture (surgical/deep wound)     Status: None   Collection Time: 06/28/20  2:45 AM   Specimen: PATH Other; Tissue  Result Value Ref Range Status   Specimen Description   Final    PERITONEAL Performed at [facility], [address], [place], [place] [address]    Special Requests   Final    GASTRIC ULCER PREFORATION Performed at [facility], [address] [place], [place] [address]    Gram Stain   Final    RARE WBC PRESENT, PREDOMINANTLY PMN NO ORGANISMS SEEN    Culture   Final    RARE Multiple Species Consistent With Normal Fecal Flora RARE CANDIDA GLABRATA NO ANAEROBES ISOLATED Performed at [facility], [address] [place], [place] [address]    Report Status 07/03/2020 FINAL  Final  Culture, blood (Routine X 2) w Reflex to ID Panel     Status: None   Collection Time: 06/28/20  3:21 PM   Specimen: BLOOD  Result Value Ref Range Status   Specimen Description BLOOD LEFT ANTECUBITAL  Final   Special Requests   Final    BOTTLES DRAWN AEROBIC AND ANAEROBIC Blood Culture adequate volume   Culture   Final    NO GROWTH 5 DAYS Performed at [facility], [address] [place], [place] [address]    Report Status 07/03/2020 FINAL  Final  Culture, blood (Routine X 2) w Reflex to ID Panel     Status: None   Collection Time: 06/28/20  3:21 PM   Specimen: BLOOD LEFT HAND  Result Value Ref Range Status   Specimen Description BLOOD LEFT HAND  Final   Special Requests   Final    BOTTLES DRAWN AEROBIC AND ANAEROBIC Blood Culture adequate volume   Culture   Final    NO GROWTH 5 DAYS Performed at [facility], [address], [place], [place] [address]    Report Status 07/03/2020 FINAL  Final  Culture, Urine     Status: None   Collection Time: 06/28/20  3:27 PM   Specimen:  Urine, Clean Catch  Result Value Ref Range Status   Specimen Description   Final    URINE, CLEAN CATCH Performed at [facility], [address], [place], [place] [address]    Special Requests   Final    NONE Performed at [facility], [address], [place], [place] [address]    Culture   Final    NO GROWTH Performed at [facility], [place] [address], [place], [place] [address]    Report Status 06/30/2020 FINAL  Final     Radiology Studies: No results found. Scheduled Meds: . chlorhexidine gluconate (MEDLINE KIT)  15 mL Mouth Rinse BID  . Chlorhexidine Gluconate Cloth  6 each Topical Daily  . heparin injection (subcutaneous)  5,000 Units Subcutaneous Q8H  . insulin aspart  0-9 Units Subcutaneous Q8H  . mouth rinse  15 mL Mouth Rinse BID  . pantoprazole (PROTONIX) IV  40 mg Intravenous Q12H   Continuous Infusions: . anidulafungin     Followed by  . [START ON 07/06/2020] anidulafungin    . piperacillin-tazobactam (ZOSYN)  IV 3.375 g (07/05/20 0925)  . TPN ADULT (ION) 80 mL/hr at 07/05/20 0300  . TPN ADULT (ION)      LOS: 7 days   Irwin Brakeman, MD  How to contact the [facility] Attending or Consulting provider [place] or covering provider during after hours [place], for this patient?  1. Check the care team in [facility] and look for a) attending/consulting TRH provider listed and b) the [facility] team listed 2. Log into [email or web address] and use [place]'s universal password to access. If you do not have the password, please contact the hospital operator. 3. Locate the [facility] provider you are looking for under [facility] and page to a number that you can be directly reached. 4. If you still have difficulty reaching the provider, please page the [facility] (Director on Call) for the Hospitalists listed on amion for assistance.  07/05/2020, 12:40 PM

## 2020-07-05 NOTE — Progress Notes (Signed)
Physical Therapy Treatment Patient Details Name: Frederick [name] MRN: [id number] DOB: April 08, 1941 Today's Date: 07/05/2020    History of Present Illness Frederick [name] is a 79 y.o. male s/p Exploratory laparotomy, vascularized omental patch on 06/28/20 who does not seek medical attention and has no PCP and takes no medications. He came in with acute onset of abdominal pain in his epigastric area after eating chocolate earlier today. The pain comes in waves and is not associated with any vomiting.    PT Comments    Patient agreeable for therapy and presents with visitor in room.  Patient demonstrates slow labored movement for rolling to side and sitting up from side lying position using bed rail, has difficulty propping up on left elbow due to weakness and abdominal discomfort, demonstrates fair sitting balance while completing exercises, but had to support self with BUE to avoid falling over and limited to a few side steps at bedside due to generalized weakness, fatigue and poor standing balance with flexed trunk.  Patient tolerated sitting up in chair after therapy with visitor in room - RN notified.  Patient will benefit from continued physical therapy in hospital and recommended venue below to increase strength, balance, endurance for safe ADLs and gait.    Follow Up Recommendations  SNF;Supervision for mobility/OOB;Supervision/Assistance - 24 hour     Equipment Recommendations  Rolling walker with 5" wheels    Recommendations for Other Services       Precautions / Restrictions Precautions Precautions: Fall Restrictions Weight Bearing Restrictions: No    Mobility  Bed Mobility Overal bed mobility: Needs Assistance Bed Mobility: Rolling;Sidelying to Sit Rolling: Mod assist Sidelying to sit: Mod assist;Max assist       General bed mobility comments: had to use side rail, HOB flat  Transfers Overall transfer level: Needs assistance Equipment used: Rolling walker (2  wheeled) Transfers: Sit to/from [facility] Sit to Stand: Mod assist Stand pivot transfers: Mod assist       General transfer comment: slow labored movement, flexed trunk  Ambulation/Gait Ambulation/Gait assistance: Mod assist;Max assist Gait Distance (Feet): 7 Feet Assistive device: Rolling walker (2 wheeled) Gait Pattern/deviations: Decreased step length - right;Decreased step length - left;Decreased stride length;Trunk flexed Gait velocity: slow   General Gait Details: limited to unsteady labored side steps at bedside due to BLE weakness, poor trunk control with flexed posture   Stairs             Wheelchair Mobility    Modified Rankin (Stroke Patients Only)       Balance Overall balance assessment: Needs assistance Sitting-balance support: Feet supported;No upper extremity supported Sitting balance-Leahy Scale: Fair Sitting balance - Comments: seated at EOB   Standing balance support: During functional activity;Bilateral upper extremity supported Standing balance-Leahy Scale: Poor Standing balance comment: fair/poor using RW                            Cognition Arousal/Alertness: Awake/alert Behavior During Therapy: WFL for tasks assessed/performed Overall Cognitive Status: Within Functional Limits for tasks assessed                                        Exercises General Exercises - Lower Extremity Long Arc Quad: Seated;AROM;Strengthening;Both;10 reps Hip Flexion/Marching: Seated;AROM;Strengthening;Both;10 reps Toe Raises: Seated;AROM;Strengthening;Both;10 reps Heel Raises: Seated;AROM;Strengthening;Both;10 reps    General Comments  Pertinent Vitals/Pain Pain Assessment: No/denies pain    Home Living                      Prior Function            PT Goals (current goals can now be found in the care plan section) Acute Rehab PT Goals Patient Stated Goal: return home with  family/friends to assist PT Goal Formulation: With patient Time For Goal Achievement: 07/18/20 Potential to Achieve Goals: Good Progress towards PT goals: Progressing toward goals    Frequency    Min 3X/week      PT Plan Current plan remains appropriate    Co-evaluation              AM-PAC PT "6 Clicks" Mobility   Outcome Measure  Help needed turning from your back to your side while in a flat bed without using bedrails?: A Lot Help needed moving from lying on your back to sitting on the side of a flat bed without using bedrails?: A Lot Help needed moving to and from a bed to a chair (including a wheelchair)?: A Lot Help needed standing up from a chair using your arms (e.g., wheelchair or bedside chair)?: A Lot Help needed to walk in hospital room?: A Lot Help needed climbing 3-5 steps with a railing? : Total 6 Click Score: 11    End of Session   Activity Tolerance: Patient tolerated treatment well;Patient limited by fatigue Patient left: in chair;with call bell/phone within reach;with chair alarm set Nurse Communication: Mobility status PT Visit Diagnosis: Unsteadiness on feet (R26.81);Other abnormalities of gait and mobility (R26.89);Muscle weakness (generalized) (M62.81)     Time: 3354-5625 PT Time Calculation (min) (ACUTE ONLY): 27 min  Charges:  $Therapeutic Exercise: 8-22 mins $Therapeutic Activity: 8-22 mins                     4:21 PM, 07/05/20 Lonell Grandchild, MPT Physical Therapist with [facility] [phone] office [phone] mobile phone

## 2020-07-05 NOTE — Progress Notes (Signed)
I was present with the medical student for this service. I personally verified the history of present illness, performed the physical exam, and made the plan for this encounter. I have verified the medical student's documentation and made modifications where appropriately. I have personally documented in my own words a brief history, physical, and plan below.     Doing well and no pain. Drain with some brown drainage but no green. Some suds. Have discussed with radiology and plan for CT with contrast tomorrow to assess for leak. If leak then will need PICC and TPN to continue but if no leak will hopefully start diet and wean off TPN and d/c the CVL.   Cultures intraperitoneal with some candida. Given the continued leukocytosis and continued leak we will treat for now and try to deescalate as we are able too.  CBC with dif for tomorrow  Mouth swabs only. RN to record JP output volume.   Discussed with Dr. Wynetta Emery.  Curlene Labrum, MD [facility] [place], [place] [address] [phone] (office)    [facility] Progress Note  7 Days Post-Op  Subjective: Pt doing well following transition from ICU to floor. Denies pain and reports thirst this AM.   Objective: Vital signs in last 24 hours: Temp:  [98.4 F (36.9 C)-98.7 F (37.1 C)] 98.4 F (36.9 C) (11/02 0600) Pulse Rate:  [67-104] 92 (11/02 0600) Resp:  [15-25] 24 (11/02 0600) BP: (102-165)/(38-119) 163/83 (11/02 0600) SpO2:  [94 %-100 %] 98 % (11/02 0600) Last BM Date: 07/04/20  Intake/Output from previous day: 11/01 0701 - 11/02 0700 In: 1816.8 [I.V.:1672.5; IV Piggyback:144.3] Out: 2300 [Urine:2300] Intake/Output this shift: No intake/output data recorded.   Constitutional: alert, cooperative and no distress  Respiratory: breathing non-labored at rest  Cardiovascular: regular rate and sinus rhythm  Gastrointestinal: soft, non-tender, and non-distended,  JP with minimal bile-tinged serous fluid in bulb w/ minimal suds. Incision:Dressing intact, no erythema no discharge.  Lab Results:  Recent Labs    07/03/20 0523 07/04/20 0507  WBC 14.8* 15.8*  HGB 9.3* 9.2*  HCT 28.5* 27.9*  PLT 323 368   BMET Recent Labs    07/03/20 0523 07/04/20 0507  NA 137 137  K 3.4* 4.0  CL 102 104  CO2 26 24  GLUCOSE 105* 118*  BUN 33* 40*  CREATININE 1.21 1.30*  CALCIUM 8.3* 8.4*   PT/INR No results for input(s): LABPROT, INR in the last 72 hours.  Studies/Results: No results found.  Anti-infectives: Anti-infectives (From admission, onward)   Start     Dose/Rate Route Frequency Ordered Stop   07/04/20 1700  piperacillin-tazobactam (ZOSYN) IVPB 3.375 g        3.375 g 12.5 mL/hr over 240 Minutes Intravenous Every 8 hours 07/04/20 1055     07/01/20 1700  piperacillin-tazobactam (ZOSYN) IVPB 3.375 g  Status:  Discontinued        3.375 g 12.5 mL/hr over 240 Minutes Intravenous Every 8 hours 07/01/20 1248 07/04/20 1055   07/01/20 1400  tetracycline (SUMYCIN) capsule 500 mg  Status:  Discontinued       Note to Pharmacy: Empiric h pylori treatment   500 mg Oral 4 times daily 07/01/20 1203 07/01/20 1236   07/01/20 1400  metroNIDAZOLE (FLAGYL) tablet 250 mg  Status:  Discontinued       Note to Pharmacy: Empiric h pylori treatment   250 mg Oral 4 times daily 07/01/20 1203 07/01/20 1236   06/29/20 1000  piperacillin-tazobactam (  ZOSYN) IVPB 3.375 g  Status:  Discontinued        3.375 g 12.5 mL/hr over 240 Minutes Intravenous Every 8 hours 06/29/20 0907 07/01/20 1205   06/28/20 2200  piperacillin-tazobactam (ZOSYN) IVPB 3.375 g  Status:  Discontinued        3.375 g 12.5 mL/hr over 240 Minutes Intravenous Every 12 hours 06/28/20 1407 06/29/20 0907   06/28/20 0600  piperacillin-tazobactam (ZOSYN) IVPB 3.375 g  Status:  Discontinued        3.375 g 12.5 mL/hr over 240 Minutes Intravenous Every 8 hours 06/28/20 0412 06/28/20 1407   06/28/20 0130   cefoTEtan (CEFOTAN) 2 g in sodium chloride 0.9 % 100 mL IVPB        2 g 200 mL/hr over 30 Minutes Intravenous On call to O.R. 06/27/20 2323 06/28/20 0200   06/27/20 2215  piperacillin-tazobactam (ZOSYN) IVPB 3.375 g        3.375 g 100 mL/hr over 30 Minutes Intravenous  Once 06/27/20 2203 06/27/20 2305      Assessment/Plan: s/p Procedure(s): EXPLORATORY LAPAROTOMY, graham patch   LOS: 7 days    Doing well overall w/ pain controlled, stable vitals, reassuring physical exam, and no labs yet available today. Plans as follows:   NPO TPN for now Will need H pylori treatment once on po empirically PPI Central line in place since 10/26 will continue for now for TPN UGI likely Wednesday PT   Raliegh Ip 07/05/2020

## 2020-07-05 NOTE — Progress Notes (Signed)
PHARMACY - TOTAL PARENTERAL NUTRITION CONSULT NOTE    Indication: gastric perforation  Patient Measurements: Height: 5\' 8"  (172.7 cm) Weight: 66.4 kg (146 lb 6.2 oz) IBW/kg (Calculated) : 68.4 TPN AdjBW (KG): 63.5 Body mass index is 22.26 kg/m. Usual Weight:    Assessment: Per RD note: Inadequate oral intake related to acute illness (gastric ulcer perforation s/p ex-lap with omental patch) as evidenced by NPO status. Continue NPO, UGI likely Wednesday  Glucose / Insulin: 122-126-->  SSI q8h sensitive scale ordered. Electrolytes:    WNL Phos 2.3> 3.5 Renal:  Scr 1.61 > 1.3 LFT:  WNL TG: 129> 101 Prealbumin:  6.9 > 6.0 Albumin: 1.9 Intake / Output:   Intake/Output Summary (Last 24 hours) at 07/05/2020 0846 Last data filed at 07/05/2020 0600 Gross per 24 hour  Intake 1789.89 ml  Output 2300 ml  Net -510.11 ml    GI Imaging:  10/29 UGI found continued leak, so TPN to continue Surgeries / Procedures:   s/p exp laparotomy w/ omental patch for perforated gastric ulcer  Central access:  CVC Triple Lumen placed 06/28/20 -->rt internal jugular TPN start date:  06-30-20 at 1800  Nutritional Goals (per RD recommendation on 06-30-20): kCal: 3818-4037  Protein: 84-95g  Fluid: >1700 mL Goal TPN rate is 80 mL/hr (provides 92 g of protein and ~1827 kcals per day)  Current Nutrition:  NG dislodged  10-29 Plan is to try to advance diet today to clears   Plan:  Continue TPN at 80 mL/hr at 1800  Electrolytes in TPN: 61mEq/L of Na, 76mEq/L of K, 21mEq/L of Ca, 56mEq/L of Mg, and 20 mmol/L of Phos.  Cl:Ac ratio 1:1 Add standard MVI and trace elements to TPN Adjust sliding scale to sensitive q8h and adjust as needed  Monitor TPN labs on Mon/Thurs   Isac Sarna, BS [place], [place] Clinical Pharmacist Pager [phone] 07/05/2020 8:46 AM

## 2020-07-05 NOTE — Progress Notes (Signed)
Patient to be transferred to 335, report given to [facility], accepting RN on 300. Attempted to call patient's contact, Frederick [name], per patient request in order to provide update regarding transfer, no answer. Patient and vitals in stable condition.

## 2020-07-05 NOTE — Progress Notes (Signed)
19ml obtained from JP drain thus far on shift. Drainage brown in color. No odor noted.

## 2020-07-05 NOTE — TOC Progression Note (Signed)
Transition of Care [facility]) - Progression Note    Patient Details  Name: Frederick [name] MRN: [id number] Date of Birth: 26-Jun-1941  [facility] CM/SW Contact  Boneta Lucks, RN Phone Number: 07/05/2020, 4:25 PM  Clinical Narrative:   PT is recommending SNF, Patient states he is going home. Girlfriend at the bedside, they both are agreeing to [facility].  No preference.  Vaughan Basta with [facility] accepted the referral.    Expected Discharge Plan: [place] Barriers to Discharge: Continued Medical Work up  Expected Discharge Plan and Services Expected Discharge Plan: [place] arrangements for the past 2 months: Single Family Home                      Readmission Risk Interventions Readmission Risk Prevention Plan 07/05/2020  Transportation Screening Complete  Home Care Screening Complete  Medication Review (RN CM) Complete

## 2020-07-06 ENCOUNTER — Inpatient Hospital Stay (HOSPITAL_COMMUNITY): Payer: Medicare Other

## 2020-07-06 ENCOUNTER — Inpatient Hospital Stay: Payer: Self-pay

## 2020-07-06 LAB — CBC WITH DIFFERENTIAL/PLATELET
Abs Immature Granulocytes: 0.51 10*3/uL — ABNORMAL HIGH (ref 0.00–0.07)
Basophils Absolute: 0.1 10*3/uL (ref 0.0–0.1)
Basophils Relative: 1 %
Eosinophils Absolute: 0.3 10*3/uL (ref 0.0–0.5)
Eosinophils Relative: 2 %
HCT: 25.3 % — ABNORMAL LOW (ref 39.0–52.0)
Hemoglobin: 8.5 g/dL — ABNORMAL LOW (ref 13.0–17.0)
Immature Granulocytes: 3 %
Lymphocytes Relative: 15 %
Lymphs Abs: 2.6 10*3/uL (ref 0.7–4.0)
MCH: 31.3 pg (ref 26.0–34.0)
MCHC: 33.6 g/dL (ref 30.0–36.0)
MCV: 93 fL (ref 80.0–100.0)
Monocytes Absolute: 0.8 10*3/uL (ref 0.1–1.0)
Monocytes Relative: 4 %
Neutro Abs: 13.3 10*3/uL — ABNORMAL HIGH (ref 1.7–7.7)
Neutrophils Relative %: 75 %
Platelets: 488 10*3/uL — ABNORMAL HIGH (ref 150–400)
RBC: 2.72 MIL/uL — ABNORMAL LOW (ref 4.22–5.81)
RDW: 15.4 % (ref 11.5–15.5)
WBC: 17.5 10*3/uL — ABNORMAL HIGH (ref 4.0–10.5)
nRBC: 0 % (ref 0.0–0.2)

## 2020-07-06 LAB — COMPREHENSIVE METABOLIC PANEL
ALT: 33 U/L (ref 0–44)
AST: 44 U/L — ABNORMAL HIGH (ref 15–41)
Albumin: 1.8 g/dL — ABNORMAL LOW (ref 3.5–5.0)
Alkaline Phosphatase: 160 U/L — ABNORMAL HIGH (ref 38–126)
Anion gap: 9 (ref 5–15)
BUN: 40 mg/dL — ABNORMAL HIGH (ref 8–23)
CO2: 22 mmol/L (ref 22–32)
Calcium: 8.2 mg/dL — ABNORMAL LOW (ref 8.9–10.3)
Chloride: 107 mmol/L (ref 98–111)
Creatinine, Ser: 1.36 mg/dL — ABNORMAL HIGH (ref 0.61–1.24)
GFR, Estimated: 53 mL/min — ABNORMAL LOW (ref >60)
Glucose, Bld: 121 mg/dL — ABNORMAL HIGH (ref 70–99)
Potassium: 3.9 mmol/L (ref 3.5–5.1)
Sodium: 138 mmol/L (ref 135–145)
Total Bilirubin: 0.5 mg/dL (ref 0.3–1.2)
Total Protein: 5.7 g/dL — ABNORMAL LOW (ref 6.5–8.1)

## 2020-07-06 LAB — GLUCOSE, CAPILLARY
Glucose-Capillary: 115 mg/dL — ABNORMAL HIGH (ref 70–99)
Glucose-Capillary: 125 mg/dL — ABNORMAL HIGH (ref 70–99)

## 2020-07-06 LAB — PROTIME-INR
INR: 1.3 — ABNORMAL HIGH (ref 0.8–1.2)
Prothrombin Time: 16.1 seconds — ABNORMAL HIGH (ref 11.4–15.2)

## 2020-07-06 LAB — MAGNESIUM: Magnesium: 2.2 mg/dL (ref 1.7–2.4)

## 2020-07-06 MED ORDER — IOHEXOL 300 MG/ML  SOLN
100.0000 mL | Freq: Once | INTRAMUSCULAR | Status: AC | PRN
  Administered 2020-07-06: 100 mL via INTRAVENOUS

## 2020-07-06 MED ORDER — SODIUM CHLORIDE 0.9% FLUSH: 10.0000 mL | INTRAVENOUS | Status: DC | PRN

## 2020-07-06 MED ORDER — NYSTATIN 100000 UNIT/ML MT SUSP
5.0000 mL | Freq: Four times a day (QID) | OROMUCOSAL | Status: DC
  Administered 2020-07-06 – 2020-07-12 (×21): 500000 [IU] via OROMUCOSAL
  Filled 2020-07-06 (×21): qty 5

## 2020-07-06 MED ORDER — SODIUM CHLORIDE 0.9% FLUSH
10.0000 mL | Freq: Two times a day (BID) | INTRAVENOUS | Status: DC
  Administered 2020-07-06 – 2020-07-12 (×11): 10 mL

## 2020-07-06 MED ORDER — TRAVASOL 10 % IV SOLN
INTRAVENOUS | Status: AC
  Filled 2020-07-06: qty 921.6

## 2020-07-06 NOTE — Progress Notes (Addendum)
IR consulted by Dr. Constance Haw for possible image-guided intra-abdominal fluid collection aspiration with possible drain placement.  Case/images have been reviewed by Dr. Laurence Ferrari who approves procedure. Plan for image-guided intra-abdominal fluid collection aspiration with possible drain placement in [facility] IR tentatively for tomorrow 07/07/2020. Patient to be transferred to and from [facility] for procedure, arrive at 0900 for 1000 procedure. Velva Harman, RN aware to set up [facility] for this. Patient will be NPO at midnight. Heparin held at midnight. INR 1.3 today. Consent obtained by patient's legal guardian, Iran Planas, via telephone- signed and in [facility] IR control room. Patient will be seen for procedure tomorrow AM prior to procedure.  Please call IR with questions/concerns.   ADDENDUM: Was just notified by Velva Harman, RN of possible transportation issues tomorrow with [facility]. At this time, will leave patient tentatively scheduled for IR procedure at [facility] 07/07/2020. Will re-assess transportation tomorrow AM. Please  call IR with questions/concerns.   Bea Graff Arthella Headings, PA-C 07/06/2020, 3:31 PM

## 2020-07-06 NOTE — TOC Progression Note (Signed)
Transition of Care [facility]) - Progression Note    Patient Details  Name: Frederick [name] MRN: [id number] Date of Birth: 01/05/1941  Transition of Care [facility]) CM/SW Contact  Boneta Lucks, RN Phone Number: 07/06/2020, 3:40 PM  Clinical Narrative:   TOC spoke with patient again today, he continues to refuse SNF. Patient understands he is now requiring TPN at home.  He states his girlfriend, Faythe Dingwall, or daughter Theadora Rama will assist.  [place] daughter called to say Theadora Rama will  Be here Friday. They were unaware that Suanne Marker( neighbor) sign health care POA with patient yesterday. Suanne Marker has been handling his finances, and insurance. Family has some concerns and Theadora Rama will discuss with her father.   Referral made to [facility] for TPN. She was made aware that patient only has Medicare A.    Expected Discharge Plan: [place] Barriers to Discharge: Continued Medical Work up  Expected Discharge Plan and Services Expected Discharge Plan: [place] arrangements for the past 2 months: Single Family Home                  Readmission Risk Interventions Readmission Risk Prevention Plan 07/05/2020  Transportation Screening Complete  Home Care Screening Complete  Medication Review (RN CM) Complete

## 2020-07-06 NOTE — Progress Notes (Signed)
PHARMACY - TOTAL PARENTERAL NUTRITION CONSULT NOTE    Indication: gastric perforation  Patient Measurements: Height: 5\' 8"  (172.7 cm) Weight: 66.4 kg (146 lb 6.2 oz) IBW/kg (Calculated) : 68.4 TPN AdjBW (KG): 63.5 Body mass index is 22.26 kg/m. Usual Weight:    Assessment: Per RD note: Inadequate oral intake related to acute illness (gastric ulcer perforation s/p ex-lap with omental patch) as evidenced by NPO status. Continue NPO, UGI likely Wednesday  Glucose / Insulin: 115-125-->  SSI q8h sensitive scale ordered. 1 unit in 24 hrs  Electrolytes:    WNL  Renal:  Scr 1.36 LFT:  WNL TG: 101 Prealbumin:  6.0 Albumin: 1.8 Intake / Output:   Intake/Output Summary (Last 24 hours) at 07/06/2020 2919 Last data filed at 07/06/2020 0421 Gross per 24 hour  Intake 1780.02 ml  Output 1100 ml  Net 680.02 ml    GI Imaging:  10/29 UGI found continued leak, so TPN to continue Surgeries / Procedures:   s/p exp laparotomy w/ omental patch for perforated gastric ulcer  Central access:  CVC Triple Lumen placed 06/28/20 -->rt internal jugular TPN start date:  06-30-20 at 1800  Nutritional Goals (per RD recommendation on 06-30-20): kCal: 1660-6004  Protein: 84-95g  Fluid: >1700 mL Goal TPN rate is 80 mL/hr (provides 92 g of protein and ~1827 kcals per day)  Current Nutrition:  NG dislodged  10-29 Plan is to try to advance diet today to clears   Plan:  Continue TPN at 80 mL/hr at 1800  Electrolytes in TPN: 35mEq/L of Na, 23mEq/L of K, 50mEq/L of Ca, 27mEq/L of Mg, and 20 mmol/L of Phos.  Cl:Ac ratio 1:1 Add standard MVI and trace elements to TPN sliding scale to sensitive q8h and adjust as needed  Monitor TPN labs on Mon/Thurs   Margot Ables, PharmD Clinical Pharmacist 07/06/2020 8:27 AM

## 2020-07-06 NOTE — Progress Notes (Signed)
Informed Alexandra-PA at [facility] that [facility] is booked until 1500 tomorrow and EMS is not able to take patient due to patient having Central line.  Awaiting call back from [facility] for further proceeding.

## 2020-07-06 NOTE — Progress Notes (Signed)
IJ CVC removed successfully. No complications. Dressing placed.

## 2020-07-06 NOTE — Progress Notes (Signed)
Peripherally Inserted Central Catheter Placement  The IV Nurse has discussed with the patient and/or persons authorized to consent for the patient, the purpose of this procedure and the potential benefits and risks involved with this procedure.  The benefits include less needle sticks, lab draws from the catheter, and the patient may be discharged home with the catheter. Risks include, but not limited to, infection, bleeding, blood clot (thrombus formation), and puncture of an artery; nerve damage and irregular heartbeat and possibility to perform a PICC exchange if needed/ordered by physician.  Alternatives to this procedure were also discussed.  Bard Power PICC patient education guide, fact sheet on infection prevention and patient information card has been provided to patient /or left at bedside.    PICC Placement Documentation  PICC Single Lumen 07/06/20 PICC Right Brachial 49 cm 0 cm (Active)  Indication for Insertion or Continuance of Line Administration of hyperosmolar/irritating solutions (i.e. TPN, Vancomycin, etc.) 07/06/20 1815  Exposed Catheter (cm) 0 cm 07/06/20 1815  Site Assessment Clean;Intact;Dry 07/06/20 1815  Line Status Blood return noted;Flushed;Saline locked 07/06/20 1815  Dressing Type Transparent 07/06/20 1815  Dressing Status Clean;Dry;Intact 07/06/20 1815  Antimicrobial disc in place? Yes 07/06/20 1815  Safety Lock Not Applicable 11/00/34 9611  Line Care Connections checked and tightened 07/06/20 1815  Dressing Change Due 07/13/20 07/06/20 1815       Lynne Logan 07/06/2020, 7:20 PM

## 2020-07-06 NOTE — Progress Notes (Signed)
PROGRESS NOTE   Frederick [name]  IRJ:[id number] DOB: 1941/07/18 DOA: 06/27/2020 PCP: Patient, No Pcp Per   Chief Complaint  Patient presents with  . Abdominal Pain   Brief Admission History:  79 y.o. male with no documented chronic medical problems presenting with 2-week history of abdominal pain that was intermittent in nature, occasionally worse with food.  The patient has not seen a physician for nearly 5 years.  He does not take any prescription medications.  Apparently, the patient ate some chocolate on 06/27/2020 after which he had unrelenting epigastric abdominal pain.  There was some nausea without any emesis.  The patient denies any NSAIDs.  Because of his abdominal pain, the patient presented for further evaluation.  He had denied any chest pain, shortness breath, diarrhea, dysuria.  In the emergency department, the patient was afebrile with soft blood pressures.  CT of the abdomen and pelvis showed moderate free air in the upper abdomen with a small amount of free fluid consistent with perforated viscus.  It was suspected that this was likely in the prepyloric region of the stomach.  General surgery was consulted.  The patient was taken to the operating room and underwent an exploratory laparotomy with vascular omental patch placement.  Unfortunately, there was difficulty extubating the patient postoperatively.  On the morning of 06/28/2020, the patient began developing hypotension.  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    Assessment & Plan:   Principal Problem:   Gastric perforation, acute Active Problems:   Gastric perforation (HCC)   Acute respiratory failure with hypoxia (HCC)   Hyperkalemia   AKI (acute kidney injury) ([place])   Complete heart block (HCC)   Hypotension   Endotracheally intubated   Perforated abdominal viscus   1)Gastric ulcer perforation-- s/p Exploratory laparotomy, vascularized omental patch  by Dr. Constance Haw on 06/28/2020 .  IV  protonix 40 mg every 12 hours.     -Repeat CT abdomen and pelvis from 07/06/2020 reviewed with radiology. Small leak remains patient continues to have bilious output from his JP drain, getting smaller and some gas in area and LUQ collection. Leukocytosis going up so -Plan for image-guided intra-abdominal fluid collection aspiration with possible drain placement in [facility] IR tentatively on 07/07/2020 at [facility].   2)Septic Shock.  Hypotension - resolved now.  He was briefly on dobutamine infusion which is now discontinued.  3)Candida Glabrata growing in peritoneal fluid - anidulafungin IV started 07/05/20.   4)Hypertension - IV hydralazine ordered.   5)Hypokalemia / Hypophos - IV replacement, recheck   6)AV block - Appreciate cardiology consultation. Follow for now.  Avoiding beta blockers.  Arrange home cardiac monitor at discharge.    7)Stage 4 CKD - creatinine improved with IV fluids and supportive measures.  \  8)Acute delirium - haldol IV prn.  Delirium precautions.   9)FEN--patient remains n.p.o. , continue TPN (started 10/28/210, IV zosyn and IV antifungal   DVT prophylaxis: sQ heparin  Code Status:  Full  Family Communication:  Faythe Dingwall updated at bedside 11/1 Disposition: TBD   Status is: Inpatient  Remains inpatient appropriate because:Hemodynamically unstable, Persistent severe electrolyte disturbances, IV treatments appropriate due to intensity of illness or inability to take PO and Inpatient level of care appropriate due to severity of illness.  Pt remains on TPN.  Plan for image-guided intra-abdominal fluid collection aspiration with possible drain placement in [facility] IR tentatively on 07/07/2020 at [facility]. Dispo: The patient is from: Home  Anticipated d/c is to: TBD              Anticipated d/c date is: > 3 days              Patient currently is not medically stable to d/c.  Consultants:   Cardiology  pccm  Surgery   Procedures:   Central line placement  10/26  Exp lap with omental patch 10/26  Antimicrobials:  Zosyn 10/26>> anidulafungin 11/2>>  Subjective: -Patient is disappointed that he have to continue TPN and unable to take oral intake   Objective: Vitals:   07/05/20 0600 07/05/20 1000 07/05/20 1450 07/05/20 2119  BP: (!) 163/83 (!) 154/72 136/74 (!) 151/90  Pulse: 92 76 78 66  Resp: (!) 24 20 16 20   Temp: 98.4 F (36.9 C) 98.4 F (36.9 C) 99.8 F (37.7 C) 98.3 F (36.8 C)  TempSrc: Oral Oral Oral Oral  SpO2: 98% 98% 98% 97%  Weight:      Height:        Intake/Output Summary (Last 24 hours) at 07/06/2020 1802 Last data filed at 07/06/2020 1635 Gross per 24 hour  Intake 2117.27 ml  Output 1100 ml  Net 1017.27 ml   Filed Weights   06/29/20 1700 07/02/20 0439 07/03/20 0400  Weight: 70.1 kg 70.5 kg 66.4 kg   Examination:  General exam: elderly chronically ill appearing male, awake, alert, NAD, less agitated today.   Respiratory system: Clear to auscultation. Respiratory effort normal. No crackles or wheezes heard.  Cardiovascular system: normal S1 & S2 heard. No JVD, murmurs, rubs, gallops or clicks. No pedal edema. Gastrointestinal system: Abdomen is nondistended, soft and   wound clean and dry and intact.  hypoactive BS. JP drain in place.  Central nervous system: Alert and oriented to person only. No focal neurological deficits. Extremities: Symmetric 5 x 5 power. Skin: No rashes, lesions or ulcers Psychiatry: intermittently agitated.   Data Reviewed: I have personally reviewed following labs and imaging studies  CBC: Recent Labs  Lab 07/01/20 0412 07/02/20 0521 07/03/20 0523 07/04/20 0507 07/06/20 0425  WBC 14.0* 12.8* 14.8* 15.8* 17.5*  NEUTROABS 12.0* 10.0* 10.6* 10.7* 13.3*  HGB 9.1* 8.9* 9.3* 9.2* 8.5*  HCT 28.7* 27.0* 28.5* 27.9* 25.3*  MCV 97.3 95.7 94.4 92.4 93.0  PLT 284 277 323 368 676*   Basic Metabolic Panel: Recent Labs  Lab 06/30/20 0447 06/30/20 0447 07/01/20 0412 07/02/20 0521  07/03/20 0523 07/04/20 0507 07/05/20 0438 07/06/20 0425  NA 140   < > 141 141 137 137  --  138  K 3.9   < > 3.7 3.4* 3.4* 4.0  --  3.9  CL 105   < > 105 105 102 104  --  107  CO2 26   < > 27 27 26 24   --  22  GLUCOSE 107*   < > 132* 124* 105* 118*  --  121*  BUN 43*   < > 33* 31* 33* 40*  --  40*  CREATININE 1.82*   < > 1.61* 1.39* 1.21 1.30*  --  1.36*  CALCIUM 7.8*   < > 8.1* 8.2* 8.3* 8.4*  --  8.2*  MG 2.3  --  2.2 2.0  --  2.0  --  2.2  PHOS 3.1   < > 2.4* 2.3* 3.4 3.8 3.5  --    < > = values in this interval not displayed.   GFR: Estimated Creatinine Clearance: 40.7 mL/min (A) (by C-G formula based  on SCr of 1.36 mg/dL (H)).  Liver Function Tests: Recent Labs  Lab 07/01/20 0412 07/02/20 0521 07/03/20 0523 07/04/20 0507 07/06/20 0425  AST 21 22 20 20  44*  ALT 17 16 17 16  33  ALKPHOS 63 97 95 99 160*  BILITOT 0.5 0.8 0.6 0.9 0.5  PROT 5.4* 5.2* 5.5* 5.5* 5.7*  ALBUMIN 2.0* 1.9* 2.0* 1.9* 1.8*    CBG: Recent Labs  Lab 07/05/20 1123 07/05/20 1659 07/05/20 2119 07/06/20 0001 07/06/20 0749  GLUCAP 117* 125* 122* 115* 125*    Recent Results (from the past 240 hour(s))  Respiratory Panel by RT PCR (Flu A&B, Covid) - Nasopharyngeal Swab     Status: None   Collection Time: 06/27/20  9:48 PM   Specimen: Nasopharyngeal Swab  Result Value Ref Range Status   SARS Coronavirus 2 by RT PCR NEGATIVE NEGATIVE Final    Comment: (NOTE) SARS-CoV-2 target nucleic acids are NOT DETECTED.  The SARS-CoV-2 RNA is generally detectable in upper respiratoy specimens during the acute phase of infection. The lowest concentration of SARS-CoV-2 viral copies this assay can detect is 131 copies/mL. A negative result does not preclude SARS-Cov-2 infection and should not be used as the sole basis for treatment or other patient management decisions. A negative result may occur with  improper specimen collection/handling, submission of specimen other than nasopharyngeal swab, presence of  viral mutation(s) within the areas targeted by this assay, and inadequate number of viral copies (<131 copies/mL). A negative result must be combined with clinical observations, patient history, and epidemiological information. The expected result is Negative.  Fact Sheet for Patients:  PinkCheek.be  Fact Sheet for Healthcare Providers:  GravelBags.it  This test is no t yet approved or cleared by the [place] FDA and  has been authorized for detection and/or diagnosis of SARS-CoV-2 by FDA under an Emergency Use Authorization (EUA). This EUA will remain  in effect (meaning this test can be used) for the duration of the COVID-19 declaration under Section 564(b)(1) of the Act, 21 U.S.C. section 360bbb-3(b)(1), unless the authorization is terminated or revoked sooner.     Influenza A by PCR NEGATIVE NEGATIVE Final   Influenza B by PCR NEGATIVE NEGATIVE Final    Comment: (NOTE) The Xpert Xpress SARS-CoV-2/FLU/RSV assay is intended as an aid in  the diagnosis of influenza from Nasopharyngeal swab specimens and  should not be used as a sole basis for treatment. Nasal washings and  aspirates are unacceptable for Xpert Xpress SARS-CoV-2/FLU/RSV  testing.  Fact Sheet for Patients: PinkCheek.be  Fact Sheet for Healthcare Providers: GravelBags.it  This test is not yet approved or cleared by the [place] FDA and  has been authorized for detection and/or diagnosis of SARS-CoV-2 by  FDA under an Emergency Use Authorization (EUA). This EUA will remain  in effect (meaning this test can be used) for the duration of the  Covid-19 declaration under Section 564(b)(1) of the Act, 21  U.S.C. section 360bbb-3(b)(1), unless the authorization is  terminated or revoked. Performed at [facility], [address] [place], [place] [address]   Aerobic/Anaerobic Culture  (surgical/deep wound)     Status: None   Collection Time: 06/28/20  2:45 AM   Specimen: PATH Other; Tissue  Result Value Ref Range Status   Specimen Description   Final    PERITONEAL Performed at [facility], [address] [place], [place] [address]    Special Requests   Final    GASTRIC ULCER PREFORATION Performed at [facility], [address] [place], [place] [address]    Gram Stain   Final    RARE WBC PRESENT, PREDOMINANTLY PMN NO ORGANISMS SEEN    Culture   Final    RARE Multiple Species Consistent With Normal Fecal Flora RARE CANDIDA GLABRATA NO ANAEROBES ISOLATED Performed at [facility], [place] [address], [place], [place] [address]    Report Status 07/03/2020 FINAL  Final  Culture, blood (Routine X 2) w Reflex to ID Panel     Status: None   Collection Time: 06/28/20  3:21 PM   Specimen: BLOOD  Result Value Ref Range Status   Specimen Description BLOOD LEFT ANTECUBITAL  Final   Special Requests   Final    BOTTLES DRAWN AEROBIC AND ANAEROBIC Blood Culture adequate volume   Culture   Final    NO GROWTH 5 DAYS Performed at [facility], [address], [place], [place] [address]    Report Status 07/03/2020 FINAL  Final  Culture, blood (Routine X 2) w Reflex to ID Panel     Status: None   Collection Time: 06/28/20  3:21 PM   Specimen: BLOOD LEFT HAND  Result Value Ref Range Status   Specimen Description BLOOD LEFT HAND  Final   Special Requests   Final    BOTTLES DRAWN AEROBIC AND ANAEROBIC Blood Culture adequate volume   Culture   Final    NO GROWTH 5 DAYS Performed at [facility], [address], [place], [place] [address]    Report Status 07/03/2020 FINAL  Final  Culture, Urine     Status: None   Collection Time: 06/28/20  3:27 PM   Specimen: Urine, Clean Catch  Result Value Ref Range Status   Specimen Description   Final    URINE, CLEAN CATCH Performed at [facility], [address] [place], [place] [address]    Special Requests   Final     NONE Performed at [facility], [address], [place], [place] [address]    Culture   Final    NO GROWTH Performed at [facility], [place] [address] [place], [place] [address]    Report Status 06/30/2020 FINAL  Final     Radiology Studies: CT ABDOMEN PELVIS W CONTRAST  Result Date: 07/06/2020 CLINICAL DATA:  Perforated gastric ulcer post Phillip Heal patch, leak on prior exam, follow-up; leukocytosis EXAM: CT ABDOMEN AND PELVIS WITH CONTRAST TECHNIQUE: Multidetector CT imaging of the abdomen and pelvis was performed using the standard protocol following bolus administration of intravenous contrast. Sagittal and coronal MPR images reconstructed from axial data set. CONTRAST:  132m OMNIPAQUE IOHEXOL 300 MG/ML SOLN IV. Dilute oral contrast COMPARISON:  07/01/2020 FINDINGS: Lower chest: Small bibasilar pleural effusions and minimal compressive atelectasis of the lower lobes Hepatobiliary: Liver normal appearance. Minimal gallbladder wall thickening. No biliary dilatation. Pancreas: Normal appearance Spleen: Normal appearance Adrenals/Urinary Tract: Adrenal glands normal appearance. BILATERAL renal cysts, largest medial aspect inferior LEFT kidney 4.6 x 3.7 cm image 36. No hydronephrosis or ureteral dilatation. Minimal bladder wall thickening which may related outlet obstruction. Stomach/Bowel: Wall thickening of lesser curve and antrum of stomach. Tiny focus of extraluminal high attenuation supra posterior to the gastric antrum consistent with extravasated contrast. Small foci of gas adjacent to distal stomach and the distal aspect of the JP drain, could be related to gastric perforation or the drain. No additional extraluminal contrast collections. Small duodenal diverticula. Sigmoid diverticulosis. Remaining bowel loops unremarkable. Vascular/Lymphatic: Atherosclerotic calcifications aorta, iliac arteries, and femoral  arteries without aneurysm. No adenopathy. Reproductive: Mild prostatic enlargement  gland measuring 5.0 x 3.2 cm. Other: Infiltration of fat planes and upper abdomen related to recent surgery. Focal fluid collection identified lateral to the spleen, 9.8 x 3.5 x 8.9 cm, increased in size and demonstrating minimal marginal enhancement cannot exclude abscess. Tiny collection of fluid adjacent to spleen anteriorly 1.3 x 1.6 cm, may communicate with the larger collection listed above. Tiny collection of fluid anterior to the mid stomach 4.1 x 1.0 cm image 29. No additional fluid collections. No free air. No hernia. Musculoskeletal: Osseous demineralization with degenerative disc disease changes of thoracolumbar spine. IMPRESSION: Tiny focus of extraluminal high attenuation superoposterior to the gastric antrum consistent with extravasated contrast and minimal leak from the site of gastric ulcer repair. Small foci of gas adjacent to the distal stomach and the distal aspect of the JP-drain could be related to leak or JP drain. Interval increase in size of a fluid collection lateral to the spleen, 9.8 x 3.5 x 8.9 cm, with minimal marginal enhancement cannot exclude abscess, questionably communicating with an adjacent 1.6 cm diameter collection. Tiny nonspecific collection of fluid anterior to the mid stomach 4.1 x 1.0 cm. Small bibasilar pleural effusions and minimal compressive atelectasis of the lower lobes. Sigmoid diverticulosis without evidence of diverticulitis. Mild prostatic enlargement with wall thickening of bladder question muscular hypertrophy due to chronic outlet obstruction. Aortic Atherosclerosis (ICD10-I70.0). Electronically Signed   By: Lavonia Dana M.D.   On: 07/06/2020 10:33   [place] EKG SITE RITE  Result Date: 07/06/2020 If Site Rite image not attached, placement could not be confirmed due to current cardiac rhythm.  Scheduled Meds: . chlorhexidine gluconate (MEDLINE KIT)  15 mL Mouth Rinse BID  . Chlorhexidine Gluconate Cloth  6 each Topical Daily  . heparin injection  (subcutaneous)  5,000 Units Subcutaneous Q8H  . insulin aspart  0-9 Units Subcutaneous Q8H  . mouth rinse  15 mL Mouth Rinse BID  . nystatin  5 mL Mouth/Throat QID  . pantoprazole (PROTONIX) IV  40 mg Intravenous Q12H   Continuous Infusions: . anidulafungin 100 mg (07/06/20 1454)  . piperacillin-tazobactam (ZOSYN)  IV 3.375 g (07/06/20 1022)  . TPN ADULT (ION)      LOS: 8 days   Roxan Hockey, MD.  07/06/2020, 6:02 PM

## 2020-07-06 NOTE — Progress Notes (Signed)
Informed Inez Catalina , Network engineer, to assist with setting up transport for patient to go to procedure tomorrow at [facility]. Patient is to arrive at facility by 0900 for the 1000 procedure.

## 2020-07-06 NOTE — Progress Notes (Signed)
Physical Therapy Treatment Patient Details Name: Frederick [name] MRN: [id number] DOB: [birth year] Today's Date: 07/06/2020    History of Present Illness Frederick [name] is a 79 y.o. male s/p Exploratory laparotomy, vascularized omental patch on 06/28/20 who does not seek medical attention and has no PCP and takes no medications. He came in with acute onset of abdominal pain in his epigastric area after eating chocolate earlier today. The pain comes in waves and is not associated with any vomiting.    PT Comments    Patient demonstrates improvement for sitting up at bedside without having to roll to side, has to use bed rails and requires frequent verbal/tactile cueing to use BUE to help scoot self forward instead of attempting to pull self forward with bed rails.  Patient limited to a few steps at bedside with flexed trunk, difficulty advancing legs due to weakness and tolerated sitting up in chair after therapy.  Patient will benefit from continued physical therapy in hospital and recommended venue below to increase strength, balance, endurance for safe ADLs and gait.    Follow Up Recommendations  SNF;Supervision for mobility/OOB;Supervision/Assistance - 24 hour     Equipment Recommendations  Rolling walker with 5" wheels    Recommendations for Other Services       Precautions / Restrictions Precautions Precautions: Fall Restrictions Weight Bearing Restrictions: No    Mobility  Bed Mobility Overal bed mobility: Needs Assistance Bed Mobility: Supine to Sit     Supine to sit: HOB elevated;Min assist     General bed mobility comments: demonstrates improvement for sitting up, but requires frequent verbal/tactile cue to use hands on bed instead of using bed rails  Transfers Overall transfer level: Needs assistance Equipment used: Rolling walker (2 wheeled) Transfers: Sit to/from [facility] Sit to Stand: Mod assist;Min assist Stand pivot transfers: Mod assist        General transfer comment: increased BLE strength for completing sit to stands  Ambulation/Gait Ambulation/Gait assistance: Mod assist;Max assist Gait Distance (Feet): 8 Feet Assistive device: Rolling walker (2 wheeled) Gait Pattern/deviations: Decreased step length - right;Decreased step length - left;Decreased stride length;Trunk flexed Gait velocity: slow   General Gait Details: slight improvement for balancing self using RW, continues to be unsteady and limited to side steps at bedside   Stairs             Wheelchair Mobility    Modified Rankin (Stroke Patients Only)       Balance Overall balance assessment: Needs assistance Sitting-balance support: Feet supported;No upper extremity supported Sitting balance-Leahy Scale: Fair Sitting balance - Comments: seated at EOB   Standing balance support: During functional activity;Bilateral upper extremity supported Standing balance-Leahy Scale: Poor Standing balance comment: fair/poor using RW                            Cognition Arousal/Alertness: Awake/alert Behavior During Therapy: WFL for tasks assessed/performed Overall Cognitive Status: Within Functional Limits for tasks assessed                                        Exercises General Exercises - Lower Extremity Long Arc Quad: Seated;AROM;Strengthening;Both;10 reps Hip Flexion/Marching: Seated;AROM;Strengthening;Both;10 reps Toe Raises: Seated;AROM;Strengthening;Both;10 reps Heel Raises: Seated;AROM;Strengthening;Both;10 reps    General Comments        Pertinent Vitals/Pain Pain Assessment: No/denies pain    Home Living  Prior Function            PT Goals (current goals can now be found in the care plan section) Acute Rehab PT Goals Patient Stated Goal: return home with family/friends to assist PT Goal Formulation: With patient Time For Goal Achievement: 07/18/20 Potential to Achieve  Goals: Good Progress towards PT goals: Progressing toward goals    Frequency    Min 3X/week      PT Plan Current plan remains appropriate    Co-evaluation              AM-PAC PT "6 Clicks" Mobility   Outcome Measure  Help needed turning from your back to your side while in a flat bed without using bedrails?: A Lot Help needed moving from lying on your back to sitting on the side of a flat bed without using bedrails?: A Lot Help needed moving to and from a bed to a chair (including a wheelchair)?: A Lot Help needed standing up from a chair using your arms (e.g., wheelchair or bedside chair)?: A Lot Help needed to walk in hospital room?: A Lot Help needed climbing 3-5 steps with a railing? : Total 6 Click Score: 11    End of Session   Activity Tolerance: Patient tolerated treatment well;Patient limited by fatigue Patient left: in chair;with call bell/phone within reach;with chair alarm set Nurse Communication: Mobility status PT Visit Diagnosis: Unsteadiness on feet (R26.81);Other abnormalities of gait and mobility (R26.89);Muscle weakness (generalized) (M62.81)     Time: 8299-3716 PT Time Calculation (min) (ACUTE ONLY): 32 min  Charges:  $Therapeutic Exercise: 8-22 mins $Therapeutic Activity: 8-22 mins                     1:50 PM, 07/06/20 Frederick [name], MPT Physical Therapist with [facility] [phone] office [phone] mobile phone

## 2020-07-06 NOTE — Progress Notes (Addendum)
I was present with the medical student for this service. I personally verified the history of present illness, performed the physical exam, and made the plan for this encounter. I have verified the medical student's documentation and made modifications where appropriately. I have personally documented in my own words a brief history, physical, and plan below.      CT reviewed with radiology. Small leak remains, getting smaller and some gas in area and LUQ collection. Leukocytosis going up.   NPO PICC and remove CVL IR for LUQ collection INR ordered CT in 1 week Dressing removed and drain stripped, minimal brown fluid in drain, incision c/d/i without erythema or drainage  Will need to continue TPN, IV zosyn and IV antifungal. He has thrush so added swish and shallow.   Rhonda notified of everything and the plan. Notified patient that SNF versus HH and he is very adamant about no SNF.   Eating and drinking are not an option as patient request and that he would get sick and pass possibly from a continued leak if he eats.   Curlene Labrum, MD [facility] [place], [place] [phone] (office)      [facility] Progress Note  8 Days Post-Op  Subjective: Pt reports no nausea, vomiting or pain this AM. No ON events. Reports he is very sleepy this morning.  Objective: Vital signs in last 24 hours: Temp:  [98.3 F (36.8 C)-99.8 F (37.7 C)] 98.3 F (36.8 C) (11/02 2119) Pulse Rate:  [66-78] 66 (11/02 2119) Resp:  [16-20] 20 (11/02 2119) BP: (136-154)/(72-90) 151/90 (11/02 2119) SpO2:  [97 %-98 %] 97 % (11/02 2119) Last BM Date: 07/04/20  Intake/Output from previous day: 11/02 0701 - 11/03 0700 In: 1780 [I.V.:1463.7; IV Piggyback:291.4] Out: 1100 [Urine:1100] Intake/Output this shift: No intake/output data recorded.  Constitutional: sleepy, cooperative Respiratory: breathing non-labored at rest   Cardiovascular: regular rate and sinus rhythm  Gastrointestinal: soft, non-tender, and non-distended, JP with minimal bile-tinged serous fluid in bulb w/ minimal suds. Incision:Dressing intact, no erythema no discharge.  Lab Results:  Recent Labs    07/04/20 0507 07/06/20 0425  WBC 15.8* 17.5*  HGB 9.2* 8.5*  HCT 27.9* 25.3*  PLT 368 488*   BMET Recent Labs    07/04/20 0507 07/06/20 0425  NA 137 138  K 4.0 3.9  CL 104 107  CO2 24 22  GLUCOSE 118* 121*  BUN 40* 40*  CREATININE 1.30* 1.36*  CALCIUM 8.4* 8.2*   PT/INR No results for input(s): LABPROT, INR in the last 72 hours.  Studies/Results: No results found.  Anti-infectives: Anti-infectives (From admission, onward)   Start     Dose/Rate Route Frequency Ordered Stop   07/06/20 1015  anidulafungin (ERAXIS) 100 mg in sodium chloride 0.9 % 100 mL IVPB       "Followed by" Linked Group Details   100 mg 78 mL/hr over 100 Minutes Intravenous Every 24 hours 07/05/20 0922     07/05/20 1015  anidulafungin (ERAXIS) 200 mg in sodium chloride 0.9 % 200 mL IVPB       "Followed by" Linked Group Details   200 mg 78 mL/hr over 200 Minutes Intravenous  Once 07/05/20 0922 07/05/20 1659   07/04/20 1700  piperacillin-tazobactam (ZOSYN) IVPB 3.375 g        3.375 g 12.5 mL/hr over 240 Minutes Intravenous Every 8 hours 07/04/20 1055     07/01/20 1700  piperacillin-tazobactam (ZOSYN) IVPB 3.375 g  Status:  Discontinued        3.375 g 12.5 mL/hr over 240 Minutes Intravenous Every 8 hours 07/01/20 1248 07/04/20 1055   07/01/20 1400  tetracycline (SUMYCIN) capsule 500 mg  Status:  Discontinued       Note to Pharmacy: Empiric h pylori treatment   500 mg Oral 4 times daily 07/01/20 1203 07/01/20 1236   07/01/20 1400  metroNIDAZOLE (FLAGYL) tablet 250 mg  Status:  Discontinued       Note to Pharmacy: Empiric h pylori treatment   250 mg Oral 4 times daily 07/01/20 1203 07/01/20 1236   06/29/20 1000  piperacillin-tazobactam (ZOSYN)  IVPB 3.375 g  Status:  Discontinued        3.375 g 12.5 mL/hr over 240 Minutes Intravenous Every 8 hours 06/29/20 0907 07/01/20 1205   06/28/20 2200  piperacillin-tazobactam (ZOSYN) IVPB 3.375 g  Status:  Discontinued        3.375 g 12.5 mL/hr over 240 Minutes Intravenous Every 12 hours 06/28/20 1407 06/29/20 0907   06/28/20 0600  piperacillin-tazobactam (ZOSYN) IVPB 3.375 g  Status:  Discontinued        3.375 g 12.5 mL/hr over 240 Minutes Intravenous Every 8 hours 06/28/20 0412 06/28/20 1407   06/28/20 0130  cefoTEtan (CEFOTAN) 2 g in sodium chloride 0.9 % 100 mL IVPB        2 g 200 mL/hr over 30 Minutes Intravenous On call to O.R. 06/27/20 2323 06/28/20 0200   06/27/20 2215  piperacillin-tazobactam (ZOSYN) IVPB 3.375 g        3.375 g 100 mL/hr over 30 Minutes Intravenous  Once 06/27/20 2203 06/27/20 2305      Assessment/Plan: s/p Procedure(s): EXPLORATORY LAPAROTOMY, graham patch  LOS: 8 days   Pt is on post-op day 8 s/p exploratory laparotomy w/ graham patch for perforated gastric ulcer w/ stable clinical presentation and PE, labs significant for WBC elevated to 17.5 and CT supportive of extravasated contrast at the gastric antrum, minimal leak from the site of gastric ulcer repair, and fluid collection lateral to the spleen.   Plan as follows: - NPO, continue TPN - D/c central line in favor of PICC line  - Consult IR for possible drainage of LUQ collection - Continue PT, home health vs SNFF anticipated at d/c - CT 1 week - Trend labs - Continue anti-fungal given rare candida found in peritoneal fluid intra-op - H. Pylori treatment once on PO meds  Raliegh Ip 07/06/2020

## 2020-07-07 ENCOUNTER — Inpatient Hospital Stay (HOSPITAL_COMMUNITY): Payer: Medicare Other

## 2020-07-07 ENCOUNTER — Ambulatory Visit (HOSPITAL_COMMUNITY)
Admit: 2020-07-07 | Discharge: 2020-07-07 | Disposition: A | Discharge status: Home / Self Care | Payer: Medicare Other | Attending: General Surgery | Admitting: General Surgery

## 2020-07-07 LAB — CBC WITH DIFFERENTIAL/PLATELET
Abs Immature Granulocytes: 0.44 10*3/uL — ABNORMAL HIGH (ref 0.00–0.07)
Basophils Absolute: 0.1 10*3/uL (ref 0.0–0.1)
Basophils Relative: 1 %
Eosinophils Absolute: 0.3 10*3/uL (ref 0.0–0.5)
Eosinophils Relative: 1 %
HCT: 27.5 % — ABNORMAL LOW (ref 39.0–52.0)
Hemoglobin: 9.2 g/dL — ABNORMAL LOW (ref 13.0–17.0)
Immature Granulocytes: 2 %
Lymphocytes Relative: 12 %
Lymphs Abs: 2.3 10*3/uL (ref 0.7–4.0)
MCH: 30.8 pg (ref 26.0–34.0)
MCHC: 33.5 g/dL (ref 30.0–36.0)
MCV: 92 fL (ref 80.0–100.0)
Monocytes Absolute: 0.8 10*3/uL (ref 0.1–1.0)
Monocytes Relative: 4 %
Neutro Abs: 15.8 10*3/uL — ABNORMAL HIGH (ref 1.7–7.7)
Neutrophils Relative %: 80 %
Platelets: 557 10*3/uL — ABNORMAL HIGH (ref 150–400)
RBC: 2.99 MIL/uL — ABNORMAL LOW (ref 4.22–5.81)
RDW: 15 % (ref 11.5–15.5)
WBC: 19.7 10*3/uL — ABNORMAL HIGH (ref 4.0–10.5)
nRBC: 0 % (ref 0.0–0.2)

## 2020-07-07 LAB — COMPREHENSIVE METABOLIC PANEL
ALT: 49 U/L — ABNORMAL HIGH (ref 0–44)
AST: 51 U/L — ABNORMAL HIGH (ref 15–41)
Albumin: 2 g/dL — ABNORMAL LOW (ref 3.5–5.0)
Alkaline Phosphatase: 189 U/L — ABNORMAL HIGH (ref 38–126)
Anion gap: 9 (ref 5–15)
BUN: 37 mg/dL — ABNORMAL HIGH (ref 8–23)
CO2: 21 mmol/L — ABNORMAL LOW (ref 22–32)
Calcium: 8.4 mg/dL — ABNORMAL LOW (ref 8.9–10.3)
Chloride: 106 mmol/L (ref 98–111)
Creatinine, Ser: 1.15 mg/dL (ref 0.61–1.24)
GFR, Estimated: >60 mL/min (ref >60)
Glucose, Bld: 134 mg/dL — ABNORMAL HIGH (ref 70–99)
Potassium: 3.8 mmol/L (ref 3.5–5.1)
Sodium: 136 mmol/L (ref 135–145)
Total Bilirubin: 0.6 mg/dL (ref 0.3–1.2)
Total Protein: 6.1 g/dL — ABNORMAL LOW (ref 6.5–8.1)

## 2020-07-07 LAB — GLUCOSE, CAPILLARY
Glucose-Capillary: 112 mg/dL — ABNORMAL HIGH (ref 70–99)
Glucose-Capillary: 124 mg/dL — ABNORMAL HIGH (ref 70–99)
Glucose-Capillary: 74 mg/dL (ref 70–99)
Glucose-Capillary: 97 mg/dL (ref 70–99)

## 2020-07-07 LAB — PHOSPHORUS: Phosphorus: 3.5 mg/dL (ref 2.5–4.6)

## 2020-07-07 LAB — MAGNESIUM: Magnesium: 2.3 mg/dL (ref 1.7–2.4)

## 2020-07-07 MED ORDER — MIDAZOLAM HCL 2 MG/2ML IJ SOLN
INTRAMUSCULAR | Status: AC | PRN
  Administered 2020-07-07: 0.5 mg via INTRAVENOUS

## 2020-07-07 MED ORDER — FENTANYL CITRATE (PF) 100 MCG/2ML IJ SOLN
INTRAMUSCULAR | Status: AC | PRN
  Administered 2020-07-07 (×2): 25 ug via INTRAVENOUS

## 2020-07-07 MED ORDER — TRAVASOL 10 % IV SOLN
INTRAVENOUS | Status: AC
  Filled 2020-07-07: qty 921.6

## 2020-07-07 MED ORDER — MIDAZOLAM HCL 5 MG/5ML IJ SOLN
INTRAMUSCULAR | Status: AC | PRN
  Administered 2020-07-07: 0.5 mg via INTRAVENOUS

## 2020-07-07 NOTE — Progress Notes (Addendum)
Patient at IR when came to see today. Have reviewed medical student notes.    IR drain done. Cultures sent. Appreciate help. TPN, NPO for continued leak Leukocytosis up, continue zosyn and antifungal for now, cultures will help guide Dispo planning  CT likely next week to reassess leak  Frederick Labrum, Frederick [name] [facility] [place],  [id number] [phone] (office)     [facility] Progress Note  9 Days Post-Op  Subjective: Pt doing well w/ no ON events, no pain or nausea this AM, and notes no change in thrush. Continues to desire to eat and prefer D/C to home rather than SNF. Pt seen w/ POA Frederick [name] present. She is wondering about how to prepare for home care this AM and report that pt's girlfriend Frederick [name]") will be coming tomorrow and staying through the end of the month to help care for pt.   Objective: Vital signs in last 24 hours: Temp:  [97.7 F (36.5 C)-98.2 F (36.8 C)] 98.2 F (36.8 C) (11/04 0506) Pulse Rate:  [71] 71 (11/03 2018) Resp:  [16-18] 16 (11/04 0506) BP: (169-171)/(88-92) 171/92 (11/04 0506) SpO2:  [98 %-100 %] 100 % (11/04 0506) Last BM Date: 07/04/20  Intake/Output from previous day: 11/03 0701 - 11/04 0700 In: 1057.3 [I.V.:868.6; IV Piggyback:188.7] Out: 0  Intake/Output this shift: No intake/output data recorded.  Constitutional: sleepy, cooperative HEENT: white plaques across dorsal aspect of tongue  Respiratory: breathing non-labored at rest  Cardiovascular: regular rate and sinus rhythm  Gastrointestinal: soft, non-tender, and non-distended, JP with minimal bile-tinged serous fluid in bulbw/ minimal suds. Incision:Dressing intact, no erythema no discharge.   Lab Results:  Recent Labs    07/06/20 0425 07/07/20 0511  WBC 17.5* 19.7*  HGB 8.5* 9.2*  HCT 25.3* 27.5*  PLT 488* 557*   BMET Recent Labs    07/06/20 0425 07/07/20 0511  NA 138 136  K 3.9 3.8   CL 107 106  CO2 22 21*  GLUCOSE 121* 134*  BUN 40* 37*  CREATININE 1.36* 1.15  CALCIUM 8.2* 8.4*   PT/INR Recent Labs    07/06/20 1336  LABPROT 16.1*  INR 1.3*    Studies/Results: CT ABDOMEN PELVIS W CONTRAST  Result Date: 07/06/2020 CLINICAL DATA:  Perforated gastric ulcer post Phillip Heal patch, leak on prior exam, follow-up; leukocytosis EXAM: CT ABDOMEN AND PELVIS WITH CONTRAST TECHNIQUE: Multidetector CT imaging of the abdomen and pelvis was performed using the standard protocol following bolus administration of intravenous contrast. Sagittal and coronal MPR images reconstructed from axial data set. CONTRAST:  120mL OMNIPAQUE IOHEXOL 300 MG/ML SOLN IV. Dilute oral contrast COMPARISON:  07/01/2020 FINDINGS: Lower chest: Small bibasilar pleural effusions and minimal compressive atelectasis of the lower lobes Hepatobiliary: Liver normal appearance. Minimal gallbladder wall thickening. No biliary dilatation. Pancreas: Normal appearance Spleen: Normal appearance Adrenals/Urinary Tract: Adrenal glands normal appearance. BILATERAL renal cysts, largest medial aspect inferior LEFT kidney 4.6 x 3.7 cm image 36. No hydronephrosis or ureteral dilatation. Minimal bladder wall thickening which may related outlet obstruction. Stomach/Bowel: Wall thickening of lesser curve and antrum of stomach. Tiny focus of extraluminal high attenuation supra posterior to the gastric antrum consistent with extravasated contrast. Small foci of gas adjacent to distal stomach and the distal aspect of the JP drain, could be related to gastric perforation or the drain. No additional extraluminal contrast collections. Small duodenal diverticula. Sigmoid diverticulosis. Remaining bowel loops unremarkable. Vascular/Lymphatic: Atherosclerotic calcifications aorta, iliac arteries, and femoral arteries without aneurysm. No adenopathy.  Reproductive: Mild prostatic enlargement gland measuring 5.0 x 3.2 cm. Other: Infiltration of fat planes  and upper abdomen related to recent surgery. Focal fluid collection identified lateral to the spleen, 9.8 x 3.5 x 8.9 cm, increased in size and demonstrating minimal marginal enhancement cannot exclude abscess. Tiny collection of fluid adjacent to spleen anteriorly 1.3 x 1.6 cm, may communicate with the larger collection listed above. Tiny collection of fluid anterior to the mid stomach 4.1 x 1.0 cm image 29. No additional fluid collections. No free air. No hernia. Musculoskeletal: Osseous demineralization with degenerative disc disease changes of thoracolumbar spine. IMPRESSION: Tiny focus of extraluminal high attenuation superoposterior to the gastric antrum consistent with extravasated contrast and minimal leak from the site of gastric ulcer repair. Small foci of gas adjacent to the distal stomach and the distal aspect of the JP-drain could be related to leak or JP drain. Interval increase in size of a fluid collection lateral to the spleen, 9.8 x 3.5 x 8.9 cm, with minimal marginal enhancement cannot exclude abscess, questionably communicating with an adjacent 1.6 cm diameter collection. Tiny nonspecific collection of fluid anterior to the mid stomach 4.1 x 1.0 cm. Small bibasilar pleural effusions and minimal compressive atelectasis of the lower lobes. Sigmoid diverticulosis without evidence of diverticulitis. Mild prostatic enlargement with wall thickening of bladder question muscular hypertrophy due to chronic outlet obstruction. Aortic Atherosclerosis (ICD10-I70.0). Electronically Signed   By: Lavonia Dana M.D.   On: 07/06/2020 10:33   DG Chest Portable 1 View  Result Date: 07/06/2020 CLINICAL DATA:  PICC line placement EXAM: PORTABLE CHEST 1 VIEW COMPARISON:  06/28/2020 FINDINGS: Interval extubation and removal of NG tube. Left PICC line is been placed with the tip at the cavoatrial junction. Right central line is unchanged. Heart is normal size. Mild hyperinflation. No confluent opacities or effusions.  IMPRESSION: Left PICC line tip at the cavoatrial junction. Interval extubation. Hyperinflation.  No acute cardiopulmonary disease. Electronically Signed   By: Rolm Baptise M.D.   On: 07/06/2020 19:10   [place] EKG SITE RITE  Result Date: 07/06/2020 If Site Rite image not attached, placement could not be confirmed due to current cardiac rhythm.   Anti-infectives: Anti-infectives (From admission, onward)   Start     Dose/Rate Route Frequency Ordered Stop   07/06/20 1015  anidulafungin (ERAXIS) 100 mg in sodium chloride 0.9 % 100 mL IVPB       "Followed by" Linked Group Details   100 mg 78 mL/hr over 100 Minutes Intravenous Every 24 hours 07/05/20 0922     07/05/20 1015  anidulafungin (ERAXIS) 200 mg in sodium chloride 0.9 % 200 mL IVPB       "Followed by" Linked Group Details   200 mg 78 mL/hr over 200 Minutes Intravenous  Once 07/05/20 0922 07/05/20 1659   07/04/20 1700  piperacillin-tazobactam (ZOSYN) IVPB 3.375 g        3.375 g 12.5 mL/hr over 240 Minutes Intravenous Every 8 hours 07/04/20 1055     07/01/20 1700  piperacillin-tazobactam (ZOSYN) IVPB 3.375 g  Status:  Discontinued        3.375 g 12.5 mL/hr over 240 Minutes Intravenous Every 8 hours 07/01/20 1248 07/04/20 1055   07/01/20 1400  tetracycline (SUMYCIN) capsule 500 mg  Status:  Discontinued       Note to Pharmacy: Empiric h pylori treatment   500 mg Oral 4 times daily 07/01/20 1203 07/01/20 1236   07/01/20 1400  metroNIDAZOLE (FLAGYL) tablet 250 mg  Status:  Discontinued       Note to Pharmacy: Empiric h pylori treatment   250 mg Oral 4 times daily 07/01/20 1203 07/01/20 1236   06/29/20 1000  piperacillin-tazobactam (ZOSYN) IVPB 3.375 g  Status:  Discontinued        3.375 g 12.5 mL/hr over 240 Minutes Intravenous Every 8 hours 06/29/20 0907 07/01/20 1205   06/28/20 2200  piperacillin-tazobactam (ZOSYN) IVPB 3.375 g  Status:  Discontinued        3.375 g 12.5 mL/hr over 240 Minutes Intravenous Every 12 hours 06/28/20 1407  06/29/20 0907   06/28/20 0600  piperacillin-tazobactam (ZOSYN) IVPB 3.375 g  Status:  Discontinued        3.375 g 12.5 mL/hr over 240 Minutes Intravenous Every 8 hours 06/28/20 0412 06/28/20 1407   06/28/20 0130  cefoTEtan (CEFOTAN) 2 g in sodium chloride 0.9 % 100 mL IVPB        2 g 200 mL/hr over 30 Minutes Intravenous On call to O.R. 06/27/20 2323 06/28/20 0200   06/27/20 2215  piperacillin-tazobactam (ZOSYN) IVPB 3.375 g        3.375 g 100 mL/hr over 30 Minutes Intravenous  Once 06/27/20 2203 06/27/20 2305      Assessment/Plan: s/p Procedure(s): EXPLORATORY LAPAROTOMY, graham patch  LOS: 9 days    Pt on post-op day 9 following exploratory laparotomy w/ graham patch w/ CT yesterday showing small leak remaining w/ some gas and LUQ collection and d/c of central line in favor of PICC for TPN. Today pt is clinically stable w/ labs significant for continued leukocytosis. Plan as follows:  - NPO, continue PICC for TPN - Continue TPN, IV zosyn and IV antifungal - Continue swish and shallow for thrush - IR for LUQ collection, drainage today - CT in 6 days (07/13/20) - SW for D/C planning  Raliegh Ip 07/07/2020

## 2020-07-07 NOTE — Plan of Care (Signed)

## 2020-07-07 NOTE — Progress Notes (Addendum)
PHARMACY - TOTAL PARENTERAL NUTRITION CONSULT NOTE    Indication: gastric perforation  Patient Measurements: Height: 5\' 8"  (172.7 cm) Weight: 66.4 kg (146 lb 6.2 oz) IBW/kg (Calculated) : 68.4 TPN AdjBW (KG): 63.5 Body mass index is 22.26 kg/m. Usual Weight:    Assessment: Per RD note: Inadequate oral intake related to acute illness (gastric ulcer perforation s/p ex-lap with omental patch) as evidenced by NPO status. Continue NPO, UGI likely Wednesday   Antibiotics: 11/4 Today is day #10 out of a total of  14 days of  Zosyn therapy  and day #2 of anidulafungin therapy for this 79 yo male with bowel perforation and new abscessm for which cultures are pending.  Blood and urine cultures have shown no growth to date and wound culture grew rare C. glabrata.  WBC count is trending up, but patient is afebrile.   Renal function remains unstable.  Plan: Continue Zosyn 3.375G IV q8h -->total 14 days of therapy, pending culture data and drain insertion per IR today Monitor labs, cultures and patient progress.   Glucose / Insulin: 112-134-->  SSI q8h sensitive scale --> 1 unit in 24 hrs  Electrolytes:    Na 136  K 3.8  Cl 106  Ca  8.4  Phos 3.5  Mg 2.3  Renal:  Scr 1.36>1.15 LFTs:   ALT 49    AST 51 TG: 101 Prealbumin:  6.0 Albumin: 1.8 Intake / Output:   Intake/Output Summary (Last 24 hours) at 07/07/2020 1126 Last data filed at 07/07/2020 0700 Gross per 24 hour  Intake 1057.32 ml  Output 600 ml  Net 457.32 ml    GI Imaging:  11/3 repeat CT abd/pelvis found small leak   Surgeries / Procedures:   s/p exp laparotomy w/ omental patch for perforated gastric ulcer  Central access:  CVC Triple Lumen placed 06/28/20 -->rt internal jugular 11/4 IR to place drain today  TPN start date:  06-30-20 at 1800  Nutritional Goals (per RD rec  on 06-30-20):  kCal: 6578-4696  Protein: 84-95g  Fluid: >1700 mL Goal TPN rate is 80 mL/hr (provides 92 g of protein and ~1827 kcals per day)  Current  Nutrition:  NG dislodged  10-29 Plan is to try to advance diet today to clears   Plan:  Continue TPN at 80 mL/hr at 1800  Electrolytes in TPN: 81mEq/L of Na, 49mEq/L of K, 61mEq/L of Ca, 55mEq/L of Mg, and 20 mmol/L of Phos.  Cl:Ac ratio 1:1 Add standard MVI and trace elements to TPN sliding scale to sensitive q8h and adjust as needed  Monitor TPN labs on Mon/Thurs   Despina Pole, Pharm. D. Clinical Pharmacist 07/07/2020 11:31 AM

## 2020-07-07 NOTE — Procedures (Signed)
Interventional Radiology Procedure Note  Procedure: CT luq abscess drain    Complications: None  Estimated Blood Loss:  min  Findings: 20cc pus cx sent    Tamera Punt, MD

## 2020-07-07 NOTE — Progress Notes (Signed)
Nutrition Follow-up  DOCUMENTATION CODES:   Not applicable  INTERVENTION:  TPN per pharmacy  RD will continue to follow for diet advancement   Recommend obtain current weight    NUTRITION DIAGNOSIS:   Inadequate oral intake related to acute illness (gastric ulcer perforation s/p ex-lap with omental patch) as evidenced by NPO status.   Pt remains NPO however, nutrition needs meet via TPN.  GOAL:   Patient will meet greater than or equal to 90% of their needs  MET  MONITOR:   Labs, I & O's, Diet advancement, Weight trends  ASSESSMENT:   79 year old male with no documented medical problems and has not been seen by a physician in 5 years presented with 2 week history of intermittent abdominal pain, occasionally worsened with oral intake. Patient reports unrelenting epigastric abdominal pain and nausea after eating some chocolate and found to have acute gastric perforation.  11/4-IR for intra abdominal drain placement today and fluid collection (20 cc pus)-per MD. Patient is s/p exploratory laparotomy with graham patch for perforated gastric ulcer on 10/26. Patient was started on TPN 10/28. Tolerating at goal rate of 80 ml/hr (providing 92 gr protein and 1827 kcal/day)- meeting 100% estimated nutrition needs. He remains NPO although he is wanting to eat per documentation.    Medications reviewed.  Labs: BMP Latest Ref Rng & Units 07/07/2020 07/06/2020 07/04/2020  Glucose 70 - 99 mg/dL 134(H) 121(H) 118(H)  BUN 8 - 23 mg/dL 37(H) 40(H) 40(H)  Creatinine 0.61 - 1.24 mg/dL 1.15 1.36(H) 1.30(H)  Sodium 135 - 145 mmol/L 136 138 137  Potassium 3.5 - 5.1 mmol/L 3.8 3.9 4.0  Chloride 98 - 111 mmol/L 106 107 104  CO2 22 - 32 mmol/L 21(L) 22 24  Calcium 8.9 - 10.3 mg/dL 8.4(L) 8.2(L) 8.4(L)     Diet Order:   Diet Order            Diet NPO time specified  Diet effective now                 EDUCATION NEEDS:   Education needs have been addressed  Skin:  Skin Assessment:  Reviewed RN Assessment  Last BM:  11/1  Height:   Ht Readings from Last 1 Encounters:  06/28/20 5' 8"  (1.727 m)    Weight:   Wt Readings from Last 1 Encounters:  07/03/20 66.4 kg    Ideal Body Weight:   70 kg  BMI:  Body mass index is 22.26 kg/m.  Estimated Nutritional Needs:   Kcal:  2947-6546  Protein:  84-95  Fluid:  >1.7 L/day   Colman Cater MS,RD,CSG,LDN Pager: Shea Evans

## 2020-07-07 NOTE — Consult Note (Signed)
Chief Complaint: Patient was seen in consultation today for intra abdominal abscess drain placement Chief Complaint  Patient presents with   Abdominal Pain   at the request of Dr Maurene Capes   Supervising Physician: Daryll Brod  Patient Status: [facility] IP  History of Present Illness: [place] Hert is a 79 y.o. male   abd pain x 2 weeks Worsening pain and nausea Does not take NSAIDS  Work up included imaging CT  IMPRESSION: 1. Moderate free air in the upper abdomen with small amount of free fluid in the abdomen and pelvis consistent with hollow viscus perforation. Suspected source is the pre-pyloric region of the stomach where there is wall thickening and apparent defect, either representing perforated ulcer or perforated gastric mass.  --- to OR with Dr Hayes Ludwig:  exploratory laparotomy w/ graham patch for perforated gastric ulcer 06/28/20  Doing well -- but noted fever and elevated wbc; bile lile OP from drain  New CT 10/29:  IMPRESSION: Small amount of extravasated contrast is seen adjacent to the tip of the Jackson-Pratt drain which extends to the lesser curve of the stomach consistent with subtle perforation/leak. Fluid in the LEFT upper quadrant laterally with scattered foci of free air and free fluid consistent with prior perforation, surgery and indwelling drain.  Now request for IR abd abscess drain placement Dr Laurence Ferrari has reviewed imaging and approves procedure Scheduled in IR at [facility] today   History reviewed. No pertinent past medical history.  Past Surgical History:  Procedure Laterality Date   LAPAROTOMY N/A 06/28/2020   Procedure: EXPLORATORY LAPAROTOMY, graham patch;  Surgeon: Virl Cagey, MD;  Location: [facility];  Service: General;  Laterality: N/A;    Allergies: Patient has no known allergies.  Medications: Prior to Admission medications   Medication Sig Start Date End Date Taking? Authorizing Provider  acetaminophen (TYLENOL)  500 MG tablet Take 500 mg by mouth every 6 (six) hours as needed.   Yes [provider]     History reviewed. No pertinent family history.  Social History   Socioeconomic History   Marital status: Widowed    Spouse name: Not on file   Number of children: Not on file   Years of education: Not on file   Highest education level: Not on file  Occupational History   Not on file  Tobacco Use   Smoking status: Light Tobacco Smoker  Substance and Sexual Activity   Alcohol use: Not on file   Drug use: Not on file   Sexual activity: Not on file  Other Topics Concern   Not on file  Social History Narrative   Not on file   Social Determinants of Health   Financial Resource Strain:    Difficulty of Paying Living Expenses: Not on file  Food Insecurity:    Worried About [place] in the Last Year: Not on file   Ran Out of Food in the Last Year: Not on file  Transportation Needs:    Lack of Transportation (Medical): Not on file   Lack of Transportation (Non-Medical): Not on file  Physical Activity:    Days of Exercise per Week: Not on file   Minutes of Exercise per Session: Not on file  Stress:    Feeling of Stress : Not on file  Social Connections:    Frequency of Communication with Friends and Family: Not on file   Frequency of Social Gatherings with Friends and Family: Not on file   Attends Religious  Services: Not on file   Active Member of Clubs or Organizations: Not on file   Attends Club or Organization Meetings: Not on file   Marital Status: Not on file    Review of Systems: A 12 point ROS discussed and pertinent positives are indicated in the HPI above.  All other systems are negative.  Review of Systems  Constitutional: Positive for activity change and appetite change. Negative for fever.  Respiratory: Negative for shortness of breath.   Gastrointestinal: Positive for abdominal pain and nausea.  Psychiatric/Behavioral:  Negative for behavioral problems and confusion.    Vital Signs: BP (!) 171/92 (BP Location: Left Arm)    Pulse 71    Temp 98.2 F (36.8 C) (Oral)    Resp 16    Ht 5\' 8"  (1.727 m)    Wt 146 lb 6.2 oz (66.4 kg)    SpO2 100%    BMI 22.26 kg/m   Physical Exam Vitals reviewed.  Cardiovascular:     Rate and Rhythm: Regular rhythm.  Pulmonary:     Breath sounds: Normal breath sounds.  Abdominal:     General: Bowel sounds are normal.     Tenderness: There is abdominal tenderness.  Skin:    General: Skin is warm.  Neurological:     Mental Status: He is alert.     Comments: Pleasant Mild confusion  Psychiatric:     Comments: Care giver Rhonda at bedside She consented for procedure via phone yesterday Consent in IR [facility] already     Imaging: CT ABDOMEN PELVIS WO CONTRAST  Result Date: 06/27/2020 CLINICAL DATA:  Abdominal pain EXAM: CT ABDOMEN AND PELVIS WITHOUT CONTRAST TECHNIQUE: Multidetector CT imaging of the abdomen and pelvis was performed following the standard protocol without IV contrast. COMPARISON:  None. FINDINGS: Lower chest: Lung bases demonstrate mild emphysematous disease. No acute consolidation or effusion. Minimal subpleural fibrosis. Coronary vascular calcification. Hepatobiliary: No focal liver abnormality is seen. No gallstones, gallbladder wall thickening, or biliary dilatation. Pancreas: Unremarkable. No pancreatic ductal dilatation or surrounding inflammatory changes. Spleen: Normal in size without focal abnormality. Adrenals/Urinary Tract: Adrenal glands are normal. Kidneys show no hydronephrosis. Multiple low-attenuation lesions within both kidneys favored to represent cysts though incompletely characterized without contrast. Slightly thick-walled urinary bladder. Stomach/Bowel: Wall thickening at the pre pyloric region of the stomach with suspected focal defect/perforation in the wall, coronal series 5, image number 23. No small bowel dilatation. Moderate stool in  the colon. Negative appendix. 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 Vascular/Lymphatic: Moderate aortic atherosclerosis without aneurysm. Ectatic common iliac vessels measuring 1.6 cm on the right and 1.8 cm on the left. No suspicious adenopathy. Reproductive: Slightly enlarged prostate with mass effect on the bladder Other: Moderate free air within the upper abdomen. Small amount of ascites within the abdomen and pelvis. Musculoskeletal: No acute or significant osseous findings. IMPRESSION: 1. Moderate free air in the upper abdomen with small amount of free fluid in the abdomen and pelvis consistent with hollow viscus perforation. Suspected source is the pre-pyloric region of the stomach where there is wall thickening and apparent defect, either representing perforated ulcer or perforated gastric mass. 2. 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 Slightly enlarged prostate. Bladder slightly thick walled which may be due to cystitis or chronic obstruction. Critical Value/emergent results were called by telephone at the time of interpretation on 06/27/2020 at 9:44 pm to provider JULIE HAVILAND , who verbally acknowledged these results. Aortic Atherosclerosis (ICD10-I70.0). Electronically Signed  By: Donavan Foil M.D.   On: 06/27/2020 21:44   CT ABDOMEN WO CONTRAST  Result Date: 07/01/2020 CLINICAL DATA:  Bile within Jackson-Pratt drain post Phillip Heal patch of perforated gastric ulcer EXAM: CT ABDOMEN WITHOUT CONTRAST TECHNIQUE: Multidetector CT imaging of the abdomen was performed following the standard protocol without IV contrast. Sagittal and coronal MPR images reconstructed from axial data set. No oral contrast was administered for this study; contrast is present within the stomach and bowel from preceding water-soluble upper GI. COMPARISON:  Upper GI 07/01/2020, CT abdomen and pelvis 06/27/2020 FINDINGS: Lower chest: Bibasilar effusions and atelectasis  Hepatobiliary: Distended gallbladder.  Liver unremarkable. Pancreas: Normal appearance Spleen: Normal appearance Adrenals/Urinary Tract: Multiple BILATERAL renal cysts. Unremarkable adrenal glands. Question tiny vascular calcification versus nonobstructing calculus LEFT renal pelvis. No additional urinary tract calcification or dilatation Stomach/Bowel: Dense contrast within stomach, small bowel loops and colon. Small amount of extravasated contrast is seen adjacent to the tip of the Jackson-Pratt drain which extends along the lesser curve of the stomach consistent with subtle perforation. This is in the general region of the surgical repair. This was not visible on the preceding upper GI exam. Remaining contrast is within the bowel. Duodenal diverticulum noted. Vascular/Lymphatic: Atherosclerotic calcifications aorta, coronary arteries, visceral arteries. No adenopathy Other: Fluid identified in the LEFT upper quadrant, unchanged. Few scattered foci of free air and free fluid consistent with surgery and drain. No additional focal fluid collection or extravasated contrast. Musculoskeletal: Bones demineralized. IMPRESSION: Small amount of extravasated contrast is seen adjacent to the tip of the Jackson-Pratt drain which extends to the lesser curve of the stomach consistent with subtle perforation/leak. Fluid in the LEFT upper quadrant laterally with scattered foci of free air and free fluid consistent with prior perforation, surgery and indwelling drain. Bibasilar effusions and atelectasis. Multiple BILATERAL renal cysts. Question tiny vascular calcification versus nonobstructing calculus LEFT renal pelvis. Aortic Atherosclerosis (ICD10-I70.0). Electronically Signed   By: Lavonia Dana M.D.   On: 07/01/2020 14:33   DG Abd 1 View  Result Date: 07/01/2020 CLINICAL DATA:  Bile coming out of Jackson-Pratt drain EXAM: ABDOMEN - 1 VIEW COMPARISON:  None FINDINGS: Contrast opacifies gastric lumen, duodenum small bowel  loops throughout abdomen and pelvis. No definite contrast is identified along the surgical drain. Osseous structures demineralized. IMPRESSION: No definite contrast extravasation identified. Electronically Signed   By: Lavonia Dana M.D.   On: 07/01/2020 13:04   CT ABDOMEN PELVIS W CONTRAST  Result Date: 07/06/2020 CLINICAL DATA:  Perforated gastric ulcer post Phillip Heal patch, leak on prior exam, follow-up; leukocytosis EXAM: CT ABDOMEN AND PELVIS WITH CONTRAST TECHNIQUE: Multidetector CT imaging of the abdomen and pelvis was performed using the standard protocol following bolus administration of intravenous contrast. Sagittal and coronal MPR images reconstructed from axial data set. CONTRAST:  128mL OMNIPAQUE IOHEXOL 300 MG/ML SOLN IV. Dilute oral contrast COMPARISON:  07/01/2020 FINDINGS: Lower chest: Small bibasilar pleural effusions and minimal compressive atelectasis of the lower lobes Hepatobiliary: Liver normal appearance. Minimal gallbladder wall thickening. No biliary dilatation. Pancreas: Normal appearance Spleen: Normal appearance Adrenals/Urinary Tract: Adrenal glands normal appearance. BILATERAL renal cysts, largest medial aspect inferior LEFT kidney 4.6 x 3.7 cm image 36. No hydronephrosis or ureteral dilatation. Minimal bladder wall thickening which may related outlet obstruction. Stomach/Bowel: Wall thickening of lesser curve and antrum of stomach. Tiny focus of extraluminal high attenuation supra posterior to the gastric antrum consistent with extravasated contrast. Small foci of gas adjacent to distal stomach and the distal aspect of the  JP drain, could be related to gastric perforation or the drain. No additional extraluminal contrast collections. Small duodenal diverticula. Sigmoid diverticulosis. Remaining bowel loops unremarkable. Vascular/Lymphatic: Atherosclerotic calcifications aorta, iliac arteries, and femoral arteries without aneurysm. No adenopathy. Reproductive: Mild prostatic  enlargement gland measuring 5.0 x 3.2 cm. Other: Infiltration of fat planes and upper abdomen related to recent surgery. Focal fluid collection identified lateral to the spleen, 9.8 x 3.5 x 8.9 cm, increased in size and demonstrating minimal marginal enhancement cannot exclude abscess. Tiny collection of fluid adjacent to spleen anteriorly 1.3 x 1.6 cm, may communicate with the larger collection listed above. Tiny collection of fluid anterior to the mid stomach 4.1 x 1.0 cm image 29. No additional fluid collections. No free air. No hernia. Musculoskeletal: Osseous demineralization with degenerative disc disease changes of thoracolumbar spine. IMPRESSION: Tiny focus of extraluminal high attenuation superoposterior to the gastric antrum consistent with extravasated contrast and minimal leak from the site of gastric ulcer repair. Small foci of gas adjacent to the distal stomach and the distal aspect of the JP-drain could be related to leak or JP drain. Interval increase in size of a fluid collection lateral to the spleen, 9.8 x 3.5 x 8.9 cm, with minimal marginal enhancement cannot exclude abscess, questionably communicating with an adjacent 1.6 cm diameter collection. Tiny nonspecific collection of fluid anterior to the mid stomach 4.1 x 1.0 cm. Small bibasilar pleural effusions and minimal compressive atelectasis of the lower lobes. Sigmoid diverticulosis without evidence of diverticulitis. Mild prostatic enlargement with wall thickening of bladder question muscular hypertrophy due to chronic outlet obstruction. Aortic Atherosclerosis (ICD10-I70.0). Electronically Signed   By: Lavonia Dana M.D.   On: 07/06/2020 10:33   US RENAL  Result Date: 06/28/2020 CLINICAL DATA:  79 year old male with acute renal insufficiency. EXAM: RENAL / URINARY TRACT ULTRASOUND COMPLETE COMPARISON:  None. FINDINGS: Right Kidney: Renal measurements: 10.9 x 4.3 x 3.7 cm = volume: 85 mL. Mild parenchyma atrophy and increased echogenicity.  No hydronephrosis or shadowing stone. There is a 3 cm upper pole cyst. Left Kidney: Renal measurements: 9.1 x 4.8 x 3.7 cm = volume: 85 mL. There is mild parenchyma atrophy and increased echogenicity. No hydronephrosis or shadowing stone. There is a 3 cm upper pole cyst and a 6 cm parapelvic cyst. Several way shin of the left kidney and cysts are limited due to suboptimal visualization, body habitus, and overlying bowel gas. Bladder: Not seen. Other: None. IMPRESSION: 1. Mildly atrophic and echogenic kidneys in keeping with chronic kidney disease. No hydronephrosis or shadowing stone. 2. Bilateral renal cysts, suboptimally evaluated due to body habitus and bowel gas. Electronically Signed   By: Anner Crete M.D.   On: 06/28/2020 16:13   DG Chest Portable 1 View  Result Date: 07/06/2020 CLINICAL DATA:  PICC line placement EXAM: PORTABLE CHEST 1 VIEW COMPARISON:  06/28/2020 FINDINGS: Interval extubation and removal of NG tube. Left PICC line is been placed with the tip at the cavoatrial junction. Right central line is unchanged. Heart is normal size. Mild hyperinflation. No confluent opacities or effusions. IMPRESSION: Left PICC line tip at the cavoatrial junction. Interval extubation. Hyperinflation.  No acute cardiopulmonary disease. Electronically Signed   By: Rolm Baptise M.D.   On: 07/06/2020 19:10   DG Chest Port 1 View  Result Date: 06/28/2020 CLINICAL DATA:  Central catheter placement.  Hypoxia. EXAM: PORTABLE CHEST 1 VIEW COMPARISON:  June 28, 2020 study obtained earlier in the day FINDINGS: Central catheter tip is in the superior  vena cava. Endotracheal tube tip is 6.0 cm above the carina. Nasogastric tube tip and side port are below the diaphragm. No pneumothorax. There is a small right pleural effusion. There is slight bibasilar atelectasis. Lungs elsewhere are clear. Heart size and pulmonary vascularity are normal. No adenopathy. There is degenerative change in each shoulder. IMPRESSION:  Tube and catheter positions as described without pneumothorax. Small right pleural effusion with slight bibasilar atelectasis. No edema or airspace opacity. Stable cardiac silhouette. Electronically Signed   By: Lowella Grip III M.D.   On: 06/28/2020 15:18   DG CHEST PORT 1 VIEW  Result Date: 06/28/2020 CLINICAL DATA:  Shortness of breath EXAM: PORTABLE CHEST 1 VIEW COMPARISON:  Earlier same day FINDINGS: Endotracheal and partially imaged enteric tubes again identified. Stable interstitial prominence likely reflecting changes of COPD. No significant pleural effusion. No pneumothorax. Stable cardiomediastinal contours. IMPRESSION: Stable appearance. Interstitial prominence is nonspecific and could reflect chronic changes of COPD. Electronically Signed   By: Macy Mis M.D.   On: 06/28/2020 09:44   DG Chest Port 1 View  Result Date: 06/28/2020 CLINICAL DATA:  Intubation. EXAM: PORTABLE CHEST 1 VIEW COMPARISON:  One-view chest x-ray 06/27/2020 FINDINGS: Patient has been intubated. Endotracheal tube terminates 4.8 cm above the carina. Side port of the NG tube courses scratched at the side port of the NG tube is in the fundus the stomach. The heart size is normal. Atherosclerotic calcifications are present at the aortic arch. Interstitial and airspace opacities have slightly increased diffusely. No significant airspace consolidation is present. No pneumothorax is present. IMPRESSION: 1. Interval intubation and placement of NG tube. Placement is satisfactory as described above. 2. Slight increase in interstitial and airspace disease diffusely. This is concerning for edema or infection. Electronically Signed   By: San Morelle M.D.   On: 06/28/2020 04:30   DG Chest Portable 1 View  Result Date: 06/27/2020 CLINICAL DATA:  Abdomen pain EXAM: PORTABLE CHEST 1 VIEW COMPARISON:  CT 06/27/2020 FINDINGS: The free air noted on CT is not well seen radiographically. No focal consolidation or effusion.  Normal cardiomediastinal silhouette with aortic atherosclerosis. No pneumothorax. IMPRESSION: No active disease. The free air noted on CT is not well seen radiographically. Electronically Signed   By: Donavan Foil M.D.   On: 06/27/2020 22:44   DG UGI W SINGLE CM (SOL OR THIN BA)  Result Date: 07/01/2020 CLINICAL DATA:  Post exploratory laparotomy and Phillip Heal patch of a perforated gastric ulcer at anterior wall of lesser curve EXAM: WATER SOLUBLE UPPER GI SERIES TECHNIQUE: Single-column upper GI series was performed using water soluble contrast. CONTRAST:  189mL ISOVUE-300 IOPAMIDOL (ISOVUE-300) INJECTION 61% orally COMPARISON:  CT abdomen pelvis 06/27/2020 FLUOROSCOPY TIME:  Fluoroscopy Time:  1 minutes 48 seconds Radiation Exposure Index (if provided by the fluoroscopic device): 32.0 mGy Number of Acquired Spot Images: 2 plus multiple fluoroscopic screen captures FINDINGS: Scout image demonstrates normal bowel gas pattern. Jackson-Pratt drain identified extending from duodenal bulb region along lesser curve. Stomach distends normally. Esophageal dysmotility noted. No gastric outlet obstruction. Mild edema is identified at distal gastric antrum especially at lesser curve. No contrast extravasation identified. No contrast accumulation along the surgical drain. Duodenum and visualized jejunal loops unremarkable. IMPRESSION: Edema at distal stomach corresponding to site of repair of a perforated gastric ulcer. No contrast extravasation or gastric outlet obstruction identified. Electronically Signed   By: Lavonia Dana M.D.   On: 07/01/2020 11:34   ECHOCARDIOGRAM COMPLETE  Result Date: 06/29/2020    ECHOCARDIOGRAM  REPORT   Patient Name:   DARELL SAPUTO Date of Exam: 06/29/2020 Medical Rec #:  [id number]    Height:       68.0 in Accession #:    [id number]   Weight:       140.0 lb Date of Birth:  09-26-1940   BSA:          1.756 m Patient Age:    25 years     BP:           128/48 mmHg Patient Gender: M             HR:           80 bpm. Exam Location:  [facility] Procedure: 2D Echo Indications:    Dyspnea 786.09 / R06.00  History:        Patient has no prior history of Echocardiogram examinations.                 Risk Factors:Current Smoker. Acute Respiratory Failue, Gastric                 Perforation, CHB, Acute Kidney Injury.  Sonographer:    Leavy Cella RDCS (AE) Referring Phys: [id number] [place]  1. Left ventricular ejection fraction, by estimation, is 55 to 60%. The left ventricle has normal function. The left ventricle has no regional wall motion abnormalities. There is mild left ventricular hypertrophy. Left ventricular diastolic parameters were normal.  2. Right ventricular systolic function is normal. The right ventricular size is normal.  3. The mitral valve is normal in structure. No evidence of mitral valve regurgitation. No evidence of mitral stenosis.  4. The aortic valve is tricuspid. There is mild calcification of the aortic valve. There is mild thickening of the aortic valve. Aortic valve regurgitation is not visualized. No aortic stenosis is present.  5. At least moderate pulmonary HTN, limited evaluation due to poorly visualized IVC.  6. The inferior vena cava is normal in size with greater than 50% respiratory variability, suggesting right atrial pressure of 3 mmHg. FINDINGS  Left Ventricle: Left ventricular ejection fraction, by estimation, is 55 to 60%. The left ventricle has normal function. The left ventricle has no regional wall motion abnormalities. The left ventricular internal cavity size was normal in size. There is  mild left ventricular hypertrophy. Left ventricular diastolic parameters were normal. Right Ventricle: The right ventricular size is normal. No increase in right ventricular wall thickness. Right ventricular systolic function is normal. Left Atrium: Left atrial size was normal in size. Right Atrium: Right atrial size was normal in size. Pericardium: There is no  evidence of pericardial effusion. Mitral Valve: The mitral valve is normal in structure. There is mild thickening of the mitral valve leaflet(s). There is mild calcification of the mitral valve leaflet(s). Mild mitral annular calcification. No evidence of mitral valve regurgitation. No evidence of mitral valve stenosis. Tricuspid Valve: The tricuspid valve is normal in structure. Tricuspid valve regurgitation is mild . No evidence of tricuspid stenosis. Aortic Valve: The aortic valve is tricuspid. There is mild calcification of the aortic valve. There is mild thickening of the aortic valve. There is mild aortic valve annular calcification. Aortic valve regurgitation is not visualized. No aortic stenosis  is present. Aortic valve mean gradient measures 4.7 mmHg. Aortic valve peak gradient measures 8.7 mmHg. Aortic valve area, by VTI measures 2.23 cm. Pulmonic Valve: The pulmonic valve was not well visualized. Pulmonic valve regurgitation is not visualized. No evidence of  pulmonic stenosis. Aorta: The aortic root is normal in size and structure. Pulmonary Artery: At least moderate pulmonary HTN, limited evaluation due to poorly visualized IVC. Venous: The inferior vena cava is normal in size with greater than 50% respiratory variability, suggesting right atrial pressure of 3 mmHg. IAS/Shunts: The interatrial septum was not well visualized.  LEFT VENTRICLE PLAX 2D LVIDd:         4.10 cm  Diastology LVIDs:         3.37 cm  LV e' medial:    13.40 cm/s LV PW:         1.10 cm  LV E/e' medial:  7.6 LV IVS:        1.09 cm  LV e' lateral:   11.70 cm/s LVOT diam:     1.90 cm  LV E/e' lateral: 8.7 LV SV:         57 LV SV Index:   33 LVOT Area:     2.84 cm  RIGHT VENTRICLE RV S prime:     10.00 cm/s TAPSE (M-mode): 2.3 cm LEFT ATRIUM             Index       RIGHT ATRIUM           Index LA diam:        3.10 cm 1.77 cm/m  RA Area:     14.30 cm LA Vol (A2C):   41.2 ml 23.46 ml/m RA Volume:   32.60 ml  18.56 ml/m LA Vol (A4C):    45.8 ml 26.08 ml/m LA Biplane Vol: 46.9 ml 26.70 ml/m  AORTIC VALVE AV Area (Vmax):    2.35 cm AV Area (Vmean):   2.29 cm AV Area (VTI):     2.23 cm AV Vmax:           147.68 cm/s AV Vmean:          100.148 cm/s AV VTI:            0.257 m AV Peak Grad:      8.7 mmHg AV Mean Grad:      4.7 mmHg LVOT Vmax:         122.52 cm/s LVOT Vmean:        80.995 cm/s LVOT VTI:          0.202 m LVOT/AV VTI ratio: 0.79  AORTA Ao Root diam: 3.30 cm MITRAL VALVE                TRICUSPID VALVE MV Area (PHT): 3.65 cm     TR Peak grad:   44.4 mmHg MV Decel Time: 208 msec     TR Vmax:        333.00 cm/s MV E velocity: 102.00 cm/s MV A velocity: 44.30 cm/s   SHUNTS MV E/A ratio:  2.30         Systemic VTI:  0.20 m                             Systemic Diam: 1.90 cm Carlyle Dolly MD Electronically signed by Carlyle Dolly MD Signature Date/Time: 06/29/2020/11:25:24 AM    Final    [place] EKG SITE RITE  Result Date: 07/06/2020 If Site Rite image not attached, placement could not be confirmed due to current cardiac rhythm.   Labs:  CBC: Recent Labs    07/03/20 0523 07/04/20 0507 07/06/20 0425 07/07/20 0511  WBC 14.8* 15.8* 17.5* 19.7*  HGB  9.3* 9.2* 8.5* 9.2*  HCT 28.5* 27.9* 25.3* 27.5*  PLT 323 368 488* 557*    COAGS: Recent Labs    07/06/20 1336  INR 1.3*    BMP: Recent Labs    07/03/20 0523 07/04/20 0507 07/06/20 0425 07/07/20 0511  NA 137 137 138 136  K 3.4* 4.0 3.9 3.8  CL 102 104 107 106  CO2 26 24 22  21*  GLUCOSE 105* 118* 121* 134*  BUN 33* 40* 40* 37*  CALCIUM 8.3* 8.4* 8.2* 8.4*  CREATININE 1.21 1.30* 1.36* 1.15  GFRNONAA >60 56* 53* >60    LIVER FUNCTION TESTS: Recent Labs    07/03/20 0523 07/04/20 0507 07/06/20 0425 07/07/20 0511  BILITOT 0.6 0.9 0.5 0.6  AST 20 20 44* 51*  ALT 17 16 33 49*  ALKPHOS 95 99 160* 189*  PROT 5.5* 5.5* 5.7* 6.1*  ALBUMIN 2.0* 1.9* 1.8* 2.0*    TUMOR MARKERS: No results for input(s): AFPTM, CEA, CA199, CHROMGRNA in the last 8760  hours.  Assessment and Plan:  Perforated gastric ulcer OR 10/26:  exploratory laparotomy w/ graham patch for perforated gastric ulcer High wbc and fever--- new collection note on ReCT. Scheduled now for intra abdominal abscess drain placement Risks and benefits discussed with the patient including bleeding, infection, damage to adjacent structures, bowel perforation/fistula connection, and sepsis.  All of the patient's questions were answered, patient is agreeable to proceed. Consent signed and in chart.   Thank you for this interesting consult.  I greatly enjoyed meeting Ajay Strubel and look forward to participating in their care.  A copy of this report was sent to the requesting provider on this date.  Electronically Signed: Lavonia Drafts, PA-C 07/07/2020, 8:22 AM   I spent a total of 40 Minutes    in face to face in clinical consultation, greater than 50% of which was counseling/coordinating care for intra abd abscess drain

## 2020-07-07 NOTE — Progress Notes (Signed)
PROGRESS NOTE   Frederick [name]  WER:[id number] DOB: September 03, 1941 DOA: 06/27/2020 PCP: Patient, No Pcp Per   Chief Complaint  Patient presents with  . Abdominal Pain   Brief Admission History:  79 y.o. male with no documented chronic medical problems presenting with 2-week history of abdominal pain that was intermittent in nature, occasionally worse with food.  The patient has not seen a physician for nearly 5 years.  He does not take any prescription medications.  Apparently, the patient ate some chocolate on 06/27/2020 after which he had unrelenting epigastric abdominal pain.  There was some nausea without any emesis.  The patient denies any NSAIDs.  Because of his abdominal pain, the patient presented for further evaluation.  He had denied any chest pain, shortness breath, diarrhea, dysuria.  In the emergency department, the patient was afebrile with soft blood pressures.  CT of the abdomen and pelvis showed moderate free air in the upper abdomen with a small amount of free fluid consistent with perforated viscus.  It was suspected that this was likely in the prepyloric region of the stomach.  General surgery was consulted.  The patient was taken to the operating room and underwent an exploratory laparotomy with vascular omental patch placement.  Unfortunately, there was difficulty extubating the patient postoperatively.  On the morning of 06/28/2020, the patient began developing hypotension.  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    Assessment & Plan:   Principal Problem:   Gastric perforation, acute Active Problems:   Gastric perforation (HCC)   Acute respiratory failure with hypoxia (HCC)   Hyperkalemia   AKI (acute kidney injury) ([place])   Complete heart block (HCC)   Hypotension   Endotracheally intubated   Perforated abdominal viscus   1)Gastric Ulcer Perforation-- s/p Exploratory laparotomy, vascularized omental patch  by Dr. Constance Haw on 06/28/2020 .  IV  protonix 40 mg every 12 hours.     -Repeat CT abdomen and pelvis from 07/06/2020 reviewed with radiology. Small leak remains patient continues to have bilious output from his JP drain, getting smaller and some gas in area and LUQ collection. Leukocytosis going up so - -07/07/20-Pt underwent successful CT-guided left upper quadrant subdiaphragmatic abscess drain placement at [facility]. Culture sent.  2)Septic Shock.  Hypotension - resolved now.  He was briefly on dobutamine infusion which is now discontinued. -Hemodynamically more stable  3)Candida Glabrata growing in peritoneal fluid - c/n anidulafungin IV started 07/05/20.   4)Hypertension - IV hydralazine ordered.   5)Hypokalemia / Hypophos - IV replacement, recheck   6)AV block - Appreciate cardiology consultation. Follow for now.  Avoiding beta blockers.  Arrange home cardiac monitor at discharge.    7)Stage 4 CKD - creatinine improved with IV fluids and supportive measures.   -Creatinine is down to 1.15 from 1.36 on 07/06/2020  8)Acute delirium -a lot more oriented, haldol IV prn.  Delirium precautions.   9)FEN--patient remains n.p.o. , continue TPN (started 06/30/20), IV zosyn and IV antifungal   DVT prophylaxis: sQ heparin  Code Status:  Full  Family Communication:   HCPOA/Ronda updated at bedside 11/4 Disposition: TBD   Status is: Inpatient  Remains inpatient appropriate because:Hemodynamically unstable, Persistent severe electrolyte disturbances, IV treatments appropriate due to intensity of illness or inability to take PO and Inpatient level of care appropriate due to severity of illness.  Pt remains on TPN.  Plan for image-guided intra-abdominal fluid collection aspiration with possible drain placement in [facility] IR tentatively on  07/07/2020 at [facility]. Dispo: The patient is from: Home              Anticipated d/c is to: TBD              Anticipated d/c date is: > 3 days              Patient currently is not medically stable to  d/c.  Consultants:   Cardiology  pccm  Surgery   IR  Procedures:   Central line placement 10/26  Exp lap with omental patch 10/26 --IRSuccessful CT-guided left upper quadrant subdiaphragmatic abscess drain placement. Culture sent.  Antimicrobials:  Zosyn 10/26>> anidulafungin 11/2>>  Subjective: - HCPOA Rhonda at bedside, questions answered, patient looking forward to IR procedure at [facility] later today No fever  Or chills  No Nausea, Vomiting or Diarrhea   Objective: Vitals:   07/07/20 1426 07/07/20 1445 07/07/20 1500 07/07/20 1619  BP:  (!) 144/72 (!) 143/75 (!) 145/70  Pulse: 72 76 66 66  Resp: _0 Temp:    98.4 F (36.9 C)  TempSrc:    Oral  SpO2: 99% 99% 98% 99%  Weight:      Height:        Intake/Output Summary (Last 24 hours) at 07/07/2020 1638 Last data filed at 07/07/2020 0700 Gross per 24 hour  Intake --  Output 600 ml  Net -600 ml   Filed Weights   06/29/20 1700 07/02/20 0439 07/03/20 0400  Weight: 70.1 kg 70.5 kg 66.4 kg   Examination:  General exam: elderly chronically ill appearing male, awake, alert, NAD, Respiratory system: Clear to auscultation. Respiratory effort normal. No crackles or wheezes heard.  Cardiovascular system: normal S1 & S2 heard. No JVD,  No pedal edema. Gastrointestinal system: Abdomen is nondistended, soft and   wound clean and dry and intact.  hypoactive BS. JP drain in place.  Central nervous system: Alert and oriented to person only. No focal neurological deficits. Extremities: Symmetric 5 x 5 power. Skin: No rashes, lesions or ulcers Psychiatry: intermittently agitated.   Data Reviewed: I have personally reviewed following labs and imaging studies  CBC: Recent Labs  Lab 07/02/20 0521 07/03/20 0523 07/04/20 0507 07/06/20 0425 07/07/20 0511  WBC 12.8* 14.8* 15.8* 17.5* 19.7*  NEUTROABS 10.0* 10.6* 10.7* 13.3* 15.8*  HGB 8.9* 9.3* 9.2* 8.5* 9.2*  HCT 27.0* 28.5* 27.9* 25.3* 27.5*  MCV 95.7  94.4 92.4 93.0 92.0  PLT 277 323 368 488* 191*   Basic Metabolic Panel: Recent Labs  Lab 07/01/20 0412 07/01/20 0412 07/02/20 0521 07/03/20 0523 07/04/20 0507 07/05/20 0438 07/06/20 0425 07/07/20 0511  NA 141   < > 141 137 137  --  138 136  K 3.7   < > 3.4* 3.4* 4.0  --  3.9 3.8  CL 105   < > 105 102 104  --  107 106  CO2 27   < > _1 --  22 21*  GLUCOSE 132*   < > 124* 105* 118*  --  121* 134*  BUN 33*   < > 31* 33* 40*  --  40* 37*  CREATININE 1.61*   < > 1.39* 1.21 1.30*  --  1.36* 1.15  CALCIUM 8.1*   < > 8.2* 8.3* 8.4*  --  8.2* 8.4*  MG 2.2  --  2.0  --  2.0  --  2.2 2.3  PHOS 2.4*   < > 2.3* 3.4  3.8 3.5  --  3.5   < > = values in this interval not displayed.   GFR: Estimated Creatinine Clearance: 48.1 mL/min (by C-G formula based on SCr of 1.15 mg/dL).  Liver Function Tests: Recent Labs  Lab 07/02/20 0521 07/03/20 0523 07/04/20 0507 07/06/20 0425 07/07/20 0511  AST _0 44* 51*  ALT _1 33 49*  ALKPHOS 97 95 99 160* 189*  BILITOT 0.8 0.6 0.9 0.5 0.6  PROT 5.2* 5.5* 5.5* 5.7* 6.1*  ALBUMIN 1.9* 2.0* 1.9* 1.8* 2.0*    CBG: Recent Labs  Lab 07/05/20 2119 07/06/20 0001 07/06/20 0749 07/07/20 0758 07/07/20 1141  GLUCAP 122* 115* 125* 112* 124*    Recent Results (from the past 240 hour(s))  Respiratory Panel by RT PCR (Flu A&B, Covid) - Nasopharyngeal Swab     Status: None   Collection Time: 06/27/20  9:48 PM   Specimen: Nasopharyngeal Swab  Result Value Ref Range Status   SARS Coronavirus 2 by RT PCR NEGATIVE NEGATIVE Final    Comment: (NOTE) SARS-CoV-2 target nucleic acids are NOT DETECTED.  The SARS-CoV-2 RNA is generally detectable in upper respiratoy specimens during the acute phase of infection. The lowest concentration of SARS-CoV-2 viral copies this assay can detect is 131 copies/mL. A negative result does not preclude SARS-Cov-2 infection and should not be used as the sole basis for treatment or other patient management  decisions. A negative result may occur with  improper specimen collection/handling, submission of specimen other than nasopharyngeal swab, presence of viral mutation(s) within the areas targeted by this assay, and inadequate number of viral copies (<131 copies/mL). A negative result must be combined with clinical observations, patient history, and epidemiological information. The expected result is Negative.  Fact Sheet for Patients:  PinkCheek.be  Fact Sheet for Healthcare Providers:  GravelBags.it  This test is no t yet approved or cleared by the [place] FDA and  has been authorized for detection and/or diagnosis of SARS-CoV-2 by FDA under an Emergency Use Authorization (EUA). This EUA will remain  in effect (meaning this test can be used) for the duration of the COVID-19 declaration under Section 564(b)(1) of the Act, 21 U.S.C. section 360bbb-3(b)(1), unless the authorization is terminated or revoked sooner.     Influenza A by PCR NEGATIVE NEGATIVE Final   Influenza B by PCR NEGATIVE NEGATIVE Final    Comment: (NOTE) The Xpert Xpress SARS-CoV-2/FLU/RSV assay is intended as an aid in  the diagnosis of influenza from Nasopharyngeal swab specimens and  should not be used as a sole basis for treatment. Nasal washings and  aspirates are unacceptable for Xpert Xpress SARS-CoV-2/FLU/RSV  testing.  Fact Sheet for Patients: PinkCheek.be  Fact Sheet for Healthcare Providers: GravelBags.it  This test is not yet approved or cleared by the [place] FDA and  has been authorized for detection and/or diagnosis of SARS-CoV-2 by  FDA under an Emergency Use Authorization (EUA). This EUA will remain  in effect (meaning this test can be used) for the duration of the  Covid-19 declaration under Section 564(b)(1) of the Act, 21  U.S.C. section 360bbb-3(b)(1), unless the  authorization is  terminated or revoked. Performed at [facility], [address] [place], [place] [address]   Aerobic/Anaerobic Culture (surgical/deep wound)     Status: None   Collection Time: 06/28/20  2:45 AM   Specimen: PATH Other; Tissue  Result Value Ref Range Status   Specimen Description   Final    PERITONEAL Performed  at [facility], [address], [place], [place] [address]    Special Requests   Final    GASTRIC ULCER PREFORATION Performed at [facility], [address] [place], [place] [address]    Gram Stain   Final    RARE WBC PRESENT, PREDOMINANTLY PMN NO ORGANISMS SEEN    Culture   Final    RARE Multiple Species Consistent With Normal Fecal Flora RARE CANDIDA GLABRATA NO ANAEROBES ISOLATED Performed at [facility], [address] [place], [place] [address]    Report Status 07/03/2020 FINAL  Final  Culture, blood (Routine X 2) w Reflex to ID Panel     Status: None   Collection Time: 06/28/20  3:21 PM   Specimen: BLOOD  Result Value Ref Range Status   Specimen Description BLOOD LEFT ANTECUBITAL  Final   Special Requests   Final    BOTTLES DRAWN AEROBIC AND ANAEROBIC Blood Culture adequate volume   Culture   Final    NO GROWTH 5 DAYS Performed at [facility], [address], [place], [place] [address]    Report Status 07/03/2020 FINAL  Final  Culture, blood (Routine X 2) w Reflex to ID Panel     Status: None   Collection Time: 06/28/20  3:21 PM   Specimen: BLOOD LEFT HAND  Result Value Ref Range Status   Specimen Description BLOOD LEFT HAND  Final   Special Requests   Final    BOTTLES DRAWN AEROBIC AND ANAEROBIC Blood Culture adequate volume   Culture   Final    NO GROWTH 5 DAYS Performed at [facility], [address], [place], [place] [address]    Report Status 07/03/2020 FINAL  Final  Culture, Urine     Status: None   Collection Time: 06/28/20  3:27 PM   Specimen: Urine, Clean Catch  Result Value Ref Range Status   Specimen Description    Final    URINE, CLEAN CATCH Performed at [facility], [address], [place], [place] [address]    Special Requests   Final    NONE Performed at [facility], [address] [place], [place] [address]    Culture   Final    NO GROWTH Performed at [facility], [place] [address], [place],  [address]    Report Status 06/30/2020 FINAL  Final     Radiology Studies: CT ABDOMEN PELVIS W CONTRAST  Result Date: 07/06/2020 CLINICAL DATA:  Perforated gastric ulcer post Phillip Heal patch, leak on prior exam, follow-up; leukocytosis EXAM: CT ABDOMEN AND PELVIS WITH CONTRAST TECHNIQUE: Multidetector CT imaging of the abdomen and pelvis was performed using the standard protocol following bolus administration of intravenous contrast. Sagittal and coronal MPR images reconstructed from axial data set. CONTRAST:  113m OMNIPAQUE IOHEXOL 300 MG/ML SOLN IV. Dilute oral contrast COMPARISON:  07/01/2020 FINDINGS: Lower chest: Small bibasilar pleural effusions and minimal compressive atelectasis of the lower lobes Hepatobiliary: Liver normal appearance. Minimal gallbladder wall thickening. No biliary dilatation. Pancreas: Normal appearance Spleen: Normal appearance Adrenals/Urinary Tract: Adrenal glands normal appearance. BILATERAL renal cysts, largest medial aspect inferior LEFT kidney 4.6 x 3.7 cm image 36. No hydronephrosis or ureteral dilatation. Minimal bladder wall thickening which may related outlet obstruction. Stomach/Bowel: Wall thickening of lesser curve and antrum of stomach. Tiny focus of extraluminal high attenuation supra posterior to the gastric antrum consistent with extravasated contrast. Small foci of gas adjacent to distal stomach and the distal aspect of the JP drain, could be related  to gastric perforation or the drain. No additional extraluminal contrast collections. Small duodenal diverticula. Sigmoid diverticulosis. Remaining bowel loops unremarkable. Vascular/Lymphatic: Atherosclerotic  calcifications aorta, iliac arteries, and femoral arteries without aneurysm. No adenopathy. Reproductive: Mild prostatic enlargement gland measuring 5.0 x 3.2 cm. Other: Infiltration of fat planes and upper abdomen related to recent surgery. Focal fluid collection identified lateral to the spleen, 9.8 x 3.5 x 8.9 cm, increased in size and demonstrating minimal marginal enhancement cannot exclude abscess. Tiny collection of fluid adjacent to spleen anteriorly 1.3 x 1.6 cm, may communicate with the larger collection listed above. Tiny collection of fluid anterior to the mid stomach 4.1 x 1.0 cm image 29. No additional fluid collections. No free air. No hernia. Musculoskeletal: Osseous demineralization with degenerative disc disease changes of thoracolumbar spine. IMPRESSION: Tiny focus of extraluminal high attenuation superoposterior to the gastric antrum consistent with extravasated contrast and minimal leak from the site of gastric ulcer repair. Small foci of gas adjacent to the distal stomach and the distal aspect of the JP-drain could be related to leak or JP drain. Interval increase in size of a fluid collection lateral to the spleen, 9.8 x 3.5 x 8.9 cm, with minimal marginal enhancement cannot exclude abscess, questionably communicating with an adjacent 1.6 cm diameter collection. Tiny nonspecific collection of fluid anterior to the mid stomach 4.1 x 1.0 cm. Small bibasilar pleural effusions and minimal compressive atelectasis of the lower lobes. Sigmoid diverticulosis without evidence of diverticulitis. Mild prostatic enlargement with wall thickening of bladder question muscular hypertrophy due to chronic outlet obstruction. Aortic Atherosclerosis (ICD10-I70.0). Electronically Signed   By: Lavonia Dana M.D.   On: 07/06/2020 10:33   DG Chest Portable 1 View  Result Date: 07/06/2020 CLINICAL DATA:  PICC line placement EXAM: PORTABLE CHEST 1 VIEW COMPARISON:  06/28/2020 FINDINGS: Interval extubation and removal  of NG tube. Left PICC line is been placed with the tip at the cavoatrial junction. Right central line is unchanged. Heart is normal size. Mild hyperinflation. No confluent opacities or effusions. IMPRESSION: Left PICC line tip at the cavoatrial junction. Interval extubation. Hyperinflation.  No acute cardiopulmonary disease. Electronically Signed   By: Rolm Baptise M.D.   On: 07/06/2020 19:10   CT IMAGE GUIDED DRAINAGE BY PERCUTANEOUS CATHETER  Result Date: 07/07/2020 INDICATION: Postop left upper quadrant subdiaphragmatic fluid collection EXAM: CT DRAINAGE LEFT UPPER QUADRANT FLUID COLLECTION MEDICATIONS: The patient is currently admitted to the hospital and receiving intravenous antibiotics. The antibiotics were administered within an appropriate time frame prior to the initiation of the procedure. ANESTHESIA/SEDATION: Fentanyl 50 mcg IV; Versed 1.0 mg IV Moderate Sedation Time:  15 MINUTES The patient was continuously monitored during the procedure by the interventional radiology nurse under my direct supervision. COMPLICATIONS: None immediate. PROCEDURE: Informed written consent was obtained from the [place] after a thorough discussion of the procedural risks, benefits and alternatives. All questions were addressed. Maximal Sterile Barrier Technique was utilized including caps, mask, sterile gowns, sterile gloves, sterile drape, hand hygiene and skin antiseptic. A timeout was performed prior to the initiation of the procedure. Previous imaging reviewed. Left upper quadrant perisplenic subdiaphragmatic fluid collection was localized. Overlying skin marked for a lower intercostal approach. Under sterile conditions and local anesthesia, an 18 gauge needle was advanced from a lateral lower intercostal approach. Needle position confirmed with CT. Syringe aspiration yielded purulent fluid. Sample sent for culture. Guidewire inserted followed by tract dilatation insert a 10 French drain. Drain catheter  position confirmed within the collection by  CT. Catheter secured with a Prolene suture. Suction bulb connected. Sterile dressing applied. 20 cc purulent fluid aspirated. IMPRESSION: Successful CT-guided left upper quadrant subdiaphragmatic abscess drain placement. Culture sent. Electronically Signed   By: Jerilynn Mages.  Shick M.D.   On: 07/07/2020 15:22   [place] EKG SITE RITE  Result Date: 07/06/2020 If Site Rite image not attached, placement could not be confirmed due to current cardiac rhythm.  Scheduled Meds: . chlorhexidine gluconate (MEDLINE KIT)  15 mL Mouth Rinse BID  . Chlorhexidine Gluconate Cloth  6 each Topical Daily  . insulin aspart  0-9 Units Subcutaneous Q8H  . mouth rinse  15 mL Mouth Rinse BID  . nystatin  5 mL Mouth/Throat QID  . pantoprazole (PROTONIX) IV  40 mg Intravenous Q12H  . sodium chloride flush  10-40 mL Intracatheter Q12H   Continuous Infusions: . anidulafungin 100 mg (07/06/20 1454)  . piperacillin-tazobactam (ZOSYN)  IV 3.375 g (07/07/20 0901)  . TPN ADULT (ION) 80 mL/hr at 07/06/20 2233  . TPN ADULT (ION)      LOS: 9 days   Roxan Hockey, MD.  07/07/2020, 4:38 PM

## 2020-07-08 LAB — COMPREHENSIVE METABOLIC PANEL
ALT: 55 U/L — ABNORMAL HIGH (ref 0–44)
AST: 50 U/L — ABNORMAL HIGH (ref 15–41)
Albumin: 2 g/dL — ABNORMAL LOW (ref 3.5–5.0)
Alkaline Phosphatase: 221 U/L — ABNORMAL HIGH (ref 38–126)
Anion gap: 9 (ref 5–15)
BUN: 36 mg/dL — ABNORMAL HIGH (ref 8–23)
CO2: 23 mmol/L (ref 22–32)
Calcium: 8.6 mg/dL — ABNORMAL LOW (ref 8.9–10.3)
Chloride: 105 mmol/L (ref 98–111)
Creatinine, Ser: 1.17 mg/dL (ref 0.61–1.24)
GFR, Estimated: >60 mL/min (ref >60)
Glucose, Bld: 112 mg/dL — ABNORMAL HIGH (ref 70–99)
Potassium: 4.2 mmol/L (ref 3.5–5.1)
Sodium: 137 mmol/L (ref 135–145)
Total Bilirubin: 0.7 mg/dL (ref 0.3–1.2)
Total Protein: 6.5 g/dL (ref 6.5–8.1)

## 2020-07-08 LAB — CBC WITH DIFFERENTIAL/PLATELET
Abs Immature Granulocytes: 0.37 10*3/uL — ABNORMAL HIGH (ref 0.00–0.07)
Basophils Absolute: 0.1 10*3/uL (ref 0.0–0.1)
Basophils Relative: 0 %
Eosinophils Absolute: 0.5 10*3/uL (ref 0.0–0.5)
Eosinophils Relative: 3 %
HCT: 31 % — ABNORMAL LOW (ref 39.0–52.0)
Hemoglobin: 10.1 g/dL — ABNORMAL LOW (ref 13.0–17.0)
Immature Granulocytes: 2 %
Lymphocytes Relative: 13 %
Lymphs Abs: 2.5 10*3/uL (ref 0.7–4.0)
MCH: 30.6 pg (ref 26.0–34.0)
MCHC: 32.6 g/dL (ref 30.0–36.0)
MCV: 93.9 fL (ref 80.0–100.0)
Monocytes Absolute: 0.8 10*3/uL (ref 0.1–1.0)
Monocytes Relative: 4 %
Neutro Abs: 15.4 10*3/uL — ABNORMAL HIGH (ref 1.7–7.7)
Neutrophils Relative %: 78 %
Platelets: 622 10*3/uL — ABNORMAL HIGH (ref 150–400)
RBC: 3.3 MIL/uL — ABNORMAL LOW (ref 4.22–5.81)
RDW: 15.2 % (ref 11.5–15.5)
WBC: 20.3 10*3/uL — ABNORMAL HIGH (ref 4.0–10.5)
nRBC: 0 % (ref 0.0–0.2)

## 2020-07-08 LAB — GLUCOSE, CAPILLARY
Glucose-Capillary: 117 mg/dL — ABNORMAL HIGH (ref 70–99)
Glucose-Capillary: 118 mg/dL — ABNORMAL HIGH (ref 70–99)
Glucose-Capillary: 112 mg/dL — ABNORMAL HIGH (ref 70–99)

## 2020-07-08 LAB — MAGNESIUM: Magnesium: 2.4 mg/dL (ref 1.7–2.4)

## 2020-07-08 LAB — PATHOLOGIST SMEAR REVIEW

## 2020-07-08 MED ORDER — TRAVASOL 10 % IV SOLN
INTRAVENOUS | Status: AC
  Filled 2020-07-08: qty 921.6

## 2020-07-08 NOTE — Progress Notes (Signed)
PROGRESS NOTE   Frederick [name]  VOP:[id number] DOB: [birth year] DOA: 06/27/2020 PCP: Patient, No Pcp Per   Chief Complaint  Patient presents with  . Abdominal Pain   Brief Admission History:  79 y.o. male with no documented chronic medical problems presenting with 2-week history of abdominal pain that was intermittent in nature, occasionally worse with food.  The patient has not seen a physician for nearly 5 years.  He does not take any prescription medications.  Apparently, the patient ate some chocolate on 06/27/2020 after which he had unrelenting epigastric abdominal pain.  There was some nausea without any emesis.  The patient denies any NSAIDs.  Because of his abdominal pain, the patient presented for further evaluation.  He had denied any chest pain, shortness breath, diarrhea, dysuria.  In the emergency department, the patient was afebrile with soft blood pressures.  CT of the abdomen and pelvis showed moderate free air in the upper abdomen with a small amount of free fluid consistent with perforated viscus.  It was suspected that this was likely in the prepyloric region of the stomach.  General surgery was consulted.  The patient was taken to the operating room and underwent an exploratory laparotomy with vascular omental patch placement.  Unfortunately, there was difficulty extubating the patient postoperatively.  On the morning of 06/28/2020, the patient began developing hypotension.  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    Assessment & Plan:   Principal Problem:   Gastric perforation, acute Active Problems:   Gastric perforation (HCC)   Acute respiratory failure with hypoxia (HCC)   Hyperkalemia   AKI (acute kidney injury) ([place])   Complete heart block (HCC)   Hypotension   Endotracheally intubated   Perforated abdominal viscus   1)Gastric Ulcer Perforation-- s/p Exploratory laparotomy, vascularized omental patch  by Dr. Constance Haw on 06/28/2020 .  IV  protonix 40 mg every 12 hours.     -Repeat CT abdomen and pelvis from 07/06/2020 reviewed with radiology. Small leak remains patient continues to have bilious output from his JP drain, getting smaller and some gas in area and LUQ collection. Leukocytosis going up  -07/07/20-Pt underwent successful CT-guided left upper quadrant subdiaphragmatic abscess drain placement at [facility]. --Drain culture from 07/07/2020 with GPC-continue IV Zosyn any further ID and sensitivity  2)Septic Shock.  Hypotension - resolved now.  He was briefly on dobutamine infusion which is now discontinued. -Hemodynamically more stable  3)Candida Glabrata growing in peritoneal fluid - c/n anidulafungin IV started 07/05/20.   4)Hypertension - IV hydralazine ordered.   5)Hypokalemia / Hypophos - IV replacement, recheck   6)AV block - Appreciate cardiology consultation. Follow for now.  Avoiding beta blockers.  Arrange home cardiac monitor at discharge.    7)Stage 4 CKD - creatinine improved with IV fluids and supportive measures.   -Creatinine is down to 1.15 from 1.36 on 07/06/2020  8)Acute delirium -a lot more oriented, haldol IV prn.  Delirium precautions.   9)FEN--patient remains n.p.o. , continue TPN (started 06/30/20), IV zosyn and IV antifungal  10)Social/Ethics--Rhonda is patient's primary decision maker,, patient remains a full code with full scope of treatment   DVT prophylaxis: sQ heparin  Code Status:  Full  Family Communication:   HCPOA/Ronda updated at bedside 11/4 Disposition: TBD   Status is: Inpatient  Remains inpatient appropriate because:Hemodynamically unstable, Persistent severe electrolyte disturbances, IV treatments appropriate due to intensity of illness or inability to take PO and Inpatient level of care appropriate  due to severity of illness.  Pt remains on TPN.   Dispo: The patient is from: Home              Anticipated d/c is to: TBD              Anticipated d/c date is: > 3 days               Patient currently is not medically stable to d/c.  Consultants:   Cardiology  pccm  Surgery   IR  Procedures:   Central line placement 10/26  Exp lap with omental patch 10/26 --07/07/20---IRSuccessful CT-guided left upper quadrant subdiaphragmatic abscess drain placement. Culture sent.  Antimicrobials:  Zosyn 10/26>> anidulafungin 11/2>>  Subjective: - -Abdominal pain is not worse, frustrated about inability to eat by mouth -Tolerating TPN well -Healthcare power of attorney at bedside   Objective: Vitals:   07/07/20 1500 07/07/20 1619 07/07/20 2154 07/08/20 0601  BP: (!) 143/75 (!) 145/70 (!) 147/76 (!) 176/83  Pulse: 66 66 89 64  Resp:  18 16 19   Temp:  98.4 F (36.9 C) 98.2 F (36.8 C) 98.4 F (36.9 C)  TempSrc:  Oral Oral Oral  SpO2: 98% 99% 94% 98%  Weight:      Height:        Intake/Output Summary (Last 24 hours) at 07/08/2020 1838 Last data filed at 07/08/2020 1827 Gross per 24 hour  Intake 1475.89 ml  Output 1035 ml  Net 440.89 ml   Filed Weights   06/29/20 1700 07/02/20 0439 07/03/20 0400  Weight: 70.1 kg 70.5 kg 66.4 kg   Examination:  General exam: elderly chronically ill appearing male, awake, alert, NAD, Respiratory system: Clear to auscultation. Respiratory effort normal. No crackles or wheezes heard.  Cardiovascular system: normal S1 & S2 heard. No JVD,  No pedal edema. Gastrointestinal system: Abdomen is nondistended, soft and   wound clean and dry and intact.  hypoactive BS. JP drain in place.  Central nervous system: Alert and oriented to person only. No focal neurological deficits. Extremities: Symmetric 5 x 5 power. Skin: No rashes, lesions or ulcers Psychiatry: intermittently agitated.   Data Reviewed: I have personally reviewed following labs and imaging studies  CBC: Recent Labs  Lab 07/03/20 0523 07/04/20 0507 07/06/20 0425 07/07/20 0511 07/08/20 0832  WBC 14.8* 15.8* 17.5* 19.7* 20.3*  NEUTROABS 10.6* 10.7* 13.3*  15.8* 15.4*  HGB 9.3* 9.2* 8.5* 9.2* 10.1*  HCT 28.5* 27.9* 25.3* 27.5* 31.0*  MCV 94.4 92.4 93.0 92.0 93.9  PLT 323 368 488* 557* 030*   Basic Metabolic Panel: Recent Labs  Lab 07/02/20 0521 07/02/20 0521 07/03/20 0523 07/04/20 0507 07/05/20 0438 07/06/20 0425 07/07/20 0511 07/08/20 0832  NA 141   < > 137 137  --  138 136 137  K 3.4*   < > 3.4* 4.0  --  3.9 3.8 4.2  CL 105   < > 102 104  --  107 106 105  CO2 27   < > 26 24  --  22 21* 23  GLUCOSE 124*   < > 105* 118*  --  121* 134* 112*  BUN 31*   < > 33* 40*  --  40* 37* 36*  CREATININE 1.39*   < > 1.21 1.30*  --  1.36* 1.15 1.17  CALCIUM 8.2*   < > 8.3* 8.4*  --  8.2* 8.4* 8.6*  MG 2.0  --   --  2.0  --  2.2 2.3 2.4  PHOS 2.3*  --  3.4 3.8 3.5  --  3.5  --    < > = values in this interval not displayed.   GFR: Estimated Creatinine Clearance: 47.3 mL/min (by C-G formula based on SCr of 1.17 mg/dL).  Liver Function Tests: Recent Labs  Lab 07/03/20 0523 07/04/20 0507 07/06/20 0425 07/07/20 0511 07/08/20 0832  AST 20 20 44* 51* 50*  ALT 17 16 33 49* 55*  ALKPHOS 95 99 160* 189* 221*  BILITOT 0.6 0.9 0.5 0.6 0.7  PROT 5.5* 5.5* 5.7* 6.1* 6.5  ALBUMIN 2.0* 1.9* 1.8* 2.0* 2.0*    CBG: Recent Labs  Lab 07/07/20 1141 07/07/20 1710 07/07/20 2347 07/08/20 0739 07/08/20 1627  GLUCAP 124* 74 97 117* 118*    Recent Results (from the past 240 hour(s))  Aerobic/Anaerobic Culture (surgical/deep wound)     Status: None (Preliminary result)   Collection Time: 07/07/20  2:34 PM   Specimen: Abscess  Result Value Ref Range Status   Specimen Description   Final    ABSCESS ABDOMEN Performed at [facility], [place] [address] [place], [place] [address]    Special Requests   Final    NONE Performed at [facility], [address] [place], [place] [address]    Gram Stain   Final    ABUNDANT WBC PRESENT,BOTH PMN AND MONONUCLEAR RARE GRAM POSITIVE COCCI    Culture   Final    NO GROWTH < 24 HOURS Performed at  [facility], [place] [address] [place],  [address]    Report Status PENDING  Incomplete     Radiology Studies: DG Chest Portable 1 View  Result Date: 07/06/2020 CLINICAL DATA:  PICC line placement EXAM: PORTABLE CHEST 1 VIEW COMPARISON:  06/28/2020 FINDINGS: Interval extubation and removal of NG tube. Left PICC line is been placed with the tip at the cavoatrial junction. Right central line is unchanged. Heart is normal size. Mild hyperinflation. No confluent opacities or effusions. IMPRESSION: Left PICC line tip at the cavoatrial junction. Interval extubation. Hyperinflation.  No acute cardiopulmonary disease. Electronically Signed   By: Rolm Baptise M.D.   On: 07/06/2020 19:10   CT IMAGE GUIDED DRAINAGE BY PERCUTANEOUS CATHETER  Result Date: 07/07/2020 INDICATION: Postop left upper quadrant subdiaphragmatic fluid collection EXAM: CT DRAINAGE LEFT UPPER QUADRANT FLUID COLLECTION MEDICATIONS: The patient is currently admitted to the hospital and receiving intravenous antibiotics. The antibiotics were administered within an appropriate time frame prior to the initiation of the procedure. ANESTHESIA/SEDATION: Fentanyl 50 mcg IV; Versed 1.0 mg IV Moderate Sedation Time:  15 MINUTES The patient was continuously monitored during the procedure by the interventional radiology nurse under my direct supervision. COMPLICATIONS: None immediate. PROCEDURE: Informed written consent was obtained from the [place] after a thorough discussion of the procedural risks, benefits and alternatives. All questions were addressed. Maximal Sterile Barrier Technique was utilized including caps, mask, sterile gowns, sterile gloves, sterile drape, hand hygiene and skin antiseptic. A timeout was performed prior to the initiation of the procedure. Previous imaging reviewed. Left upper quadrant perisplenic subdiaphragmatic fluid collection was localized. Overlying skin marked for a lower intercostal approach. Under  sterile conditions and local anesthesia, an 18 gauge needle was advanced from a lateral lower intercostal approach. Needle position confirmed with CT. Syringe aspiration yielded purulent fluid. Sample sent for culture. Guidewire inserted followed by tract dilatation insert a 10 French drain. Drain catheter position confirmed within the collection by CT. Catheter secured with a Prolene suture.  Suction bulb connected. Sterile dressing applied. 20 cc purulent fluid aspirated. IMPRESSION: Successful CT-guided left upper quadrant subdiaphragmatic abscess drain placement. Culture sent. Electronically Signed   By: Jerilynn Mages.  Shick M.D.   On: 07/07/2020 15:22   Scheduled Meds: . chlorhexidine gluconate (MEDLINE KIT)  15 mL Mouth Rinse BID  . Chlorhexidine Gluconate Cloth  6 each Topical Daily  . insulin aspart  0-9 Units Subcutaneous Q8H  . mouth rinse  15 mL Mouth Rinse BID  . nystatin  5 mL Mouth/Throat QID  . pantoprazole (PROTONIX) IV  40 mg Intravenous Q12H  . sodium chloride flush  10-40 mL Intracatheter Q12H   Continuous Infusions: . anidulafungin 100 mg (07/08/20 0958)  . piperacillin-tazobactam (ZOSYN)  IV 3.375 g (07/08/20 1226)  . TPN ADULT (ION) 80 mL/hr at 07/08/20 1759    LOS: 10 days   Roxan Hockey, MD.  07/08/2020, 6:38 PM

## 2020-07-08 NOTE — Progress Notes (Signed)
[facility] Progress Note  10 Days Post-Op  Subjective: IR drain yesterday growing GPC. Doing ok overall. Drain with some brown fluid in it with some suds. He is still refusing a SNF. Explained PICC/ TPN and needs for [facility] and limitations that he has. He will get less rehab at home too.   Objective: Vital signs in last 24 hours: Temp:  [98.2 F (36.8 C)-98.4 F (36.9 C)] 98.4 F (36.9 C) (11/05 0601) Pulse Rate:  [40-93] 64 (11/05 0601) Resp:  [12-28] 19 (11/05 0601) BP: (140-176)/(69-84) 176/83 (11/05 0601) SpO2:  [91 %-100 %] 98 % (11/05 0601) Last BM Date: 07/04/20  Intake/Output from previous day: 11/04 0701 - 11/05 0700 In: 1999.7 [I.V.:1999.7] Out: 1100 [Urine:950; Drains:150] Intake/Output this shift: No intake/output data recorded.  General appearance: alert and no distress Resp: normal work of breathing GI: soft, nondistended, midline c/d/i with staples and no erythema or drainage, JP with brownish fluid with some suds, serous fluid in the LUQ IR drain  Lab Results:  Recent Labs    07/07/20 0511 07/08/20 0832  WBC 19.7* 20.3*  HGB 9.2* 10.1*  HCT 27.5* 31.0*  PLT 557* 622*   BMET Recent Labs    07/06/20 0425 07/07/20 0511  NA 138 136  K 3.9 3.8  CL 107 106  CO2 22 21*  GLUCOSE 121* 134*  BUN 40* 37*  CREATININE 1.36* 1.15  CALCIUM 8.2* 8.4*   PT/INR Recent Labs    07/06/20 1336  LABPROT 16.1*  INR 1.3*    Studies/Results: DG Chest Portable 1 View  Result Date: 07/06/2020 CLINICAL DATA:  PICC line placement EXAM: PORTABLE CHEST 1 VIEW COMPARISON:  06/28/2020 FINDINGS: Interval extubation and removal of NG tube. Left PICC line is been placed with the tip at the cavoatrial junction. Right central line is unchanged. Heart is normal size. Mild hyperinflation. No confluent opacities or effusions. IMPRESSION: Left PICC line tip at the cavoatrial junction. Interval extubation. Hyperinflation.  No acute cardiopulmonary disease.  Electronically Signed   By: Rolm Baptise M.D.   On: 07/06/2020 19:10   CT IMAGE GUIDED DRAINAGE BY PERCUTANEOUS CATHETER  Result Date: 07/07/2020 INDICATION: Postop left upper quadrant subdiaphragmatic fluid collection EXAM: CT DRAINAGE LEFT UPPER QUADRANT FLUID COLLECTION MEDICATIONS: The patient is currently admitted to the hospital and receiving intravenous antibiotics. The antibiotics were administered within an appropriate time frame prior to the initiation of the procedure. ANESTHESIA/SEDATION: Fentanyl 50 mcg IV; Versed 1.0 mg IV Moderate Sedation Time:  15 MINUTES The patient was continuously monitored during the procedure by the interventional radiology nurse under my direct supervision. COMPLICATIONS: None immediate. PROCEDURE: Informed written consent was obtained from the [place] after a thorough discussion of the procedural risks, benefits and alternatives. All questions were addressed. Maximal Sterile Barrier Technique was utilized including caps, mask, sterile gowns, sterile gloves, sterile drape, hand hygiene and skin antiseptic. A timeout was performed prior to the initiation of the procedure. Previous imaging reviewed. Left upper quadrant perisplenic subdiaphragmatic fluid collection was localized. Overlying skin marked for a lower intercostal approach. Under sterile conditions and local anesthesia, an 18 gauge needle was advanced from a lateral lower intercostal approach. Needle position confirmed with CT. Syringe aspiration yielded purulent fluid. Sample sent for culture. Guidewire inserted followed by tract dilatation insert a 10 French drain. Drain catheter position confirmed within the collection by CT. Catheter secured with a Prolene suture. Suction bulb connected. Sterile dressing applied. 20 cc purulent fluid aspirated. IMPRESSION: Successful CT-guided left upper  quadrant subdiaphragmatic abscess drain placement. Culture sent. Electronically Signed   By: Jerilynn Mages.  Shick M.D.   On:  07/07/2020 15:22   [place] EKG SITE RITE  Result Date: 07/06/2020 If Site Rite image not attached, placement could not be confirmed due to current cardiac rhythm.   Anti-infectives: Anti-infectives (From admission, onward)   Start     Dose/Rate Route Frequency Ordered Stop   07/06/20 1015  anidulafungin (ERAXIS) 100 mg in sodium chloride 0.9 % 100 mL IVPB       "Followed by" Linked Group Details   100 mg 78 mL/hr over 100 Minutes Intravenous Every 24 hours 07/05/20 0922     07/05/20 1015  anidulafungin (ERAXIS) 200 mg in sodium chloride 0.9 % 200 mL IVPB       "Followed by" Linked Group Details   200 mg 78 mL/hr over 200 Minutes Intravenous  Once 07/05/20 0922 07/05/20 1659   07/04/20 1700  piperacillin-tazobactam (ZOSYN) IVPB 3.375 g        3.375 g 12.5 mL/hr over 240 Minutes Intravenous Every 8 hours 07/04/20 1055     07/01/20 1700  piperacillin-tazobactam (ZOSYN) IVPB 3.375 g  Status:  Discontinued        3.375 g 12.5 mL/hr over 240 Minutes Intravenous Every 8 hours 07/01/20 1248 07/04/20 1055   07/01/20 1400  tetracycline (SUMYCIN) capsule 500 mg  Status:  Discontinued       Note to Pharmacy: Empiric h pylori treatment   500 mg Oral 4 times daily 07/01/20 1203 07/01/20 1236   07/01/20 1400  metroNIDAZOLE (FLAGYL) tablet 250 mg  Status:  Discontinued       Note to Pharmacy: Empiric h pylori treatment   250 mg Oral 4 times daily 07/01/20 1203 07/01/20 1236   06/29/20 1000  piperacillin-tazobactam (ZOSYN) IVPB 3.375 g  Status:  Discontinued        3.375 g 12.5 mL/hr over 240 Minutes Intravenous Every 8 hours 06/29/20 0907 07/01/20 1205   06/28/20 2200  piperacillin-tazobactam (ZOSYN) IVPB 3.375 g  Status:  Discontinued        3.375 g 12.5 mL/hr over 240 Minutes Intravenous Every 12 hours 06/28/20 1407 06/29/20 0907   06/28/20 0600  piperacillin-tazobactam (ZOSYN) IVPB 3.375 g  Status:  Discontinued        3.375 g 12.5 mL/hr over 240 Minutes Intravenous Every 8 hours 06/28/20 0412  06/28/20 1407   06/28/20 0130  cefoTEtan (CEFOTAN) 2 g in sodium chloride 0.9 % 100 mL IVPB        2 g 200 mL/hr over 30 Minutes Intravenous On call to O.R. 06/27/20 2323 06/28/20 0200   06/27/20 2215  piperacillin-tazobactam (ZOSYN) IVPB 3.375 g        3.375 g 100 mL/hr over 30 Minutes Intravenous  Once 06/27/20 2203 06/27/20 2305      Assessment/Plan: MR. Dahlstrom is an 79 yo with a gastric perforation s/p omental patch that continues to have a leak. PRN for pain IS, OOB PT working with him and recommending SNF, patient is not wanting to go but we are discussing NPO, TPN  Will plan to repeat CT in about 7 days (would be about next Wednesday) CBC going up, GPC in the new IR drain fluid, continue antibiotics for intraabdominal infection and antifungal, will change if needed based on the sensitivities  Hep sq, SCDs PICC in place, needs to TPN  Discussed with Suanne Marker, Mr. Conde, and Dr. Denton Brick at the bedside the need for SNF and the realistic  expectations. Suanne Marker seems on board and patient considering.  Potentially can get to SNF or HH in next few days pending leukocytosis improving and arranging stuff.    LOS: 10 days    Virl Cagey 07/08/2020

## 2020-07-08 NOTE — Progress Notes (Signed)
Patient family at bedside wanting to complete POA forms, chaplain contacted and a voicemail was left.

## 2020-07-08 NOTE — TOC Progression Note (Signed)
Transition of Care [facility]) - Progression Note    Patient Details  Name: Frederick [name] MRN: [id number] Date of Birth: 07-08-1941  Transition [facility] CM/SW Contact  Shade Flood, LCSW Phone Number: 07/08/2020, 3:01 PM  Clinical Narrative:     TOC following. Today pt states that he is agreeable to SNF at dc. Pt prefers to go to [facility]. TOC will refer, updated Vaughan Basta at [place] and Pam at [facility]. Will continue to follow for dc planning.  Expected Discharge Plan: [place] Barriers to Discharge: Continued Medical Work up  Expected Discharge Plan and Services Expected Discharge Plan: [place] In-house Referral: Clinical Social Work   Post Acute Care Choice: [place] Living arrangements for the past 2 months: Single Family Home                                       Social Determinants of Health (SDOH) Interventions    Readmission Risk Interventions Readmission Risk Prevention Plan 07/08/2020 07/05/2020  Transportation Screening Complete Complete  Home Care Screening - Complete  Medication Review (RN CM) - Complete

## 2020-07-08 NOTE — Progress Notes (Signed)
PHARMACY - TOTAL PARENTERAL NUTRITION CONSULT NOTE    Indication: gastric perforation  Patient Measurements: Height: 5' 8"  (172.7 cm) Weight: 66.4 kg (146 lb 6.2 oz) IBW/kg (Calculated) : 68.4 TPN AdjBW (KG): 63.5 Body mass index is 22.26 kg/m. Usual Weight:    Assessment: Per RD note: Inadequate oral intake related to acute illness (gastric ulcer perforation s/p ex-lap with omental patch) as evidenced by NPO status.    Antibiotics: 11/5   day #11 out of a total of  14 days of  Zosyn and day #3 of anidulafungin  Drain is growing GPC  Glucose / Insulin: 112-117-->  SSI q8h sensitive scale --> no insulin in past 24 hrs  Electrolytes:  Na 137   K 4.2   Cl 105   Ca  8.6    Phos 3.5    Mg 2.3  Renal:  Scr 1.36>1.15>1.17 LFTs:   ALT 55     AST 50      Alk phos 221 TG: 101 Prealbumin:  6.0 Albumin: 2.0 Intake / Output:   Intake/Output Summary (Last 24 hours) at 07/08/2020 1121 Last data filed at 07/08/2020 0900 Gross per 24 hour  Intake 1999.69 ml  Output 1100 ml  Net 899.69 ml    GI Imaging:  11/3  CT abd/pelvis found small leak  -->repeat CT on Wednesday, 11-10 Surgeries / Procedures:   s/p exp laparotomy w/ omental patch for perforated gastric ulcer  Central access:  CVC Triple Lumen placed 06/28/20 -->rt internal jugular 11/4 drain placed    TPN start date:  06-30-20 at 1800  Nutritional Goals (per RD rec  on 06-30-20):  kCal: [id number]  Protein: 84-95g  Fluid: >1700 mL Goal TPN rate is 80 mL/hr (provides 92 g of protein and ~1827 kcals per day)  Current Nutrition:  TPN per central line   Plan:  Continue TPN at 80 mL/hr at 1800  Electrolytes in TPN: 4mq/L of Na, 544m/L of K, 19m16mL of Ca, 19mE97m of Mg, and 20 mmol/L of Phos.  Cl:Ac ratio 1:1 Standard MVI and trace elements added  to TPN sliding scale to sensitive q8h and adjust as needed  Monitor TPN labs on Mon/Thurs   TamaDespina Polearm. D. Clinical Pharmacist 07/08/2020 11:21 AM

## 2020-07-08 NOTE — Plan of Care (Signed)

## 2020-07-08 NOTE — Progress Notes (Signed)
Physical Therapy Treatment Patient Details Name: Frederick [name] MRN: [id number] DOB: Jul 27, 1941 Today's Date: 07/08/2020    History of Present Illness Frederick [name] is a 79 y.o. male s/p Exploratory laparotomy, vascularized omental patch on 06/28/20 who does not seek medical attention and has no PCP and takes no medications. He came in with acute onset of abdominal pain in his epigastric area after eating chocolate earlier today. The pain comes in waves and is not associated with any vomiting.    PT Comments    Patient demonstrates poor return for attempting to prop up on elbows for supine to sitting secondary to pressure sores, required much time and Mod assist to pull self to sitting, poor sitting balance with frequent leaning over to the left and limited for completing exercises due to fatigue/poor trunk control.  Patient able to take a few slow labored side steps with mostly shuffling of feet to transfer to chair.  Patient tolerated sitting up in chair after therapy - nursing staff notified.  Patient will benefit from continued physical therapy in hospital and recommended venue below to increase strength, balance, endurance for safe ADLs and gait.    Follow Up Recommendations  SNF;Supervision for mobility/OOB;Supervision/Assistance - 24 hour     Equipment Recommendations  Rolling walker with 5" wheels    Recommendations for Other Services       Precautions / Restrictions Precautions Precautions: Fall Restrictions Weight Bearing Restrictions: No    Mobility  Bed Mobility Overal bed mobility: Needs Assistance Bed Mobility: Supine to Sit     Supine to sit: Mod assist     General bed mobility comments: Patient had difficulty propping up on elbows due to pressure sores  Transfers Overall transfer level: Needs assistance Equipment used: Rolling walker (2 wheeled) Transfers: Sit to/from [facility] Sit to Stand: Mod assist Stand pivot transfers: Mod assist        General transfer comment: slow labored movement  Ambulation/Gait Ambulation/Gait assistance: Mod assist;Max assist Gait Distance (Feet): 8 Feet Assistive device: Rolling walker (2 wheeled) Gait Pattern/deviations: Decreased step length - right;Decreased step length - left;Decreased stride length;Trunk flexed;Shuffle Gait velocity: slow   General Gait Details: slow labored movement with occasional dragging of right leg, mostly suffling to transfer to chair   Stairs             Wheelchair Mobility    Modified Rankin (Stroke Patients Only)       Balance Overall balance assessment: Needs assistance Sitting-balance support: Feet supported;No upper extremity supported Sitting balance-Leahy Scale: Poor Sitting balance - Comments: frequent leaning over to the left, fair supporting with BUE   Standing balance support: During functional activity;Bilateral upper extremity supported Standing balance-Leahy Scale: Poor Standing balance comment: fair/poor using RW                            Cognition Arousal/Alertness: Awake/alert Behavior During Therapy: WFL for tasks assessed/performed Overall Cognitive Status: Within Functional Limits for tasks assessed                                        Exercises General Exercises - Lower Extremity Long Arc Quad: Seated;AROM;Strengthening;Both;5 reps Hip Flexion/Marching: Seated;AROM;Strengthening;Both;5 reps Toe Raises: Seated;AROM;Strengthening;Both;5 reps Heel Raises: Seated;AROM;Strengthening;Both;5 reps    General Comments        Pertinent Vitals/Pain Pain Assessment: Faces Faces Pain Scale: Hurts Frederick  more Pain Location: bilateral elbows and heels due to pressure sores Pain Descriptors / Indicators: Grimacing;Guarding Pain Intervention(s): Limited activity within patient's tolerance;Monitored during session;Repositioned    Home Living                      Prior Function             PT Goals (current goals can now be found in the care plan section) Acute Rehab PT Goals Patient Stated Goal: return home with family/friends to assist PT Goal Formulation: With patient Time For Goal Achievement: 07/18/20 Potential to Achieve Goals: Good Progress towards PT goals: Progressing toward goals    Frequency    Min 3X/week      PT Plan Current plan remains appropriate    Co-evaluation              AM-PAC PT "6 Clicks" Mobility   Outcome Measure  Help needed turning from your back to your side while in a flat bed without using bedrails?: A Lot Help needed moving from lying on your back to sitting on the side of a flat bed without using bedrails?: A Lot Help needed moving to and from a bed to a chair (including a wheelchair)?: A Lot Help needed standing up from a chair using your arms (e.g., wheelchair or bedside chair)?: A Lot Help needed to walk in hospital room?: A Lot Help needed climbing 3-5 steps with a railing? : Total 6 Click Score: 11    End of Session   Activity Tolerance: Patient tolerated treatment well;Patient limited by fatigue Patient left: in chair;with call bell/phone within reach;with chair alarm set Nurse Communication: Mobility status PT Visit Diagnosis: Unsteadiness on feet (R26.81);Other abnormalities of gait and mobility (R26.89);Muscle weakness (generalized) (M62.81)     Time: 3007-6226 PT Time Calculation (min) (ACUTE ONLY): 29 min  Charges:  $Therapeutic Exercise: 8-22 mins $Therapeutic Activity: 8-22 mins                     2:00 PM, 07/08/20 Lonell Grandchild, MPT Physical Therapist with [facility] [phone] office [phone] mobile phone

## 2020-07-08 NOTE — NC FL2 (Signed)
[name]     IDENTIFICATION  Patient Name: Frederick [name] Birthdate: [birth year] Sex: male Admission Date (Current Location): 06/27/2020  [facility] and [place] Number:  [employer] and Address:  [facility] [address] [place]      Provider Number: [id number]  Attending Physician Name and Address:  Roxan Hockey, MD  Relative Name and Phone Number:       Current Level of Care: Hospital Recommended Level of Care: [place] Prior Approval Number:    Date Approved/Denied:   PASRR Number: [id number]  Discharge Plan: SNF    Current Diagnoses: Patient Active Problem List   Diagnosis Date Noted  . Endotracheally intubated   . Perforated abdominal viscus   . Gastric perforation ([place]) 06/28/2020  . Acute respiratory failure with hypoxia ([place]) 06/28/2020  . Hyperkalemia 06/28/2020  . Gastric perforation, acute   . AKI (acute kidney injury) ([place])   . Complete heart block ([place])   . Hypotension     Orientation RESPIRATION BLADDER Height & Weight     Self, Time, Situation, Place  Normal Continent Weight: 146 lb 6.2 oz (66.4 kg) Height:  $Remove'5\' 8"'Nisydxu$  (172.7 cm)  BEHAVIORAL SYMPTOMS/MOOD NEUROLOGICAL BOWEL NUTRITION STATUS      Continent Diet (see dc summary)  AMBULATORY STATUS COMMUNICATION OF NEEDS Skin   Limited Assist Verbally Surgical wounds                       Personal Care Assistance Level of Assistance  Bathing, Feeding, Dressing Bathing Assistance: Limited assistance Feeding assistance: Independent Dressing Assistance: Limited assistance     Functional Limitations Info  Sight, Hearing, Speech Sight Info: Adequate Hearing Info: Adequate Speech Info: Adequate    SPECIAL CARE FACTORS FREQUENCY  PT (By licensed PT), OT (By licensed OT)     PT Frequency: 5x week OT Frequency: 3x week            Contractures Contractures Info: Not present     Additional Factors Info  Code Status, Allergies Code Status Info: full Allergies Info: NKA           Current Medications (07/08/2020):  This is the current hospital active medication list Current Facility-Administered Medications  Medication Dose Route Frequency Provider Last Rate Last Admin  . anidulafungin (ERAXIS) 100 mg in sodium chloride 0.9 % 100 mL IVPB  100 mg Intravenous Q24H Johnson, Clanford L, MD 78 mL/hr at 07/08/20 0958 100 mg at 07/08/20 0958  . chlorhexidine gluconate (MEDLINE KIT) (PERIDEX) 0.12 % solution 15 mL  15 mL Mouth Rinse BID Virl Cagey, MD   15 mL at 07/08/20 0901  . Chlorhexidine Gluconate Cloth 2 % PADS 6 each  6 each Topical Daily Virl Cagey, MD   6 each at 07/08/20 0901  . diphenhydrAMINE (BENADRYL) 12.5 MG/5ML elixir 12.5 mg  12.5 mg Oral Q6H PRN Virl Cagey, MD   12.5 mg at 07/07/20 2153   Or  . diphenhydrAMINE (BENADRYL) injection 12.5 mg  12.5 mg Intravenous Q6H PRN Virl Cagey, MD   12.5 mg at 06/30/20 1143  . haloperidol lactate (HALDOL) injection 2 mg  2 mg Intravenous Q6H PRN Johnson, Clanford L, MD      . hydrALAZINE (APRESOLINE) injection 10 mg  10 mg Intravenous Q4H PRN Wynetta Emery, Clanford L, MD   10 mg at 07/04/20 2051  . HYDROmorphone (DILAUDID) injection 0.5-1 mg  0.5-1 mg  Intravenous Q3H PRN Wynetta Emery, Clanford L, MD   1 mg at 07/04/20 2051  . insulin aspart (novoLOG) injection 0-9 Units  0-9 Units Subcutaneous Q8H Johnson, Clanford L, MD   1 Units at 07/05/20 1808  . ipratropium-albuterol (DUONEB) 0.5-2.5 (3) MG/3ML nebulizer solution 3 mL  3 mL Nebulization Q4H PRN Johnson, Clanford L, MD      . MEDLINE mouth rinse  15 mL Mouth Rinse BID Virl Cagey, MD   15 mL at 07/08/20 0901  . nystatin (MYCOSTATIN) 100000 UNIT/ML suspension 500,000 Units  5 mL Mouth/Throat QID Virl Cagey, MD   500,000 Units at 07/08/20 0900  . ondansetron (ZOFRAN-ODT) disintegrating tablet 4 mg  4 mg Oral Q6H PRN Virl Cagey, MD       Or  . ondansetron [facility]) injection 4 mg  4 mg Intravenous Q6H PRN Virl Cagey, MD      . pantoprazole (PROTONIX) injection 40 mg  40 mg Intravenous Q12H Johnson, Clanford L, MD   40 mg at 07/08/20 0900  . piperacillin-tazobactam (ZOSYN) IVPB 3.375 g  3.375 g Intravenous Q8H Johnson, Clanford L, MD 12.5 mL/hr at 07/08/20 1226 3.375 g at 07/08/20 1226  . sodium chloride flush (NS) 0.9 % injection 10-40 mL  10-40 mL Intracatheter Q12H Virl Cagey, MD   10 mL at 07/08/20 0902  . sodium chloride flush (NS) 0.9 % injection 10-40 mL  10-40 mL Intracatheter PRN Virl Cagey, MD      . TPN ADULT (ION)   Intravenous Continuous TPN Roxan Hockey, MD 80 mL/hr at 07/07/20 1831 New Bag at 07/07/20 1831  . TPN ADULT (ION)   Intravenous Continuous TPN Roxan Hockey, MD         Discharge Medications: Please see discharge summary for a list of discharge medications.  Relevant Imaging Results:  Relevant Lab Results:   Additional Information SSN: [place]  Shade Flood, LCSW

## 2020-07-08 NOTE — Care Management Important Message (Signed)
Important Message  Patient Details  Name: Frederick [name] MRN: [id number] Date of Birth: [birth year]   Medicare Important Message Given:  Yes     Tommy Medal 07/08/2020, 4:20 PM

## 2020-07-08 NOTE — Progress Notes (Signed)
Pt states no pain however during assessment pt was moaning, increased respirations, and facial grimacing. I offered pain medication and pt refused stating there is no need for that. I assessed cognition and pt was only able to answer 2/4 questions appropriately. When asked why was he here pt stated because of his RV. I reoriented pt to surgical procedure and healing process. Pt further insisted on no pain medication. I advised the pt I will reassess and follow up with MD if symptoms persist.

## 2020-07-09 LAB — COMPREHENSIVE METABOLIC PANEL
ALT: 52 U/L — ABNORMAL HIGH (ref 0–44)
AST: 43 U/L — ABNORMAL HIGH (ref 15–41)
Albumin: 2 g/dL — ABNORMAL LOW (ref 3.5–5.0)
Alkaline Phosphatase: 202 U/L — ABNORMAL HIGH (ref 38–126)
Anion gap: 10 (ref 5–15)
BUN: 39 mg/dL — ABNORMAL HIGH (ref 8–23)
CO2: 20 mmol/L — ABNORMAL LOW (ref 22–32)
Calcium: 8.4 mg/dL — ABNORMAL LOW (ref 8.9–10.3)
Chloride: 106 mmol/L (ref 98–111)
Creatinine, Ser: 1.21 mg/dL (ref 0.61–1.24)
GFR, Estimated: >60 mL/min (ref >60)
Glucose, Bld: 127 mg/dL — ABNORMAL HIGH (ref 70–99)
Potassium: 4.3 mmol/L (ref 3.5–5.1)
Sodium: 136 mmol/L (ref 135–145)
Total Bilirubin: 0.6 mg/dL (ref 0.3–1.2)
Total Protein: 6.3 g/dL — ABNORMAL LOW (ref 6.5–8.1)

## 2020-07-09 LAB — GLUCOSE, CAPILLARY
Glucose-Capillary: 124 mg/dL — ABNORMAL HIGH (ref 70–99)
Glucose-Capillary: 116 mg/dL — ABNORMAL HIGH (ref 70–99)
Glucose-Capillary: 114 mg/dL — ABNORMAL HIGH (ref 70–99)

## 2020-07-09 LAB — CBC WITH DIFFERENTIAL/PLATELET
Abs Immature Granulocytes: 0.23 10*3/uL — ABNORMAL HIGH (ref 0.00–0.07)
Basophils Absolute: 0.1 10*3/uL (ref 0.0–0.1)
Basophils Relative: 0 %
Eosinophils Absolute: 0.5 10*3/uL (ref 0.0–0.5)
Eosinophils Relative: 3 %
HCT: 26.7 % — ABNORMAL LOW (ref 39.0–52.0)
Hemoglobin: 8.8 g/dL — ABNORMAL LOW (ref 13.0–17.0)
Immature Granulocytes: 1 %
Lymphocytes Relative: 16 %
Lymphs Abs: 2.7 10*3/uL (ref 0.7–4.0)
MCH: 30.3 pg (ref 26.0–34.0)
MCHC: 33 g/dL (ref 30.0–36.0)
MCV: 92.1 fL (ref 80.0–100.0)
Monocytes Absolute: 0.8 10*3/uL (ref 0.1–1.0)
Monocytes Relative: 5 %
Neutro Abs: 12.6 10*3/uL — ABNORMAL HIGH (ref 1.7–7.7)
Neutrophils Relative %: 75 %
Platelets: 633 10*3/uL — ABNORMAL HIGH (ref 150–400)
RBC: 2.9 MIL/uL — ABNORMAL LOW (ref 4.22–5.81)
RDW: 15 % (ref 11.5–15.5)
WBC: 16.9 10*3/uL — ABNORMAL HIGH (ref 4.0–10.5)
nRBC: 0 % (ref 0.0–0.2)

## 2020-07-09 LAB — MAGNESIUM: Magnesium: 2.3 mg/dL (ref 1.7–2.4)

## 2020-07-09 MED ORDER — TRAVASOL 10 % IV SOLN
INTRAVENOUS | Status: AC
  Filled 2020-07-09: qty 921.6

## 2020-07-09 MED ORDER — SODIUM CHLORIDE 0.9% FLUSH
5.0000 mL | Freq: Three times a day (TID) | INTRAVENOUS | Status: DC
  Administered 2020-07-09 – 2020-07-12 (×6): 5 mL

## 2020-07-09 NOTE — Progress Notes (Signed)
Pt alert and oriented to person/place. When asked about reason for hospitalization, pt states, "Well, they've been working on me a lot, I guess I'll get better!" but cannot verbalize diagnosis or procedures performed. When asked about his JP drains and surgical incision, pt deflects questions and talks about his daughter and asks this nurse how long I have been a [employer].  Both JP drains intact. Left drain with no dressing present at insertion site. Tube milked and small amount serosanguinous drainage noted. Right drain with DDI to insertion site. Tube milked and small amount of thick, creamy white/tan colored drainage noted in bulb. Surgical incision to mid upper abd clean & dry with staples intact, no redness or tenderness noted.  TPN infusing via PICC line left upper arm without noted issue.  Pt denies any c/o pain, but noted to be moaning and grunting occasionally, especially with movement. When questioned, pt states, "I'm just an old man!"

## 2020-07-09 NOTE — Progress Notes (Signed)
TPN bag due at 1800 has not been delivered. Pharmacist T. Meyer notified and aware.

## 2020-07-09 NOTE — Progress Notes (Signed)
   IR round note via phone  LUQ abscess drain placed in IR 11/4  RN says pt is doing well No complaints No pain OP of drain is milky tan ~25 cc in JP now Site is clean and dry Has not flushed yet-- I will place order  Wbc trending down Low grade temp this am   Will follow

## 2020-07-09 NOTE — Progress Notes (Signed)
PROGRESS NOTE   Frederick [name]  MRN:[id number] DOB: 09/06/1940 DOA: 06/27/2020 PCP: Patient, No Pcp Per   Chief Complaint  Patient presents with  . Abdominal Pain   Brief Admission History:  78 y.o. male with no documented chronic medical problems presenting with 2-week history of abdominal pain that was intermittent in nature, occasionally worse with food.  The patient has not seen a physician for nearly 5 years.  He does not take any prescription medications.  Apparently, the patient ate some chocolate on 06/27/2020 after which he had unrelenting epigastric abdominal pain.  There was some nausea without any emesis.  The patient denies any NSAIDs.  Because of his abdominal pain, the patient presented for further evaluation.  He had denied any chest pain, shortness breath, diarrhea, dysuria.  In the emergency department, the patient was afebrile with soft blood pressures.  CT of the abdomen and pelvis showed moderate free air in the upper abdomen with a small amount of free fluid consistent with perforated viscus.  It was suspected that this was likely in the prepyloric region of the stomach.  General surgery was consulted.  The patient was taken to the operating room and underwent an exploratory laparotomy with vascular omental patch placement.  Unfortunately, there was difficulty extubating the patient postoperatively.  On the morning of 06/28/2020, the patient began developing hypotension.  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    Assessment & Plan:   Principal Problem:   Gastric perforation, acute Active Problems:   Gastric perforation (HCC)   Acute respiratory failure with hypoxia (HCC)   Hyperkalemia   AKI (acute kidney injury) (HCC)   Complete heart block (HCC)   Hypotension   Endotracheally intubated   Perforated abdominal viscus   1)Gastric Ulcer Perforation-- s/p Exploratory Laparotomy, vascularized omental patch  by Dr. Bridges on 06/28/2020 .C/n   protonix  -Repeat CT abdomen and pelvis from 07/06/2020 reviewed with radiology. Small leak remains patient continues to have bilious output from his JP drain, getting smaller and some gas in area and LUQ collection. Leukocytosis going up  -07/07/20-Pt underwent successful CT-guided left upper quadrant subdiaphragmatic abscess drain placement at [facility]. --Drain culture from 07/07/2020 with [facility]-continue IV Zosyn any further ID and sensitivity 07/09/20 -Continues to have purulent drainage from the left flank IR drain tube -Much less bilious drainage from anterior abdominal JP drain  2)Septic Shock.  Hypotension - resolved now.  He was briefly on dobutamine infusion which is now discontinued. -Hemodynamically more stable -WBC is down to 16.9 from a peak of 20.3  3)Candida Glabrata growing in peritoneal fluid - c/n anidulafungin IV started 07/05/20.   4)Hypertension - IV hydralazine ordered.   5)Hypokalemia / Hypophos - IV replacement, recheck   6)AV block - Appreciate cardiology consultation. Follow for now.  Avoiding beta blockers.  Arrange home cardiac monitor at discharge.    7)Stage 4 CKD - creatinine improved with IV fluids and supportive measures.   -Creatinine normalized with hydration  8)Acute delirium -a lot more oriented, haldol IV prn.  Delirium precautions.   9)FEN--patient remains n.p.o. until biliary leak and associated intra-abdominal abscess improves/resolves, -- continue TPN (started 06/30/20), IV zosyn and IV antifungal  10)Social/Ethics--Rhonda is patient's primary decision maker,, patient remains a full code with full scope of treatment   DVT prophylaxis: sQ heparin  Code Status:  Full  Family Communication:   HCPOA/Ronda updated at bedside 11/4 Disposition: TBD   Status is: Inpatient  Remains inpatient   appropriate because:Currently n.p.o. on IV TPN due to biliary leak and intra-abdominal abscess, requiring IV antibiotics and IV antifungal agents pending final  culture results.  Pt remains on TPN.   Dispo: The patient is from: Home              Anticipated d/c is to: TBD              Anticipated d/c date is: > 3 days              Patient currently is not medically stable to d/c.  Consultants:   Cardiology  pccm  Surgery   IR  Procedures:   Central line placement 10/26  Exp lap with omental patch 10/26 --07/07/20---IRSuccessful CT-guided left upper quadrant subdiaphragmatic abscess drain placement. Culture sent.  Antimicrobials:  Zosyn 10/26>> anidulafungin 11/2>>  Subjective: - -No new concerns, denies significant abdominal pain, no fevers, -   Objective: Vitals:   07/08/20 0601 07/08/20 2058 07/09/20 0131 07/09/20 0526  BP: (!) 176/83 (!) 132/102 (!) 131/58 (!) 143/100  Pulse: 64 83 69 68  Resp: _0 Temp: 98.4 F (36.9 C) 98.1 F (36.7 C) 98.5 F (36.9 C) 99.3 F (37.4 C)  TempSrc: Oral Oral Oral Oral  SpO2: 98% 93% 98% 98%  Weight:      Height:        Intake/Output Summary (Last 24 hours) at 07/09/2020 1429 Last data filed at 07/09/2020 1030 Gross per 24 hour  Intake 1404.77 ml  Output 15 ml  Net 1389.77 ml   Filed Weights   06/29/20 1700 07/02/20 0439 07/03/20 0400  Weight: 70.1 kg 70.5 kg 66.4 kg   Examination:  General exam: elderly chronically ill appearing male, awake, alert, NAD, Respiratory system: Clear to auscultation. Respiratory effort normal. No crackles or wheezes heard.  Cardiovascular system: normal S1 & S2 heard. No JVD,  No pedal edema. Gastrointestinal system: Abdomen is nondistended, soft and   wound clean and dry and intact.  hypoactive BS.  Anterior abdominal wall JP drain in place. -Left flank IR intra-abdominal drain in place with some purulent drainage Central nervous system: Alert and oriented to person only. No focal neurological deficits. Extremities: Pedal pulses are good. Skin: No rashes, lesions or ulcers Psychiatry: Affect is appropriate, mostly oriented occasional  episodes of frustration  Data Reviewed: I have personally reviewed following labs and imaging studies  CBC: Recent Labs  Lab 07/04/20 0507 07/06/20 0425 07/07/20 0511 07/08/20 0832 07/09/20 0734  WBC 15.8* 17.5* 19.7* 20.3* 16.9*  NEUTROABS 10.7* 13.3* 15.8* 15.4* 12.6*  HGB 9.2* 8.5* 9.2* 10.1* 8.8*  HCT 27.9* 25.3* 27.5* 31.0* 26.7*  MCV 92.4 93.0 92.0 93.9 92.1  PLT 368 488* 557* 622* 742*   Basic Metabolic Panel: Recent Labs  Lab 07/03/20 0523 07/03/20 0523 07/04/20 0507 07/05/20 0438 07/06/20 0425 07/07/20 0511 07/08/20 0832 07/09/20 0734  NA 137   < > 137  --  138 136 137 136  K 3.4*   < > 4.0  --  3.9 3.8 4.2 4.3  CL 102   < > 104  --  107 106 105 106  CO2 26   < > 24  --  22 21* 23 20*  GLUCOSE 105*   < > 118*  --  121* 134* 112* 127*  BUN 33*   < > 40*  --  40* 37* 36* 39*  CREATININE 1.21   < > 1.30*  --  1.36* 1.15 1.17 1.21  CALCIUM 8.3*   < > 8.4*  --  8.2* 8.4* 8.6* 8.4*  MG  --   --  2.0  --  2.2 2.3 2.4 2.3  PHOS 3.4  --  3.8 3.5  --  3.5  --   --    < > = values in this interval not displayed.   GFR: Estimated Creatinine Clearance: 45.7 mL/min (by C-G formula based on SCr of 1.21 mg/dL).  Liver Function Tests: Recent Labs  Lab 07/04/20 0507 07/06/20 0425 07/07/20 0511 07/08/20 0832 07/09/20 0734  AST 20 44* 51* 50* 43*  ALT 16 33 49* 55* 52*  ALKPHOS 99 160* 189* 221* 202*  BILITOT 0.9 0.5 0.6 0.7 0.6  PROT 5.5* 5.7* 6.1* 6.5 6.3*  ALBUMIN 1.9* 1.8* 2.0* 2.0* 2.0*    CBG: Recent Labs  Lab 07/08/20 0739 07/08/20 1627 07/08/20 2124 07/09/20 0610 07/09/20 0727  GLUCAP 117* 118* 112* 124* 116*    Recent Results (from the past 240 hour(s))  Aerobic/Anaerobic Culture (surgical/deep wound)     Status: None (Preliminary result)   Collection Time: 07/07/20  2:34 PM   Specimen: Abscess  Result Value Ref Range Status   Specimen Description   Final    ABSCESS ABDOMEN Performed at [facility], [place] [address], [place],  [place] [address]    Special Requests   Final    NONE Performed at [facility], [address], [place], [place] [address]    Gram Stain   Final    ABUNDANT WBC PRESENT,BOTH PMN AND MONONUCLEAR RARE GRAM POSITIVE COCCI    Culture   Final    CULTURE REINCUBATED FOR BETTER GROWTH Performed at [facility], [place] [address], [place], [place] [address]    Report Status PENDING  Incomplete     Radiology Studies: CT IMAGE GUIDED DRAINAGE BY PERCUTANEOUS CATHETER  Result Date: 07/07/2020 INDICATION: Postop left upper quadrant subdiaphragmatic fluid collection EXAM: CT DRAINAGE LEFT UPPER QUADRANT FLUID COLLECTION MEDICATIONS: The patient is currently admitted to the hospital and receiving intravenous antibiotics. The antibiotics were administered within an appropriate time frame prior to the initiation of the procedure. ANESTHESIA/SEDATION: Fentanyl 50 mcg IV; Versed 1.0 mg IV Moderate Sedation Time:  15 MINUTES The patient was continuously monitored during the procedure by the interventional radiology nurse under my direct supervision. COMPLICATIONS: None immediate. PROCEDURE: Informed written consent was obtained from the [facility] after a thorough discussion of the procedural risks, benefits and alternatives. All questions were addressed. Maximal Sterile Barrier Technique was utilized including caps, mask, sterile gowns, sterile gloves, sterile drape, hand hygiene and skin antiseptic. A timeout was performed prior to the initiation of the procedure. Previous imaging reviewed. Left upper quadrant perisplenic subdiaphragmatic fluid collection was localized. Overlying skin marked for a lower intercostal approach. Under sterile conditions and local anesthesia, an 18 gauge needle was advanced from a lateral lower intercostal approach. Needle position confirmed with CT. Syringe aspiration yielded purulent fluid. Sample sent for culture. Guidewire inserted followed by tract dilatation insert a 10 French  drain. Drain catheter position confirmed within the collection by CT. Catheter secured with a Prolene suture. Suction bulb connected. Sterile dressing applied. 20 cc purulent fluid aspirated. IMPRESSION: Successful CT-guided left upper quadrant subdiaphragmatic abscess drain placement. Culture sent. Electronically Signed   By: Jerilynn Mages.  Shick M.D.   On: 07/07/2020 15:22   Scheduled Meds: . chlorhexidine gluconate (MEDLINE KIT)  15 mL Mouth Rinse BID  . Chlorhexidine Gluconate Cloth  6 each  Topical Daily  . insulin aspart  0-9 Units Subcutaneous Q8H  . mouth rinse  15 mL Mouth Rinse BID  . nystatin  5 mL Mouth/Throat QID  . pantoprazole (PROTONIX) IV  40 mg Intravenous Q12H  . sodium chloride flush  10-40 mL Intracatheter Q12H   Continuous Infusions: . anidulafungin 100 mg (07/09/20 1005)  . piperacillin-tazobactam (ZOSYN)  IV 3.375 g (07/09/20 1307)  . TPN ADULT (ION) 80 mL/hr at 07/08/20 1759  . TPN ADULT (ION)      LOS: 11 days    , MD.  07/09/2020, 2:29 PM  

## 2020-07-09 NOTE — Progress Notes (Signed)
11 Days Post-Op  Subjective: Patient has no complaints.  Objective: Vital signs in last 24 hours: Temp:  [98.1 F (36.7 C)-99.3 F (37.4 C)] 99.3 F (37.4 C) (11/06 0526) Pulse Rate:  [68-83] 68 (11/06 0526) Resp:  [18-20] 19 (11/06 0526) BP: (131-143)/(58-102) 143/100 (11/06 0526) SpO2:  [93 %-98 %] 98 % (11/06 0526) Last BM Date: 07/04/20  Intake/Output from previous day: 11/05 0701 - 11/06 0700 In: 1394.8 [I.V.:1394.8] Out: 15 [Drains:15] Intake/Output this shift: No intake/output data recorded.  General appearance: alert, cooperative and no distress GI: Soft, incision healing well. No significant bilious drainage noted in JP drain. Purulent fluid noted in IR drain.  Lab Results:  Recent Labs    07/08/20 0832 07/09/20 0734  WBC 20.3* 16.9*  HGB 10.1* 8.8*  HCT 31.0* 26.7*  PLT 622* 633*   BMET Recent Labs    07/08/20 0832 07/09/20 0734  NA 137 136  K 4.2 4.3  CL 105 106  CO2 23 20*  GLUCOSE 112* 127*  BUN 36* 39*  CREATININE 1.17 1.21  CALCIUM 8.6* 8.4*   PT/INR Recent Labs    07/06/20 1336  LABPROT 16.1*  INR 1.3*    Studies/Results: CT IMAGE GUIDED DRAINAGE BY PERCUTANEOUS CATHETER  Result Date: 07/07/2020 INDICATION: Postop left upper quadrant subdiaphragmatic fluid collection EXAM: CT DRAINAGE LEFT UPPER QUADRANT FLUID COLLECTION MEDICATIONS: The patient is currently admitted to the hospital and receiving intravenous antibiotics. The antibiotics were administered within an appropriate time frame prior to the initiation of the procedure. ANESTHESIA/SEDATION: Fentanyl 50 mcg IV; Versed 1.0 mg IV Moderate Sedation Time:  15 MINUTES The patient was continuously monitored during the procedure by the interventional radiology nurse under my direct supervision. COMPLICATIONS: None immediate. PROCEDURE: Informed written consent was obtained from the [facility] after a thorough discussion of the procedural risks, benefits and alternatives. All  questions were addressed. Maximal Sterile Barrier Technique was utilized including caps, mask, sterile gowns, sterile gloves, sterile drape, hand hygiene and skin antiseptic. A timeout was performed prior to the initiation of the procedure. Previous imaging reviewed. Left upper quadrant perisplenic subdiaphragmatic fluid collection was localized. Overlying skin marked for a lower intercostal approach. Under sterile conditions and local anesthesia, an 18 gauge needle was advanced from a lateral lower intercostal approach. Needle position confirmed with CT. Syringe aspiration yielded purulent fluid. Sample sent for culture. Guidewire inserted followed by tract dilatation insert a 10 French drain. Drain catheter position confirmed within the collection by CT. Catheter secured with a Prolene suture. Suction bulb connected. Sterile dressing applied. 20 cc purulent fluid aspirated. IMPRESSION: Successful CT-guided left upper quadrant subdiaphragmatic abscess drain placement. Culture sent. Electronically Signed   By: Jerilynn Mages.  Shick M.D.   On: 07/07/2020 15:22    Anti-infectives: Anti-infectives (From admission, onward)   Start     Dose/Rate Route Frequency Ordered Stop   07/06/20 1015  anidulafungin (ERAXIS) 100 mg in sodium chloride 0.9 % 100 mL IVPB       "Followed by" Linked Group Details   100 mg 78 mL/hr over 100 Minutes Intravenous Every 24 hours 07/05/20 0922     07/05/20 1015  anidulafungin (ERAXIS) 200 mg in sodium chloride 0.9 % 200 mL IVPB       "Followed by" Linked Group Details   200 mg 78 mL/hr over 200 Minutes Intravenous  Once 07/05/20 0922 07/05/20 1659   07/04/20 1700  piperacillin-tazobactam (ZOSYN) IVPB 3.375 g        3.375 g 12.5 mL/hr over  240 Minutes Intravenous Every 8 hours 07/04/20 1055     07/01/20 1700  piperacillin-tazobactam (ZOSYN) IVPB 3.375 g  Status:  Discontinued        3.375 g 12.5 mL/hr over 240 Minutes Intravenous Every 8 hours 07/01/20 1248 07/04/20 1055   07/01/20 1400   tetracycline (SUMYCIN) capsule 500 mg  Status:  Discontinued       Note to Pharmacy: Empiric h pylori treatment   500 mg Oral 4 times daily 07/01/20 1203 07/01/20 1236   07/01/20 1400  metroNIDAZOLE (FLAGYL) tablet 250 mg  Status:  Discontinued       Note to Pharmacy: Empiric h pylori treatment   250 mg Oral 4 times daily 07/01/20 1203 07/01/20 1236   06/29/20 1000  piperacillin-tazobactam (ZOSYN) IVPB 3.375 g  Status:  Discontinued        3.375 g 12.5 mL/hr over 240 Minutes Intravenous Every 8 hours 06/29/20 0907 07/01/20 1205   06/28/20 2200  piperacillin-tazobactam (ZOSYN) IVPB 3.375 g  Status:  Discontinued        3.375 g 12.5 mL/hr over 240 Minutes Intravenous Every 12 hours 06/28/20 1407 06/29/20 0907   06/28/20 0600  piperacillin-tazobactam (ZOSYN) IVPB 3.375 g  Status:  Discontinued        3.375 g 12.5 mL/hr over 240 Minutes Intravenous Every 8 hours 06/28/20 0412 06/28/20 1407   06/28/20 0130  cefoTEtan (CEFOTAN) 2 g in sodium chloride 0.9 % 100 mL IVPB        2 g 200 mL/hr over 30 Minutes Intravenous On call to O.R. 06/27/20 2323 06/28/20 0200   06/27/20 2215  piperacillin-tazobactam (ZOSYN) IVPB 3.375 g        3.375 g 100 mL/hr over 30 Minutes Intravenous  Once 06/27/20 2203 06/27/20 2305      Assessment/Plan: s/p Procedure(s): EXPLORATORY LAPAROTOMY, graham patch Imp: Bilious drainage seems to have decreased from JP drain. Final ID from percutaneous drainage pending. Continue current management.  LOS: 11 days    Aviva Signs 07/09/2020

## 2020-07-09 NOTE — Progress Notes (Addendum)
PHARMACY - TOTAL PARENTERAL NUTRITION CONSULT NOTE    Indication: gastric perforation  Patient Measurements: Height: _0  (172.7 cm) Weight: 66.4 kg (146 lb 6.2 oz) IBW/kg (Calculated) : 68.4 TPN AdjBW (KG): 63.5 Body mass index is 22.26 kg/m. Usual Weight:    Assessment: Per RD note: Inadequate oral intake related to acute illness (gastric ulcer perforation s/p ex-lap with omental patch) as evidenced by NPO status.    Antibiotics: 11/5   day #12 out of a total of  14 days of  Zosyn and day #4of anidulafungin  Drain is growing GPC  Glucose / Insulin: 116127-->  SSI q8h sensitive scale --> no insulin in past 24 hrs  Electrolytes:  Na 137   K 4.3   Cl 106   Ca  8.4    Phos 3.5    Mg 2.3  Renal:  Scr 1.36>1.15>1.17 LFTs:   ALT 52     AST 43      Alk phos 202 TG: 101 Prealbumin:  6.0 Albumin: 2.0 Intake / Output:   Intake/Output Summary (Last 24 hours) at 07/09/2020 1101 Last data filed at 07/09/2020 0900 Gross per 24 hour  Intake 1394.77 ml  Output 15 ml  Net 1379.77 ml    GI Imaging:  11/3  CT abd/pelvis found small leak  -->repeat CT on Wednesday, 11-10 Surgeries / Procedures:   s/p exp laparotomy w/ omental patch for perforated gastric ulcer  Central access:  CVC Triple Lumen placed 06/28/20 -->rt internal jugular 11/4 drain placed    TPN start date:  06-30-20 at 1800  Nutritional Goals (per RD rec  on 06-30-20):  kCal: 6754-4920  Protein: 84-95g  Fluid: >1700 mL Goal TPN rate is 80 mL/hr (provides 92 g of protein and ~1827 kcals per day)  Current Nutrition:  TPN per central line   Plan:  Continue TPN at 80 mL/hr at 1800  Electrolytes in TPN: 50mq/L of Na, 5460m/L of K, 60m41mL of Ca, 60mE61m of Mg, and 20 mmol/L of Phos.  Cl:Ac ratio 1:1 Standard MVI and trace elements added  to TPN sliding scale to sensitive q8h and adjust as needed  Monitor TPN labs on Mon/Thurs   TamaDespina Polearm. D. Clinical Pharmacist 07/09/2020 11:01 AM

## 2020-07-09 NOTE — Progress Notes (Signed)
JP tube on left side milked and emptied of 73ml of creamy white/tan colored thick drainage. Tube then flushed with 72ml of sterile saline per order. Insertion site clean and dry, dressing changed per order. JP tube on right side milked and emptied of 36ml of tan/brown colored drainage. Insertion site clean and dry, no dressing in place.

## 2020-07-10 LAB — MAGNESIUM: Magnesium: 2.3 mg/dL (ref 1.7–2.4)

## 2020-07-10 LAB — CBC WITH DIFFERENTIAL/PLATELET
Abs Immature Granulocytes: 0.23 10*3/uL — ABNORMAL HIGH (ref 0.00–0.07)
Basophils Absolute: 0.1 10*3/uL (ref 0.0–0.1)
Basophils Relative: 1 %
Eosinophils Absolute: 0.6 10*3/uL — ABNORMAL HIGH (ref 0.0–0.5)
Eosinophils Relative: 4 %
HCT: 27.3 % — ABNORMAL LOW (ref 39.0–52.0)
Hemoglobin: 8.8 g/dL — ABNORMAL LOW (ref 13.0–17.0)
Immature Granulocytes: 2 %
Lymphocytes Relative: 17 %
Lymphs Abs: 2.6 10*3/uL (ref 0.7–4.0)
MCH: 29.9 pg (ref 26.0–34.0)
MCHC: 32.2 g/dL (ref 30.0–36.0)
MCV: 92.9 fL (ref 80.0–100.0)
Monocytes Absolute: 0.8 10*3/uL (ref 0.1–1.0)
Monocytes Relative: 5 %
Neutro Abs: 11.1 10*3/uL — ABNORMAL HIGH (ref 1.7–7.7)
Neutrophils Relative %: 71 %
Platelets: 656 10*3/uL — ABNORMAL HIGH (ref 150–400)
RBC: 2.94 MIL/uL — ABNORMAL LOW (ref 4.22–5.81)
RDW: 15 % (ref 11.5–15.5)
WBC: 15.7 10*3/uL — ABNORMAL HIGH (ref 4.0–10.5)
nRBC: 0 % (ref 0.0–0.2)

## 2020-07-10 LAB — COMPREHENSIVE METABOLIC PANEL
ALT: 63 U/L — ABNORMAL HIGH (ref 0–44)
AST: 52 U/L — ABNORMAL HIGH (ref 15–41)
Albumin: 2 g/dL — ABNORMAL LOW (ref 3.5–5.0)
Alkaline Phosphatase: 204 U/L — ABNORMAL HIGH (ref 38–126)
Anion gap: 9 (ref 5–15)
BUN: 38 mg/dL — ABNORMAL HIGH (ref 8–23)
CO2: 21 mmol/L — ABNORMAL LOW (ref 22–32)
Calcium: 8.6 mg/dL — ABNORMAL LOW (ref 8.9–10.3)
Chloride: 106 mmol/L (ref 98–111)
Creatinine, Ser: 1.18 mg/dL (ref 0.61–1.24)
GFR, Estimated: >60 mL/min (ref >60)
Glucose, Bld: 120 mg/dL — ABNORMAL HIGH (ref 70–99)
Potassium: 4.4 mmol/L (ref 3.5–5.1)
Sodium: 136 mmol/L (ref 135–145)
Total Bilirubin: 0.6 mg/dL (ref 0.3–1.2)
Total Protein: 6.6 g/dL (ref 6.5–8.1)

## 2020-07-10 LAB — GLUCOSE, CAPILLARY
Glucose-Capillary: 114 mg/dL — ABNORMAL HIGH (ref 70–99)
Glucose-Capillary: 112 mg/dL — ABNORMAL HIGH (ref 70–99)
Glucose-Capillary: 107 mg/dL — ABNORMAL HIGH (ref 70–99)

## 2020-07-10 MED ORDER — TRAVASOL 10 % IV SOLN
INTRAVENOUS | Status: AC
  Filled 2020-07-10: qty 921.6

## 2020-07-10 NOTE — Plan of Care (Signed)

## 2020-07-10 NOTE — Progress Notes (Signed)
PROGRESS NOTE   Frederick [name]  DGU:[id number] DOB: [birth year] DOA: 06/27/2020 PCP: Patient, No Pcp Per   Chief Complaint  Patient presents with  . Abdominal Pain   Brief Admission History:  79 y.o. male with no documented chronic medical problems presenting with 2-week history of abdominal pain that was intermittent in nature, occasionally worse with food.  The patient has not seen a physician for nearly 5 years.  He does not take any prescription medications.  Apparently, the patient ate some chocolate on 06/27/2020 after which he had unrelenting epigastric abdominal pain.  There was some nausea without any emesis.  The patient denies any NSAIDs.  Because of his abdominal pain, the patient presented for further evaluation.  He had denied any chest pain, shortness breath, diarrhea, dysuria.  In the emergency department, the patient was afebrile with soft blood pressures.  CT of the abdomen and pelvis showed moderate free air in the upper abdomen with a small amount of free fluid consistent with perforated viscus.  It was suspected that this was likely in the prepyloric region of the stomach.  General surgery was consulted.  The patient was taken to the operating room and underwent an exploratory laparotomy with vascular omental patch placement.  Unfortunately, there was difficulty extubating the patient postoperatively.  On the morning of 06/28/2020, the patient began developing hypotension.  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    Assessment & Plan:   Principal Problem:   Gastric perforation, acute Active Problems:   Gastric perforation (HCC)   Acute respiratory failure with hypoxia (HCC)   Hyperkalemia   AKI (acute kidney injury) ([employer])   Complete heart block (HCC)   Hypotension   Endotracheally intubated   Perforated abdominal viscus   1)Gastric Ulcer Perforation-- s/p Exploratory Laparotomy, vascularized omental patch  by Dr. Constance Haw on 06/28/2020 .C/n   protonix  -Repeat CT abdomen and pelvis from 07/06/2020 reviewed with radiology. Small leak remains patient continues to have bilious output from his JP drain, getting smaller and some gas in area and LUQ collection. Leukocytosis going up  -07/07/20-Pt underwent successful CT-guided left upper quadrant subdiaphragmatic abscess drain placement at [facility]. --Drain culture from 07/07/2020 with GPC-ID and sensitivity pending -- --continue IV Zosyn any further ID and sensitivity 07/10/20 -Very scant amount of purulent drainage from the left flank IR drain tube- -No further  bilious drainage from anterior abdominal JP drain  2)Septic Shock.  Hypotension - resolved now.  He was briefly on dobutamine infusion which is now discontinued. -Hemodynamically more stable -WBC is down to 15.7 from a peak of 20.3  3)Candida Glabrata growing in peritoneal fluid - c/n anidulafungin IV started 07/05/20.   4)Hypertension - IV hydralazine ordered.   5)Hypokalemia / Hypophos - IV replacement, recheck   6)AV block - Appreciate cardiology consultation. Follow for now.  Avoiding beta blockers.  Arrange home cardiac monitor at discharge.    7)Stage 4 CKD - creatinine improved with IV fluids and supportive measures.   -Creatinine normalized with hydration  8)Acute delirium -a lot more oriented, haldol IV prn.  Delirium precautions.   9)FEN--patient remains n.p.o. until biliary leak and associated intra-abdominal abscess improves/resolves, -- continue TPN (started 06/30/20), IV zosyn and IV antifungal  10)Social/Ethics--Rhonda is patient's primary decision maker,, patient remains a full code with full scope of treatment   DVT prophylaxis: sQ heparin  Code Status:  Full  Family Communication:   HCPOA/Ronda updated at bedside 11/4 Disposition: TBD  Status is: Inpatient  Remains inpatient appropriate because:Currently n.p.o. on IV TPN due to biliary leak and intra-abdominal abscess, requiring IV antibiotics and  IV antifungal agents pending final culture results.  Pt remains on TPN.   Dispo: The patient is from: Home              Anticipated d/c is to: TBD              Anticipated d/c date is: > 3 days              Patient currently is not medically stable to d/c.  Consultants:   Cardiology  pccm  Surgery   IR  Procedures:   Central line placement 10/26  Exp lap with omental patch 10/26 --07/07/20---IRSuccessful CT-guided left upper quadrant subdiaphragmatic abscess drain placement. Culture sent.  Antimicrobials:  Zosyn 10/26>> anidulafungin 11/2>>  Subjective: - -No new concerns, denies significant abdominal pain, no fevers, -Patient was out of bed with assistance and walker -   Objective: Vitals:   07/09/20 0526 07/09/20 2100 07/10/20 0600 07/10/20 1436  BP: (!) 143/100 (!) 155/71 (!) 169/89 (!) 147/78  Pulse: 68 70 80 70  Resp: 19 19 18 19   Temp: 99.3 F (37.4 C) 97.9 F (36.6 C) 98.4 F (36.9 C) 98.4 F (36.9 C)  TempSrc: Oral Oral Oral   SpO2: 98%   99%  Weight:      Height:        Intake/Output Summary (Last 24 hours) at 07/10/2020 1600 Last data filed at 07/10/2020 1500 Gross per 24 hour  Intake 680.35 ml  Output 1570 ml  Net -889.65 ml   Filed Weights   06/29/20 1700 07/02/20 0439 07/03/20 0400  Weight: 70.1 kg 70.5 kg 66.4 kg   Examination:  General exam: elderly chronically ill appearing male, awake, alert, NAD, Respiratory system: Clear to auscultation. Respiratory effort normal. No crackles or wheezes heard.  Cardiovascular system: normal S1 & S2 heard. No JVD,  No pedal edema. Gastrointestinal system: Abdomen is nondistended, soft and   wound clean and dry and intact.  hypoactive BS.  Anterior abdominal wall JP drain in place without further bilious drainage -Left flank IR intra-abdominal drain in place with very scant purulent drainage Central nervous system: Alert and oriented , generalized weakness . No focal neurological  deficits. Extremities: Pedal pulses are good. Skin: No rashes, lesions or ulcers Psychiatry: Affect is appropriate, mostly oriented occasional episodes of frustration  Data Reviewed: I have personally reviewed following labs and imaging studies  CBC: Recent Labs  Lab 07/06/20 0425 07/07/20 0511 07/08/20 0832 07/09/20 0734 07/10/20 0852  WBC 17.5* 19.7* 20.3* 16.9* 15.7*  NEUTROABS 13.3* 15.8* 15.4* 12.6* 11.1*  HGB 8.5* 9.2* 10.1* 8.8* 8.8*  HCT 25.3* 27.5* 31.0* 26.7* 27.3*  MCV 93.0 92.0 93.9 92.1 92.9  PLT 488* 557* 622* 633* 482*   Basic Metabolic Panel: Recent Labs  Lab 07/04/20 0507 07/04/20 0507 07/05/20 0438 07/06/20 0425 07/07/20 0511 07/08/20 0832 07/09/20 0734 07/10/20 0852  NA 137   < >  --  138 136 137 136 136  K 4.0   < >  --  3.9 3.8 4.2 4.3 4.4  CL 104   < >  --  107 106 105 106 106  CO2 24   < >  --  22 21* 23 20* 21*  GLUCOSE 118*   < >  --  121* 134* 112* 127* 120*  BUN 40*   < >  --  40* 37* 36* 39* 38*  CREATININE 1.30*   < >  --  1.36* 1.15 1.17 1.21 1.18  CALCIUM 8.4*   < >  --  8.2* 8.4* 8.6* 8.4* 8.6*  MG 2.0   < >  --  2.2 2.3 2.4 2.3 2.3  PHOS 3.8  --  3.5  --  3.5  --   --   --    < > = values in this interval not displayed.   GFR: Estimated Creatinine Clearance: 46.9 mL/min (by C-G formula based on SCr of 1.18 mg/dL).  Liver Function Tests: Recent Labs  Lab 07/06/20 0425 07/07/20 0511 07/08/20 0832 07/09/20 0734 07/10/20 0852  AST 44* 51* 50* 43* 52*  ALT 33 49* 55* 52* 63*  ALKPHOS 160* 189* 221* 202* 204*  BILITOT 0.5 0.6 0.7 0.6 0.6  PROT 5.7* 6.1* 6.5 6.3* 6.6  ALBUMIN 1.8* 2.0* 2.0* 2.0* 2.0*    CBG: Recent Labs  Lab 07/08/20 2124 07/09/20 0610 07/09/20 0727 07/09/20 1601 07/10/20 0736  GLUCAP 112* 124* 116* 114* 114*    Recent Results (from the past 240 hour(s))  Aerobic/Anaerobic Culture (surgical/deep wound)     Status: None (Preliminary result)   Collection Time: 07/07/20  2:34 PM   Specimen: Abscess   Result Value Ref Range Status   Specimen Description   Final    ABSCESS ABDOMEN Performed at [facility], [place] [address], [place], [place] [address]    Special Requests   Final    NONE Performed at [facility], [address], [place], [place] [address]    Gram Stain   Final    ABUNDANT WBC PRESENT,BOTH PMN AND MONONUCLEAR RARE GRAM POSITIVE COCCI    Culture   Final    CULTURE REINCUBATED FOR BETTER GROWTH Performed at [facility], [place] [address], [place], [place] [address]    Report Status PENDING  Incomplete     Radiology Studies: No results found. Scheduled Meds: . chlorhexidine gluconate (MEDLINE KIT)  15 mL Mouth Rinse BID  . Chlorhexidine Gluconate Cloth  6 each Topical Daily  . insulin aspart  0-9 Units Subcutaneous Q8H  . mouth rinse  15 mL Mouth Rinse BID  . nystatin  5 mL Mouth/Throat QID  . pantoprazole (PROTONIX) IV  40 mg Intravenous Q12H  . sodium chloride flush  10-40 mL Intracatheter Q12H  . sodium chloride flush  5 mL Intracatheter Q8H   Continuous Infusions: . anidulafungin 100 mg (07/10/20 1111)  . piperacillin-tazobactam (ZOSYN)  IV 3.375 g (07/10/20 1514)  . TPN ADULT (ION) 80 mL/hr at 07/09/20 1932  . TPN ADULT (ION)      LOS: 12 days   Roxan Hockey, MD.  07/10/2020, 4:00 PM

## 2020-07-10 NOTE — Progress Notes (Addendum)
Pt assisted back to bed using walker and assist x1. Tolerated well, only took short/suffling steps but able to bear weight better than this am. Pt c/o pain to left heel area. Sock removed and serosanguinous drainage noted from 3cm x 3xm round blistered area on heel. Pt stated started hurting today, this area was not noted on this am's assessment. Area cleaned, designated as deep tissue injury. Foam dressing applied (previous foam removed this am with bath). Left heel floated on 2 pillows. Right heel with no pain, no reddness, no bogginess of area. Heel protector foam reapplied to this heel as well.

## 2020-07-10 NOTE — Progress Notes (Addendum)
PHARMACY - TOTAL PARENTERAL NUTRITION CONSULT NOTE    Indication: gastric perforation  Patient Measurements: Height: 5' 8"  (172.7 cm) Weight: 66.4 kg (146 lb 6.2 oz) IBW/kg (Calculated) : 68.4 TPN AdjBW (KG): 63.5 Body mass index is 22.26 kg/m. Usual Weight:    Assessment: Per RD note: Inadequate oral intake related to acute illness (gastric ulcer perforation s/p ex-lap with omental patch) as evidenced by NPO status.    Antibiotics: 11/7   day #13   of  Zosyn  out of a total of  14 days of  Zosyn and day #5 of anidulafungin     Glucose / Insulin: 114-->  SSI q8h sensitive scale --> no insulin in past 24 hrs  Electrolytes:  Na 137   K 4.3   Cl 106   Ca  8.4    Phos 3.5    Mg 2.3  Renal:  Scr 1.36>1.15>1.17 LFTs:   ALT 52     AST 43      Alk phos 202 TG: 101 Prealbumin:  6.0 Albumin: 2.0 Intake / Output:   Intake/Output Summary (Last 24 hours) at 07/10/2020 0736 Last data filed at 07/10/2020 0600 Gross per 24 hour  Intake 685.35 ml  Output 1570 ml  Net -884.65 ml    GI Imaging:  11/3  CT abd/pelvis found small leak  -->repeat CT on Wednesday, 11-10 Surgeries / Procedures:   s/p exp laparotomy w/ omental patch for perforated gastric ulcer  Central access: 11/3  single lumen PICC line in left upper arm.  TPN start date:  06-30-20 at 1800  Nutritional Goals (per RD rec  on 06-30-20):  kCal: 2429-9806  Protein: 84-95g  Fluid: >1700 mL Goal TPN rate is 80 mL/hr (provides 92 g of protein and ~1827 kcals per day)  Current Nutrition:  TPN   Plan:  Continue TPN at 80 mL/hr at 1800  Electrolytes in TPN: 77mq/L of Na, 590m/L of K, 45m545mL of Ca, 45mE63m of Mg, and 20 mmol/L of Phos.  Cl:Ac ratio 1:1 Standard MVI and trace elements added  to TPN sliding scale to sensitive q8h and adjust as needed  Monitor TPN labs on Mon/Thurs   TamaDespina Polearm. D. Clinical Pharmacist 07/10/2020 7:36 AM

## 2020-07-10 NOTE — Progress Notes (Signed)
Pt had moderate incontinent brown/green, sticky, mucousy stool. Pt cleaned, bed linens changed. Pt assisted up OOB and ambulated 5 small steps to chair using FWW and two standby assists. Pt had to be reminded to tuck his bottom, hold up his head, and stand up straight but tolerated well. Chair alarm in place. Family member at bedside. Pt A&O but still has episodes of confused conversation but easily oriented. Denies c/o pain or SOB. TPN infusing via PICC line without difficulty or s/s infiltration or dislodgement.

## 2020-07-10 NOTE — Progress Notes (Signed)
12 Days Post-Op  Subjective: Resting comfortably.  Objective: Vital signs in last 24 hours: Temp:  [97.9 F (36.6 C)-98.4 F (36.9 C)] 98.4 F (36.9 C) (11/07 0600) Pulse Rate:  [70-80] 80 (11/07 0600) Resp:  [18-19] 18 (11/07 0600) BP: (155-169)/(71-89) 169/89 (11/07 0600) Last BM Date: 07/04/20  Intake/Output from previous day: 11/06 0701 - 11/07 0700 In: 685.4 [I.V.:685.4] Out: 3762 [Urine:1550; Drains:20] Intake/Output this shift: No intake/output data recorded.  General appearance: no distress GI: Soft, incision healing well.  Minimal cloudy drainage in JP bulb, no bile.  Lateral drain with minimal purulent drainage.  Lab Results:  Recent Labs    07/08/20 0832 07/09/20 0734  WBC 20.3* 16.9*  HGB 10.1* 8.8*  HCT 31.0* 26.7*  PLT 622* 633*   BMET Recent Labs    07/08/20 0832 07/09/20 0734  NA 137 136  K 4.2 4.3  CL 105 106  CO2 23 20*  GLUCOSE 112* 127*  BUN 36* 39*  CREATININE 1.17 1.21  CALCIUM 8.6* 8.4*   PT/INR No results for input(s): LABPROT, INR in the last 72 hours.  Studies/Results: No results found.  Anti-infectives: Anti-infectives (From admission, onward)   Start     Dose/Rate Route Frequency Ordered Stop   07/06/20 1015  anidulafungin (ERAXIS) 100 mg in sodium chloride 0.9 % 100 mL IVPB       "Followed by" Linked Group Details   100 mg 78 mL/hr over 100 Minutes Intravenous Every 24 hours 07/05/20 0922     07/05/20 1015  anidulafungin (ERAXIS) 200 mg in sodium chloride 0.9 % 200 mL IVPB       "Followed by" Linked Group Details   200 mg 78 mL/hr over 200 Minutes Intravenous  Once 07/05/20 0922 07/05/20 1659   07/04/20 1700  piperacillin-tazobactam (ZOSYN) IVPB 3.375 g        3.375 g 12.5 mL/hr over 240 Minutes Intravenous Every 8 hours 07/04/20 1055     07/01/20 1700  piperacillin-tazobactam (ZOSYN) IVPB 3.375 g  Status:  Discontinued        3.375 g 12.5 mL/hr over 240 Minutes Intravenous Every 8 hours 07/01/20 1248 07/04/20 1055    07/01/20 1400  tetracycline (SUMYCIN) capsule 500 mg  Status:  Discontinued       Note to Pharmacy: Empiric h pylori treatment   500 mg Oral 4 times daily 07/01/20 1203 07/01/20 1236   07/01/20 1400  metroNIDAZOLE (FLAGYL) tablet 250 mg  Status:  Discontinued       Note to Pharmacy: Empiric h pylori treatment   250 mg Oral 4 times daily 07/01/20 1203 07/01/20 1236   06/29/20 1000  piperacillin-tazobactam (ZOSYN) IVPB 3.375 g  Status:  Discontinued        3.375 g 12.5 mL/hr over 240 Minutes Intravenous Every 8 hours 06/29/20 0907 07/01/20 1205   06/28/20 2200  piperacillin-tazobactam (ZOSYN) IVPB 3.375 g  Status:  Discontinued        3.375 g 12.5 mL/hr over 240 Minutes Intravenous Every 12 hours 06/28/20 1407 06/29/20 0907   06/28/20 0600  piperacillin-tazobactam (ZOSYN) IVPB 3.375 g  Status:  Discontinued        3.375 g 12.5 mL/hr over 240 Minutes Intravenous Every 8 hours 06/28/20 0412 06/28/20 1407   06/28/20 0130  cefoTEtan (CEFOTAN) 2 g in sodium chloride 0.9 % 100 mL IVPB        2 g 200 mL/hr over 30 Minutes Intravenous On call to O.R. 06/27/20 2323 06/28/20 0200   06/27/20 2215  piperacillin-tazobactam (ZOSYN) IVPB 3.375 g        3.375 g 100 mL/hr over 30 Minutes Intravenous  Once 06/27/20 2203 06/27/20 2305      Assessment/Plan: s/p Procedure(s): EXPLORATORY LAPAROTOMY, graham patch Impression: There has been no bilious drainage noted this weekend.  ID of culture still pending.  White count pending.  Continue current management.  LOS: 12 days    Aviva Signs 07/10/2020

## 2020-07-11 ENCOUNTER — Encounter (HOSPITAL_COMMUNITY): Payer: Self-pay | Admitting: General Surgery

## 2020-07-11 LAB — DIFFERENTIAL
Abs Immature Granulocytes: 0 10*3/uL (ref 0.00–0.07)
Band Neutrophils: 0 %
Basophils Absolute: 0 10*3/uL (ref 0.0–0.1)
Basophils Relative: 0 %
Blasts: 0 %
Eosinophils Absolute: 0.5 10*3/uL (ref 0.0–0.5)
Eosinophils Relative: 3 %
Lymphocytes Relative: 11 %
Lymphs Abs: 1.7 10*3/uL (ref 0.7–4.0)
Metamyelocytes Relative: 0 %
Monocytes Absolute: 0.8 10*3/uL (ref 0.1–1.0)
Monocytes Relative: 5 %
Myelocytes: 0 %
Neutro Abs: 12 10*3/uL — ABNORMAL HIGH (ref 1.7–7.7)
Neutrophils Relative %: 81 %
Other: 0 %
Promyelocytes Relative: 0 %
nRBC: 0 /100 WBC

## 2020-07-11 LAB — COMPREHENSIVE METABOLIC PANEL
ALT: 68 U/L — ABNORMAL HIGH (ref 0–44)
AST: 54 U/L — ABNORMAL HIGH (ref 15–41)
Albumin: 2.1 g/dL — ABNORMAL LOW (ref 3.5–5.0)
Alkaline Phosphatase: 207 U/L — ABNORMAL HIGH (ref 38–126)
Anion gap: 9 (ref 5–15)
BUN: 40 mg/dL — ABNORMAL HIGH (ref 8–23)
CO2: 21 mmol/L — ABNORMAL LOW (ref 22–32)
Calcium: 8.6 mg/dL — ABNORMAL LOW (ref 8.9–10.3)
Chloride: 105 mmol/L (ref 98–111)
Creatinine, Ser: 1.2 mg/dL (ref 0.61–1.24)
GFR, Estimated: >60 mL/min (ref >60)
Glucose, Bld: 128 mg/dL — ABNORMAL HIGH (ref 70–99)
Potassium: 4.7 mmol/L (ref 3.5–5.1)
Sodium: 135 mmol/L (ref 135–145)
Total Bilirubin: 0.5 mg/dL (ref 0.3–1.2)
Total Protein: 6.7 g/dL (ref 6.5–8.1)

## 2020-07-11 LAB — GLUCOSE, CAPILLARY
Glucose-Capillary: 125 mg/dL — ABNORMAL HIGH (ref 70–99)
Glucose-Capillary: 132 mg/dL — ABNORMAL HIGH (ref 70–99)
Glucose-Capillary: 128 mg/dL — ABNORMAL HIGH (ref 70–99)
Glucose-Capillary: 127 mg/dL — ABNORMAL HIGH (ref 70–99)

## 2020-07-11 LAB — CBC
HCT: 28.2 % — ABNORMAL LOW (ref 39.0–52.0)
Hemoglobin: 9.2 g/dL — ABNORMAL LOW (ref 13.0–17.0)
MCH: 30.4 pg (ref 26.0–34.0)
MCHC: 32.6 g/dL (ref 30.0–36.0)
MCV: 93.1 fL (ref 80.0–100.0)
Platelets: 645 10*3/uL — ABNORMAL HIGH (ref 150–400)
RBC: 3.03 MIL/uL — ABNORMAL LOW (ref 4.22–5.81)
RDW: 15.2 % (ref 11.5–15.5)
WBC: 15 10*3/uL — ABNORMAL HIGH (ref 4.0–10.5)
nRBC: 0 % (ref 0.0–0.2)

## 2020-07-11 LAB — TRIGLYCERIDES: Triglycerides: 82 mg/dL (ref <150)

## 2020-07-11 LAB — PHOSPHORUS: Phosphorus: 3.9 mg/dL (ref 2.5–4.6)

## 2020-07-11 LAB — PREALBUMIN: Prealbumin: 15.5 mg/dL — ABNORMAL LOW (ref 18–38)

## 2020-07-11 MED ORDER — TRAVASOL 10 % IV SOLN
INTRAVENOUS | Status: AC
  Filled 2020-07-11: qty 921.6

## 2020-07-11 NOTE — Progress Notes (Addendum)
I was present with the medical student for this service. I personally verified the history of present illness, performed the physical exam, and made the plan for this encounter. I have verified the medical student's documentation and made modifications where appropriately. I have personally documented in my own words a brief history, physical, and plan below.      Doing well and no pain complaints. Drain with minimal brown fluid. No suds. Stripped tubing and yellow/ white material. Lateral drain with serous fluid and some tissue.  WBC coming down. Plan for CT on Wednesday versus early if he gets a SNF bed sooner. TPN and antibiotics for now. Will make sure final cultures back before discontinuing antibiotics.  Curlene Labrum, MD [facility] [address] [place], [place] [address] [phone] (office)  [facility] Progress Note  13 Days Post-Op  Subjective: Pt resting comfortably this AM. No ON events.  Objective: Vital signs in last 24 hours: Temp:  [98.4 F (36.9 C)-98.7 F (37.1 C)] 98.7 F (37.1 C) (11/08 0400) Pulse Rate:  [70-75] 75 (11/08 0400) Resp:  [18-19] 18 (11/08 0400) BP: (147-165)/(78) 165/78 (11/08 0400) SpO2:  [99 %] 99 % (11/08 0400) Last BM Date: 07/10/20  Intake/Output from previous day: 11/07 0701 - 11/08 0700 In: 705.7 [I.V.:705.7] Out: 750 [Urine:750] Intake/Output this shift: No intake/output data recorded.  General appearance: no distress CV: Normal S1, S2, No M/R/G Pulm: Normal WOB GI: Soft, incision healing well.  Minimal cloudy drainage in JP bulb, no bile.  Lateral drain with minimal purulent drainage.  Lab Results:  Recent Labs    07/09/20 0734 07/10/20 0852  WBC 16.9* 15.7*  HGB 8.8* 8.8*  HCT 26.7* 27.3*  PLT 633* 656*   BMET Recent Labs    07/09/20 0734 07/10/20 0852  NA 136 136  K 4.3 4.4  CL 106 106  CO2 20* 21*  GLUCOSE 127* 120*  BUN 39* 38*  CREATININE 1.21 1.18   CALCIUM 8.4* 8.6*   PT/INR No results for input(s): LABPROT, INR in the last 72 hours.  Studies/Results: No results found.  Anti-infectives: Anti-infectives (From admission, onward)    Start     Dose/Rate Route Frequency Ordered Stop   07/06/20 1015  anidulafungin (ERAXIS) 100 mg in sodium chloride 0.9 % 100 mL IVPB       "Followed by" Linked Group Details   100 mg 78 mL/hr over 100 Minutes Intravenous Every 24 hours 07/05/20 0922     07/05/20 1015  anidulafungin (ERAXIS) 200 mg in sodium chloride 0.9 % 200 mL IVPB       "Followed by" Linked Group Details   200 mg 78 mL/hr over 200 Minutes Intravenous  Once 07/05/20 0922 07/05/20 1659   07/04/20 1700  piperacillin-tazobactam (ZOSYN) IVPB 3.375 g        3.375 g 12.5 mL/hr over 240 Minutes Intravenous Every 8 hours 07/04/20 1055     07/01/20 1700  piperacillin-tazobactam (ZOSYN) IVPB 3.375 g  Status:  Discontinued        3.375 g 12.5 mL/hr over 240 Minutes Intravenous Every 8 hours 07/01/20 1248 07/04/20 1055   07/01/20 1400  tetracycline (SUMYCIN) capsule 500 mg  Status:  Discontinued       Note to Pharmacy: Empiric h pylori treatment   500 mg Oral 4 times daily 07/01/20 1203 07/01/20 1236   07/01/20 1400  metroNIDAZOLE (FLAGYL) tablet 250 mg  Status:  Discontinued       Note to Pharmacy: Empiric  h pylori treatment   250 mg Oral 4 times daily 07/01/20 1203 07/01/20 1236   06/29/20 1000  piperacillin-tazobactam (ZOSYN) IVPB 3.375 g  Status:  Discontinued        3.375 g 12.5 mL/hr over 240 Minutes Intravenous Every 8 hours 06/29/20 0907 07/01/20 1205   06/28/20 2200  piperacillin-tazobactam (ZOSYN) IVPB 3.375 g  Status:  Discontinued        3.375 g 12.5 mL/hr over 240 Minutes Intravenous Every 12 hours 06/28/20 1407 06/29/20 0907   06/28/20 0600  piperacillin-tazobactam (ZOSYN) IVPB 3.375 g  Status:  Discontinued        3.375 g 12.5 mL/hr over 240 Minutes Intravenous Every 8 hours 06/28/20 0412 06/28/20 1407   06/28/20 0130   cefoTEtan (CEFOTAN) 2 g in sodium chloride 0.9 % 100 mL IVPB        2 g 200 mL/hr over 30 Minutes Intravenous On call to O.R. 06/27/20 2323 06/28/20 0200   06/27/20 2215  piperacillin-tazobactam (ZOSYN) IVPB 3.375 g        3.375 g 100 mL/hr over 30 Minutes Intravenous  Once 06/27/20 2203 06/27/20 2305       Assessment/Plan: s/p Procedure(s): EXPLORATORY LAPAROTOMY, graham patch   LOS: 13 days   MR. Mogel is an 79 yo with a gastric perforation 13 days post-op s/p omental patch that continues to have a leak. Well-appearing with labs remarkable for improving leukocytosis. Plan as follows:  PRN for pain IS, OOB PT working with him and recommending SNF NPO, TPN  PICC in place, needed for TPN CT in about 2 days (Wednesday)  Given WBC normalizing, GPC in the new IR drain fluid, continue antibiotics for intraabdominal infection and antifungal, will change if needed based on the sensitivities  Hep sq, SCDs.   Continue to discuss dispo w/ pt and Rhonda.Potentially can get to SNF or HH in next few days pending continued leukocytosis improvement and arranging placement.   Raliegh Ip 07/11/2020

## 2020-07-11 NOTE — Progress Notes (Signed)
PT Cancellation Note  Patient Details Name: Frederick [name] MRN: [id number] DOB: 16-Oct-1940   Cancelled Treatment:    Reason Eval/Treat Not Completed: Pain limiting ability to participate.   Patient limited to partial long sitting before requesting to stop due BLE pain -  RN notified.   2:15 PM, 07/11/20 Lonell Grandchild, MPT Physical Therapist with [facility] 336 [phone] office [phone] mobile phone

## 2020-07-11 NOTE — Progress Notes (Signed)
PROGRESS NOTE   Frederick [name]  XTA:[id number] DOB: 10-06-1940 DOA: 06/27/2020 PCP: Patient, No Pcp Per   Chief Complaint  Patient presents with  . Abdominal Pain   Brief Admission History:  79 y.o. male with no documented chronic medical problems presenting with 2-week history of abdominal pain that was intermittent in nature, occasionally worse with food.  The patient has not seen a physician for nearly 5 years.  He does not take any prescription medications.  Apparently, the patient ate some chocolate on 06/27/2020 after which he had unrelenting epigastric abdominal pain.  There was some nausea without any emesis.  The patient denies any NSAIDs.  Because of his abdominal pain, the patient presented for further evaluation.  He had denied any chest pain, shortness breath, diarrhea, dysuria.  In the emergency department, the patient was afebrile with soft blood pressures.  CT of the abdomen and pelvis showed moderate free air in the upper abdomen with a small amount of free fluid consistent with perforated viscus.  It was suspected that this was likely in the prepyloric region of the stomach.  General surgery was consulted.  The patient was taken to the operating room and underwent an exploratory laparotomy with vascular omental patch placement.  Unfortunately, there was difficulty extubating the patient postoperatively.  On the morning of 06/28/2020, the patient began developing hypotension.  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    Assessment & Plan:   Principal Problem:   Gastric perforation, acute Active Problems:   Gastric perforation (HCC)   Acute respiratory failure with hypoxia (HCC)   Hyperkalemia   AKI (acute kidney injury) ([place])   Complete heart block (HCC)   Hypotension   Endotracheally intubated   Perforated abdominal viscus   1)Gastric Ulcer Perforation-- s/p Exploratory Laparotomy, vascularized omental patch  by Dr. Constance Haw on 06/28/2020 .C/n   protonix  -Repeat CT abdomen and pelvis from 07/06/2020 reviewed with radiology. Small leak remains patient continues to have bilious output from his JP drain, getting smaller and some gas in area and LUQ collection. Leukocytosis going up  -07/07/20-Pt underwent successful CT-guided left upper quadrant subdiaphragmatic abscess drain placement at [facility]. --Drain culture from 07/07/2020 with GPC-ID and sensitivity pending -- --continue IV Zosyn any further ID and sensitivity 07/11/20 -Very scant amount of purulent drainage from the left flank IR drain tube- -No further  bilious drainage from anterior abdominal JP drain -Discussed with Gen Surgeon --plan to Repeat CT abd on 07/13/20  2)Septic Shock.  Hypotension - resolved now.  He was briefly on dobutamine infusion which is now discontinued. -Hemodynamically more stable -WBC is down to 15.7 from a peak of 20.3  3)Candida Glabrata growing in peritoneal fluid - c/n anidulafungin IV started 07/05/20.   4)Hypertension - IV hydralazine ordered.   5)Hypokalemia / Hypophos - IV replacement, recheck   6)AV block - Appreciate cardiology consultation. Follow for now.  Avoiding beta blockers.  Arrange home cardiac monitor at discharge.    7)Stage 4 CKD - creatinine improved with IV fluids and supportive measures.   -Creatinine normalized with hydration  8)Acute delirium -a lot more oriented, haldol IV prn.  Delirium precautions.   9)FEN--patient remains n.p.o. until biliary leak and associated intra-abdominal abscess improves/resolves, -- continue TPN (started 06/30/20), IV zosyn and IV antifungal  10)Social/Ethics--Rhonda is patient's primary decision maker,, patient remains a full code with full scope of treatment  11)Generalized weakness and deconditioning--- patient is having difficulty participating in rehab -Plan  is for possible transfer to select LTAC later this week   DVT prophylaxis: sQ heparin  Code Status:  Full  Family  Communication:   HCPOA/Ronda updated at bedside 11/4 Disposition: TBD   Status is: Inpatient  Remains inpatient appropriate because:Currently n.p.o. on IV TPN due to biliary leak and intra-abdominal abscess, requiring IV antibiotics and IV antifungal agents pending final culture results.  Pt remains on TPN.   Dispo: The patient is from: Home              Anticipated d/c is to: -Plan is for possible transfer to select LTAC later this week              Anticipated d/c date is: > 3 days              Patient currently is not medically stable to d/c.  Consultants:   Cardiology  pccm  Surgery   IR  Procedures:   Central line placement 10/26  Exp lap with omental patch 10/26 --07/07/20---IRSuccessful CT-guided left upper quadrant subdiaphragmatic abscess drain placement. Culture sent.  Antimicrobials:  Zosyn 10/26>> anidulafungin 11/2>>  Subjective: - -Difficulty with ambulation with physical therapist -HCPOA at bedside, questions answered, no fevers, no vomiting -   Objective: Vitals:   07/10/20 0600 07/10/20 1436 07/11/20 0400 07/11/20 1443  BP: (!) 169/89 (!) 147/78 (!) 165/78 (!) 127/58  Pulse: 80 70 75 72  Resp: 18 19 18 20   Temp: 98.4 F (36.9 C) 98.4 F (36.9 C) 98.7 F (37.1 C) 97.9 F (36.6 C)  TempSrc: Oral  Oral Oral  SpO2:  99% 99% 98%  Weight:      Height:        Intake/Output Summary (Last 24 hours) at 07/11/2020 1845 Last data filed at 07/11/2020 1217 Gross per 24 hour  Intake 1360.54 ml  Output 2125 ml  Net -764.46 ml   Filed Weights   06/29/20 1700 07/02/20 0439 07/03/20 0400  Weight: 70.1 kg 70.5 kg 66.4 kg   Examination:  General exam: elderly chronically ill appearing male, awake, alert, NAD, Respiratory system: Clear to auscultation. Respiratory effort normal. No crackles or wheezes heard.  Cardiovascular system: normal S1 & S2 heard. No JVD,  No pedal edema. Gastrointestinal system: Abdomen is nondistended, soft and   wound clean and  dry and intact.  hypoactive BS.  Anterior abdominal wall JP drain in place without further bilious drainage -Left flank IR intra-abdominal drain in place with very scant purulent drainage Central nervous system: Alert and oriented , generalized weakness . No focal neurological deficits. Extremities: Pedal pulses are good. Skin: No rashes, lesions or ulcers Psychiatry: Affect is appropriate, mostly oriented occasional episodes of frustration  Data Reviewed: I have personally reviewed following labs and imaging studies  CBC: Recent Labs  Lab 07/07/20 0511 07/08/20 0832 07/09/20 0734 07/10/20 0852 07/11/20 0830  WBC 19.7* 20.3* 16.9* 15.7* 15.0*  NEUTROABS 15.8* 15.4* 12.6* 11.1* 12.0*  HGB 9.2* 10.1* 8.8* 8.8* 9.2*  HCT 27.5* 31.0* 26.7* 27.3* 28.2*  MCV 92.0 93.9 92.1 92.9 93.1  PLT 557* 622* 633* 656* 570*   Basic Metabolic Panel: Recent Labs  Lab 07/05/20 0438 07/06/20 0425 07/06/20 0425 07/07/20 0511 07/08/20 0832 07/09/20 0734 07/10/20 0852 07/11/20 0827 07/11/20 0830  NA  --  138   < > 136 137 136 136  --  135  K  --  3.9   < > 3.8 4.2 4.3 4.4  --  4.7  CL  --  107   < > 106 105 106 106  --  105  CO2  --  22   < > 21* 23 20* 21*  --  21*  GLUCOSE  --  121*   < > 134* 112* 127* 120*  --  128*  BUN  --  40*   < > 37* 36* 39* 38*  --  40*  CREATININE  --  1.36*   < > 1.15 1.17 1.21 1.18  --  1.20  CALCIUM  --  8.2*   < > 8.4* 8.6* 8.4* 8.6*  --  8.6*  MG  --  2.2  --  2.3 2.4 2.3 2.3  --   --   PHOS 3.5  --   --  3.5  --   --   --  3.9  --    < > = values in this interval not displayed.   GFR: Estimated Creatinine Clearance: 46.1 mL/min (by C-G formula based on SCr of 1.2 mg/dL).  Liver Function Tests: Recent Labs  Lab 07/07/20 0511 07/08/20 0832 07/09/20 0734 07/10/20 0852 07/11/20 0830  AST 51* 50* 43* 52* 54*  ALT 49* 55* 52* 63* 68*  ALKPHOS 189* 221* 202* 204* 207*  BILITOT 0.6 0.7 0.6 0.6 0.5  PROT 6.1* 6.5 6.3* 6.6 6.7  ALBUMIN 2.0* 2.0* 2.0*  2.0* 2.1*    CBG: Recent Labs  Lab 07/10/20 1603 07/10/20 2003 07/11/20 0802 07/11/20 1159 07/11/20 1635  GLUCAP 112* 107* 125* 132* 128*    Recent Results (from the past 240 hour(s))  Aerobic/Anaerobic Culture (surgical/deep wound)     Status: None (Preliminary result)   Collection Time: 07/07/20  2:34 PM   Specimen: Abscess  Result Value Ref Range Status   Specimen Description   Final    ABSCESS ABDOMEN Performed at [facility], [place] [address] [place], [place] [address]    Special Requests   Final    NONE Performed at [facility], [address] [place], [place] [address]    Gram Stain   Final    ABUNDANT WBC PRESENT,BOTH PMN AND MONONUCLEAR RARE GRAM POSITIVE COCCI Performed at [facility], [place] [address], [place], [place] [address]    Culture   Final    FEW CORYNEBACTERIUM MINUTISSIMUM Standardized susceptibility testing for this organism is not available. NO ANAEROBES ISOLATED; CULTURE IN PROGRESS FOR 5 DAYS    Report Status PENDING  Incomplete     Radiology Studies: No results found. Scheduled Meds: . chlorhexidine gluconate (MEDLINE KIT)  15 mL Mouth Rinse BID  . Chlorhexidine Gluconate Cloth  6 each Topical Daily  . insulin aspart  0-9 Units Subcutaneous Q8H  . mouth rinse  15 mL Mouth Rinse BID  . nystatin  5 mL Mouth/Throat QID  . pantoprazole (PROTONIX) IV  40 mg Intravenous Q12H  . sodium chloride flush  10-40 mL Intracatheter Q12H  . sodium chloride flush  5 mL Intracatheter Q8H   Continuous Infusions: . anidulafungin Stopped (07/11/20 1215)  . piperacillin-tazobactam (ZOSYN)  IV 3.375 g (07/11/20 1337)  . TPN ADULT (ION) 80 mL/hr at 07/11/20 1819    LOS: 13 days   Roxan Hockey, MD.  07/11/2020, 6:45 PM

## 2020-07-11 NOTE — Progress Notes (Addendum)
PHARMACY - TOTAL PARENTERAL NUTRITION CONSULT NOTE    Indication: gastric perforation  Patient Measurements: Height: _0  (172.7 cm) Weight: 66.4 kg (146 lb 6.2 oz) IBW/kg (Calculated) : 68.4 TPN AdjBW (KG): 63.5 Body mass index is 22.26 kg/m. Usual Weight:    Assessment: Per RD note: Inadequate oral intake related to acute illness (gastric ulcer perforation s/p ex-lap with omental patch) as evidenced by NPO status.    Antibiotics: 11/8  Continue  Zosyn  and anidulafungin  until cultures are back on wound 11/4 Wound Cx: rare GPC-->incubated for better growth    Glucose / Insulin: 125-->  SSI q8h sensitive scale -->1 unit of   insulin in past 24 hrs  Electrolytes:  Na 135   K 4.7  Cl 105   Ca  8.6   Phos 3.9    Mg 2.3  Renal:  Scr 1.36>1.15>1.17>1.18 LFTs:   ALT 68     AST 54    Alk phos 202>204>207 TG: 82 Prealbumin:  6.0 Albumin: 2.1 Intake / Output:   Intake/Output Summary (Last 24 hours) at 07/11/2020 0914 Last data filed at 07/11/2020 0809 Gross per 24 hour  Intake 705.71 ml  Output 1500 ml  Net -794.29 ml    GI Imaging:  11/3  CT abd/pelvis found small leak  -->repeat CT on Wednesday, 11-10 Surgeries / Procedures:   s/p exp laparotomy w/ omental patch for perforated gastric ulcer  Central access: 11/3  single lumen PICC line in left upper arm.  TPN start date:  06-30-20 at 1800  Nutritional Goals (per RD rec  on 06-30-20):  kCal: 2158-7276  Protein: 84-95g  Fluid: >1700 mL Goal TPN rate is 80 mL/hr (provides 92 g of protein and ~1827 kcals per day)  Current Nutrition:  TPN   Plan:  Continue TPN at 80 mL/hr at 1800  Electrolytes in TPN: 749mq/L of Na, 569m/L of K, 49m47mL of Ca, 49mE16m of Mg, and 20 mmol/L of Phos.  Cl:Ac ratio 1:1 Standard MVI and trace elements added  to TPN sliding scale to sensitive q8h and adjust as needed  Monitor TPN labs on Mon/Thurs   TamaDespina Polearm. D. Clinical Pharmacist 07/11/2020 9:14 AM

## 2020-07-12 ENCOUNTER — Inpatient Hospital Stay
Admission: RE | Admit: 2020-07-12 | Discharge: 2020-07-27 | Disposition: A | Discharge status: Skilled Nursing Facility | Payer: POS | Attending: Internal Medicine | Admitting: Internal Medicine

## 2020-07-12 ENCOUNTER — Inpatient Hospital Stay (HOSPITAL_COMMUNITY): Payer: Medicare Other

## 2020-07-12 DIAGNOSIS — M25512 Pain in left shoulder: Secondary | ICD-10-CM

## 2020-07-12 DIAGNOSIS — K3189 Other diseases of stomach and duodenum: Secondary | ICD-10-CM

## 2020-07-12 LAB — CBC
HCT: 25.9 % — ABNORMAL LOW (ref 39.0–52.0)
Hemoglobin: 8.4 g/dL — ABNORMAL LOW (ref 13.0–17.0)
MCH: 29.8 pg (ref 26.0–34.0)
MCHC: 32.4 g/dL (ref 30.0–36.0)
MCV: 91.8 fL (ref 80.0–100.0)
Platelets: 583 10*3/uL — ABNORMAL HIGH (ref 150–400)
RBC: 2.82 MIL/uL — ABNORMAL LOW (ref 4.22–5.81)
RDW: 14.9 % (ref 11.5–15.5)
WBC: 11.7 10*3/uL — ABNORMAL HIGH (ref 4.0–10.5)
nRBC: 0 % (ref 0.0–0.2)

## 2020-07-12 LAB — GLUCOSE, CAPILLARY
Glucose-Capillary: 122 mg/dL — ABNORMAL HIGH (ref 70–99)
Glucose-Capillary: 120 mg/dL — ABNORMAL HIGH (ref 70–99)
Glucose-Capillary: 130 mg/dL — ABNORMAL HIGH (ref 70–99)
Glucose-Capillary: 108 mg/dL — ABNORMAL HIGH (ref 70–99)

## 2020-07-12 LAB — COMPREHENSIVE METABOLIC PANEL
ALT: 68 U/L — ABNORMAL HIGH (ref 0–44)
AST: 48 U/L — ABNORMAL HIGH (ref 15–41)
Albumin: 2 g/dL — ABNORMAL LOW (ref 3.5–5.0)
Alkaline Phosphatase: 208 U/L — ABNORMAL HIGH (ref 38–126)
Anion gap: 8 (ref 5–15)
BUN: 40 mg/dL — ABNORMAL HIGH (ref 8–23)
CO2: 22 mmol/L (ref 22–32)
Calcium: 8.5 mg/dL — ABNORMAL LOW (ref 8.9–10.3)
Chloride: 104 mmol/L (ref 98–111)
Creatinine, Ser: 1.27 mg/dL — ABNORMAL HIGH (ref 0.61–1.24)
GFR, Estimated: 57 mL/min — ABNORMAL LOW (ref >60)
Glucose, Bld: 125 mg/dL — ABNORMAL HIGH (ref 70–99)
Potassium: 4.6 mmol/L (ref 3.5–5.1)
Sodium: 134 mmol/L — ABNORMAL LOW (ref 135–145)
Total Bilirubin: 0.5 mg/dL (ref 0.3–1.2)
Total Protein: 6.8 g/dL (ref 6.5–8.1)

## 2020-07-12 LAB — AEROBIC/ANAEROBIC CULTURE W GRAM STAIN (SURGICAL/DEEP WOUND)

## 2020-07-12 MED ORDER — IOHEXOL 9 MG/ML PO SOLN
ORAL | Status: AC
  Filled 2020-07-12: qty 500

## 2020-07-12 MED ORDER — TRAVASOL 10 % IV SOLN
INTRAVENOUS | Status: DC
  Filled 2020-07-12: qty 921.6

## 2020-07-12 MED ORDER — IOHEXOL 300 MG/ML  SOLN
75.0000 mL | Freq: Once | INTRAMUSCULAR | Status: AC | PRN
  Administered 2020-07-12: 75 mL via INTRAVENOUS

## 2020-07-12 MED ORDER — ONDANSETRON 4 MG PO TBDP: 4.0000 mg | ORAL_TABLET | Freq: Four times a day (QID) | ORAL | 0 refills | Status: AC | PRN

## 2020-07-12 MED ORDER — PANTOPRAZOLE SODIUM 40 MG IV SOLR
40.0000 mg | Freq: Two times a day (BID) | INTRAVENOUS | 1 refills | Status: DC
Start: 2020-07-12 — End: 2020-08-18

## 2020-07-12 NOTE — Discharge Instructions (Signed)
NB!!  -1)[facility]--- patient will need physical and occupational therapy, patient will also need TPN nutrition, consider general surgery consult 2)--patient's healthcare power of attorney and decision maker is Ms Robyn Haber ---[phone]  3) please avoid NSAIDs 4) please continue TPN via PICC until oral intake is more reliable and continue PPI 5) patient is a Full Code 6) okay for patient to have full liquid diet 7)Midline JP drain on left flank IR drain to stay in until patient is seen by Dr. Constance Haw as an outpatient on Tuesday, 07/19/2020 otherwise the drains can be removed by interventional radiology or general surgeon locally at [facility] when appropriate -- Drain care, record volume and character of drainage daily and bring to clinic for removal versus removing at [facility] -Please document output from drains every shift 8)Ok to Give Iv Rocephin 1 gm q 24 for additional 3 days , antibiotic therapy duration may be extended at the discretion of the treating physician at [facility] 9)-patient's healthcare power of attorney and decision maker is Ms Robyn Haber ---[phone]  10) please avoid NSAIDs

## 2020-07-12 NOTE — Progress Notes (Addendum)
I was present with the medical student for this service. I personally verified the history of present illness, performed the physical exam, and made the plan for this encounter. I have verified the medical student's documentation and made modifications where appropriately. I have personally documented in my own words a brief history, physical, and plan below.     Doing well. CT repeated.  No leak noted. Calcified area noted but not contrast leaking. Fluid collections decreasing in size but remain.   Full liquid diet Would keep TPN For now until taking in more Po IV protonix  Zosyn day 14 and antifungal day 8, can likely dc in the next few days at [facility] as WBC is down and collections resolving PICC In placed JP RUQ and IR drain LUQ in place record output  Curlene Labrum, MD [facility] [place], [place] [address] [phone] (office)     [facility] Progress Note  14 Days Post-Op  Subjective: No ON events. Stopped PT mid session yesterday 2/2 to lower extremity pain but feeling well today following PT and staple removal. No pain currently.  Objective: Vital signs in last 24 hours: Temp:  [97.3 F (36.3 C)-98.1 F (36.7 C)] 97.3 F (36.3 C) (11/09 0646) Pulse Rate:  [71-72] 71 (11/09 0646) Resp:  [19-20] 19 (11/09 0646) BP: (127-166)/(58-79) 166/79 (11/09 0646) SpO2:  [90 %-99 %] 99 % (11/09 0646) Last BM Date: 07/10/20  General appearance:no distress CV: Normal S1, S2, No M/R/G Pulm: Normal WOB Neuro: Alert to person, not fully alert to place or time (knows year and that we are in [place] but not the month or the country) MW:UXLK, incision healing well. Minimal cloudy drainage in JP bulb, no bile. Lateral drain with minimal purulent drainage.  Lab Results:  Recent Labs    07/10/20 0852 07/11/20 0830  WBC 15.7* 15.0*  HGB 8.8* 9.2*  HCT 27.3* 28.2*  PLT 656* 645*   BMET Recent Labs    07/10/20 0852  07/11/20 0830  NA 136 135  K 4.4 4.7  CL 106 105  CO2 21* 21*  GLUCOSE 120* 128*  BUN 38* 40*  CREATININE 1.18 1.20  CALCIUM 8.6* 8.6*   PT/INR No results for input(s): LABPROT, INR in the last 72 hours.  Studies/Results: No results found.  Anti-infectives: Anti-infectives (From admission, onward)   Start     Dose/Rate Route Frequency Ordered Stop   07/06/20 1015  anidulafungin (ERAXIS) 100 mg in sodium chloride 0.9 % 100 mL IVPB       "Followed by" Linked Group Details   100 mg 78 mL/hr over 100 Minutes Intravenous Every 24 hours 07/05/20 0922     07/05/20 1015  anidulafungin (ERAXIS) 200 mg in sodium chloride 0.9 % 200 mL IVPB       "Followed by" Linked Group Details   200 mg 78 mL/hr over 200 Minutes Intravenous  Once 07/05/20 0922 07/05/20 1659   07/04/20 1700  piperacillin-tazobactam (ZOSYN) IVPB 3.375 g        3.375 g 12.5 mL/hr over 240 Minutes Intravenous Every 8 hours 07/04/20 1055     07/01/20 1700  piperacillin-tazobactam (ZOSYN) IVPB 3.375 g  Status:  Discontinued        3.375 g 12.5 mL/hr over 240 Minutes Intravenous Every 8 hours 07/01/20 1248 07/04/20 1055   07/01/20 1400  tetracycline (SUMYCIN) capsule 500 mg  Status:  Discontinued       Note to Pharmacy: Empiric h pylori treatment  500 mg Oral 4 times daily 07/01/20 1203 07/01/20 1236   07/01/20 1400  metroNIDAZOLE (FLAGYL) tablet 250 mg  Status:  Discontinued       Note to Pharmacy: Empiric h pylori treatment   250 mg Oral 4 times daily 07/01/20 1203 07/01/20 1236   06/29/20 1000  piperacillin-tazobactam (ZOSYN) IVPB 3.375 g  Status:  Discontinued        3.375 g 12.5 mL/hr over 240 Minutes Intravenous Every 8 hours 06/29/20 0907 07/01/20 1205   06/28/20 2200  piperacillin-tazobactam (ZOSYN) IVPB 3.375 g  Status:  Discontinued        3.375 g 12.5 mL/hr over 240 Minutes Intravenous Every 12 hours 06/28/20 1407 06/29/20 0907   06/28/20 0600  piperacillin-tazobactam (ZOSYN) IVPB 3.375 g  Status:   Discontinued        3.375 g 12.5 mL/hr over 240 Minutes Intravenous Every 8 hours 06/28/20 0412 06/28/20 1407   06/28/20 0130  cefoTEtan (CEFOTAN) 2 g in sodium chloride 0.9 % 100 mL IVPB        2 g 200 mL/hr over 30 Minutes Intravenous On call to O.R. 06/27/20 2323 06/28/20 0200   06/27/20 2215  piperacillin-tazobactam (ZOSYN) IVPB 3.375 g        3.375 g 100 mL/hr over 30 Minutes Intravenous  Once 06/27/20 2203 06/27/20 2305      Assessment/Plan: s/p Procedure(s): EXPLORATORY LAPAROTOMY, graham patch  LOS: 14 days    MR. Chui is an 79 yo with a gastric perforation 13 days post-op s/p omental patch that continues to have a leak. Well-appearing with labs remarkable for improving leukocytosis and JP and lateral drains w/ minimal drainage. Plan as follows:  PRN for pain IS, OOB PT working with him and recommending SNF NPO, TPN  PICC in place, needed for TPN CT today since SNF bed may be available; tomorrow if not done today  Given WBC normalizing, GPC in the new IR drain fluid, continue antibiotics for intraabdominal infection and antifungal, will change if needed based on the sensitivities. Make sure sensitivities are back, before d/c abx.   Hep sq, SCDs.  Pt and Rhonda amenable to SNF. Potentially can get to SNF in next few days pending continued leukocytosis improvement and arranging placement.     Raliegh Ip 07/12/2020

## 2020-07-12 NOTE — Progress Notes (Signed)
PHARMACY - TOTAL PARENTERAL NUTRITION CONSULT NOTE    Indication: gastric perforation  Patient Measurements: Height: 5' 8"  (172.7 cm) Weight: 66.4 kg (146 lb 6.2 oz) IBW/kg (Calculated) : 68.4 TPN AdjBW (KG): 63.5 Body mass index is 22.26 kg/m.   Assessment: Per RD note: Inadequate oral intake related to acute illness (gastric ulcer perforation s/p ex-lap with omental patch) as evidenced by NPO status.    Antibiotics: 11/8  Continue  Zosyn  and anidulafungin  until cultures are back on wound 11/4 Wound Cx: corynebacterium     Glucose / Insulin: 125-->  SSI q8h sensitive scale -->2 unit of   insulin in past 24 hrs  Electrolytes:     K 4.7    Ca  8.6   Phos 3.9    Mg 2.3  Renal:  Scr 1.2 LFTs:   ALT 68     AST 54    Alk phos 207 TG: 82 Prealbumin:  6.0 Albumin: 2.1 Intake / Output:   Intake/Output Summary (Last 24 hours) at 07/12/2020 0825 Last data filed at 07/12/2020 0600 Gross per 24 hour  Intake 1595.66 ml  Output 1943 ml  Net -347.34 ml    GI Imaging:  11/3  CT abd/pelvis found small leak  -->repeat CT on Wednesday, 11-10 Surgeries / Procedures:   s/p exp laparotomy w/ omental patch for perforated gastric ulcer  Central access: 11/3  single lumen PICC line in left upper arm.  TPN start date:  06-30-20 at 1800  Nutritional Goals (per RD rec  on 06-30-20):  kCal: 1505-6979  Protein: 84-95g  Fluid: >1700 mL Goal TPN rate is 80 mL/hr (provides 92 g of protein and ~1827 kcals per day)  Current Nutrition:  TPN   Plan:  Continue TPN at 80 mL/hr at 1800  Electrolytes in TPN: 60 mEq/L of Na, 40 mEq/L of K, 8mq/L of Ca, 564m/L of Mg, and 20 mmol/L of Phos.  Cl:Ac ratio 1:2 Standard MVI and trace elements added  to TPN sliding scale to sensitive q8h and adjust as needed  Monitor TPN labs on Mon/Thurs   StMargot AblesPharmD Clinical Pharmacist 07/12/2020 8:36 AM

## 2020-07-12 NOTE — Care Management Important Message (Signed)
Important Message  Patient Details  Name: Frederick [name] MRN: [id number] Date of Birth: [birth year]   Medicare Important Message Given:  Yes     Tommy Medal 07/12/2020, 3:12 PM

## 2020-07-12 NOTE — TOC Transition Note (Signed)
Transition of Care [facility]) - CM/SW Discharge Note   Patient Details  Name: Frederick [name] MRN: [id number] Date of Birth: September 05, 1940  Transition of Care [facility] CM/SW Contact:  Shade Flood, LCSW Phone Number: 07/12/2020, 3:50 PM   Clinical Narrative:     Pt is approved for admission to [facility] and they have a bed today. MD has spoken with pt and his POA, Suanne Marker, and they are in agreement. Erika from [facility] has also spoken with Suanne Marker for admission consents.   DC clinical available to [facility] electronically. Pt will go to room 5E23 and number for report is [phone]. RN updated.  EMS form printed to the floor. EMS arranged for 6:00pm at request of [facility].  There are no other TOC needs for dc.  Final next level of care: Long Term Acute Care (LTAC) Barriers to Discharge: Barriers Resolved   Patient Goals and CMS Choice Patient states their goals for this hospitalization and ongoing recovery are:: to go home. CMS Medicare.gov Compare Post Acute Care list provided to:: Patient Choice offered to / list presented to : Adult Children  Discharge Placement                       Discharge Plan and Services In-house Referral: Clinical Social Work   Post Acute Care Choice: [place]                               Social Determinants of Health (SDOH) Interventions     Readmission Risk Interventions Readmission Risk Prevention Plan 07/08/2020 07/05/2020  Transportation Screening Complete Complete  Home Care Screening - Complete  Medication Review (RN CM) - Complete

## 2020-07-12 NOTE — Progress Notes (Signed)
Physical Therapy Treatment Patient Details Name: Frederick [name] MRN: [id number] DOB: 09/18/1940 Today's Date: 07/12/2020    History of Present Illness Frederick [name] is a 79 y.o. male s/p Exploratory laparotomy, vascularized omental patch on 06/28/20 who does not seek medical attention and has no PCP and takes no medications. He came in with acute onset of abdominal pain in his epigastric area after eating chocolate earlier today. The pain comes in waves and is not associated with any vomiting.    PT Comments    Pt alert and agreeable to therapy. Pt requires increased time, constant cues for sequencing and hand placement, and log rolling technique throughout bed mobility training this session. Pt requires mod A to upright trunk into sitting with HOB elevated and cues to exhale to protect abdomen. Pt tolerates seated therapeutic exercise with frequent posterior trunk lean or L trunk lean requiring assist to return to midline. Pt able to complete single STS this session and take a few sidesteps to [facility], but declines further ambulation due to pain. Pt assisted back to supine with all needs in reach and MD entering room at EOS. Pt unable to follow multi-step commands, and requiring increased time with single step commands. Pt unable to verbalize pain number on pain scale during session.  Pt will benefit from continued physical therapy in hospital and recommendations below to increase strength, balance, endurance for safe ADLs and gait.    Follow Up Recommendations  SNF;Supervision for mobility/OOB;Supervision/Assistance - 24 hour     Equipment Recommendations  Rolling walker with 5" wheels    Recommendations for Other Services       Precautions / Restrictions Precautions Precautions: Fall Precaution Comments: JP drain Restrictions Weight Bearing Restrictions: No    Mobility  Bed Mobility Overal bed mobility: Needs Assistance Bed Mobility: Supine to Sit;Sit to Supine  Supine to sit: Mod  assist;HOB elevated Sit to supine: Mod assist;HOB elevated   General bed mobility comments: constant cues for hand placement and sequencing, cues for log rolling technique and assist with bed pad to position pt into sidelying, pt able to clear BLE to EOB with cues, assist to upright trunk; mod A to return to supine with assist for lifting BLE and constant sequencing cues for log rolling to protect abdomen  Transfers   Equipment used: Rolling walker (2 wheeled) Transfers: Sit to/from Stand Sit to Stand: Mod assist    General transfer comment: slow, labored movement, mod A to power up with cues for hand placement, maintains flexed trunk and bil knees in standing with minimal to no improvement with cues  Ambulation/Gait Ambulation/Gait assistance: Max assist   Assistive device: Rolling walker (2 wheeled)  General Gait Details: pt limited to sidesteps to [facility] with increased pain and requests to return to sitting, maintains flexed trunk throughout despite cues for posture, improvement in pain once seated   Stairs             Wheelchair Mobility    Modified Rankin (Stroke Patients Only)       Balance Overall balance assessment: Needs assistance Sitting-balance support: Feet supported;No upper extremity supported Sitting balance-Leahy Scale: Poor Sitting balance - Comments: seated EOB, frequent posterior lean and L lateral lean requiring cues and assist to return to midline   Standing balance support: During functional activity;Bilateral upper extremity supported Standing balance-Leahy Scale: Poor Standing balance comment: reliant on UE and assist from therapist       Cognition Arousal/Alertness: Awake/alert Behavior During Therapy: [facility] for tasks assessed/performed Overall Cognitive  Status: No family/caregiver present to determine baseline cognitive functioning    General Comments: Pt singing [employer] songs while performing exercises with therapist. Pt follows single step  commands with increased time, unable to follow multi-step commands. Pt states year is 3, then able to correct to 2021 when stating Barbette Or is president. Pt unable to state location or situation, but does report he got staples removed from abdomen and it was painful.      Exercises General Exercises - Lower Extremity Long Arc Quad: Seated;AROM;Strengthening;Both;5 reps Hip Flexion/Marching: Seated;AROM;Strengthening;Both;10 reps Toe Raises: Seated;AROM;Strengthening;Both;15 reps Heel Raises: Seated;AROM;Strengthening;Both;15 reps    General Comments        Pertinent Vitals/Pain Pain Assessment: Faces Faces Pain Scale: Hurts little more Pain Location: abdomen with mobility Pain Descriptors / Indicators: Grimacing;Guarding;Moaning Pain Intervention(s): Limited activity within patient's tolerance;Monitored during session;Repositioned    Home Living                      Prior Function            PT Goals (current goals can now be found in the care plan section) Acute Rehab PT Goals Patient Stated Goal: return home with family/friends to assist PT Goal Formulation: With patient Time For Goal Achievement: 07/18/20 Potential to Achieve Goals: Good Progress towards PT goals: Progressing toward goals    Frequency    Min 3X/week      PT Plan Current plan remains appropriate    Co-evaluation              AM-PAC PT "6 Clicks" Mobility   Outcome Measure  Help needed turning from your back to your side while in a flat bed without using bedrails?: A Lot Help needed moving from lying on your back to sitting on the side of a flat bed without using bedrails?: A Lot Help needed moving to and from a bed to a chair (including a wheelchair)?: A Lot Help needed standing up from a chair using your arms (e.g., wheelchair or bedside chair)?: A Lot Help needed to walk in hospital room?: A Lot Help needed climbing 3-5 steps with a railing? : Total 6 Click Score: 11     End of Session   Activity Tolerance: Patient limited by pain Patient left: in bed;with call bell/phone within reach;with bed alarm set;with nursing/sitter in room Nurse Communication: Mobility status PT Visit Diagnosis: Unsteadiness on feet (R26.81);Other abnormalities of gait and mobility (R26.89);Muscle weakness (generalized) (M62.81)     Time: 8527-7824 PT Time Calculation (min) (ACUTE ONLY): 23 min  Charges:  $Therapeutic Exercise: 8-22 mins $Therapeutic Activity: 8-22 mins                      Tori Shelton Square PT, DPT 07/12/20, 10:15 AM [phone]

## 2020-07-12 NOTE — Discharge Summary (Addendum)
Frederick [name], is a 79 y.o. male  DOB 24-Dec-1940  MRN [id number].  Admission date:  06/27/2020  Admitting Physician  Virl Cagey, MD  Discharge Date:  07/12/2020   Primary MD  Patient, No Pcp Per  Recommendations for primary care physician for things to follow:   NB!!  -1)[facility]--- patient will need physical and occupational therapy, patient will also need TPN nutrition, consider general surgery consult 2)--patient's healthcare power of attorney and decision maker is Ms Robyn Haber ---[phone]  3) please avoid NSAIDs 4) please continue TPN via PICC until oral intake is more reliable and continue PPI 5) patient is a Full Code 6) okay for patient to have full liquid diet 7)Midline JP drain on left flank IR drain to stay in until patient is seen by Dr. Constance Haw as an outpatient on Tuesday, 07/19/2020 otherwise the drains can be removed by interventional radiology or general surgeon locally at [facility] when appropriate -- Drain care, record volume and character of drainage daily and bring to clinic for removal versus removing at [facility] -Please document output from drains every shift 8)Ok to Give Iv Rocephin 1 gm q 24 for additional 3 days , antibiotic therapy duration may be extended at the discretion of the treating physician at [facility] 9)-patient's healthcare power of attorney and decision maker is Ms Robyn Haber ---[phone]  10) please avoid NSAIDs   Admission Diagnosis  AKI (acute kidney injury) ([place]) [N17.9] Perforated abdominal viscus [R19.8] Gastric perforation ([place]) [K25.5]   Discharge Diagnosis  AKI (acute kidney injury) ([place]) [N17.9] Perforated abdominal viscus [R19.8] Gastric perforation ([place]) [K25.5]    Principal Problem:   Gastric perforation, acute Active Problems:   AKI (acute kidney injury) ([place])   Perforated abdominal viscus    Gastric perforation ([place])   Acute respiratory failure with hypoxia ([place])   Hyperkalemia   Complete heart block ([place])   Hypotension   Endotracheally intubated      History reviewed. No pertinent past medical history.  Past Surgical History:  Procedure Laterality Date   LAPAROTOMY N/A 06/28/2020   Procedure: EXPLORATORY LAPAROTOMY, graham patch;  Surgeon: Virl Cagey, MD;  Location: [facility];  Service: General;  Laterality: N/A;     HPI  from the history and physical done on the day of admission:    HPI on Admission as documented by Admitting Physician:-  Frederick [name] is a 79 y.o. male.  HPI: Frederick [name] is an 79 yo who does not seek medical attention and has no PCP and takes no medications. He came in with acute onset of abdominal pain in his epigastric area after eating chocolate earlier today. The pain comes in waves and is not associated with any vomiting.   No prior colonoscopy and no reported NSAID/ BC powder use.   History reviewed. No pertinent past medical history.  History reviewed. No pertinent surgical history.  History reviewed. No pertinent family history.     Hospital Course:     Brief Admission History:  79 y.o.malewithno documented chronic medical problems presenting with 2-week history of abdominal pain that was intermittent in nature, occasionally worse with food. The patient has not seen a physician for nearly 5 years. He does not take any prescription medications. Apparently, the patient ate some chocolate on 06/27/2020 after which he had unrelenting epigastric abdominal pain. There was some nausea without any emesis. The patient denies any NSAIDs. Because of his abdominal pain, the patient presented for further evaluation. He had denied any chest pain, shortness breath, diarrhea, dysuria. In the emergency department, the patient was afebrile with soft blood pressures. CT of the abdomen and pelvis showed moderate free air in the upper  abdomen with a small amount of free fluid consistent with perforated viscus. It was suspected that this was likely in the prepyloric region of the stomach. General surgery was consulted. The patient was taken to the operating room and underwent an exploratory laparotomy with vascular omental patch placement. Unfortunately, therewas difficulty extubating the patient postoperatively. On the morning of 06/28/2020, the patient began developing hypotension. 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   Assessment & Plan:   Principal Problem:   Gastric perforation, acute Active Problems:   Gastric perforation ([facility])   Acute respiratory failure with hypoxia ([facility])   Hyperkalemia   AKI (acute kidney injury) ([place])   Complete heart block ([facility])   Hypotension   Endotracheally intubated   Perforated abdominal viscus   1)Gastric Ulcer Perforation-- s/p Exploratory Laparotomy, vascularized omental patch by Dr. Constance Haw on 06/28/2020 .C/n  Protonix  -Repeat CT abdomen and pelvis from 07/06/2020 reviewed with radiology. Small leak remains patient continues to have bilious output from his JP drain, getting smaller and some gas in area and LUQ collection. Leukocytosis going up  -07/07/20-Pt underwent successful CT-guided left upper quadrant subdiaphragmatic abscess drain placement at [place]. --Drain culture from 07/07/2020 with GPC-ID and sensitivity pending -- --continue IV Zosyn any further ID and sensitivity 07/12/20 --- Mostly resolved purulent drainage from the left flank IR drain tube- -No further  bilious drainage from anterior abdominal JP drain -Discussed with Gen Surgeon --  Repeat CT abd on 07/12/20--pending -Please contact interventional radiology with regards to the left flank drainage tube timeline for removal -Zosyn 10/26>>   Last dose 07/12/20 anidulafungin 11/2>> last dose 07/12/20  2)Septic Shock--- Hypotension - resolved now.  He was briefly on  dobutamine infusion which is now discontinued. 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 Follow for now.  Avoiding beta blockers.  Arrange home cardiac monitor post discharge   7)Stage 4 CKD - creatinine improved with IV fluids and supportive measures.   -Discharge creatinine is 1.27  8)Acute delirium -resolved, a lot more oriented, haldol IV prn.    9)FEN--patient remains n.p.o. until biliary leak and associated intra-abdominal abscess improves/resolves, -- continue TPN (started 06/30/20),   10)Social/Ethics--Rhonda is patient's primary decision maker,, patient remains a full code with full scope of treatment  11)-anemia--stable, Hgb is currently above 8--  12)Generalized weakness and deconditioning--- patient is having difficulty participating in rehab -[facility]  DVT prophylaxis: sQ heparin  Code Status:  Full  Family Communication:   HCPOA/Ronda updated at bedside 11/4 Disposition: -[facility]   Dispo: The patient is from: Home  Anticipated d/c is to: --[facility]  Consultants:   Cardiology  pccm  Surgery   IR  Procedures:  -Intubated and subsequently extubated  Central line placement 10/26  Exp lap with omental patch 10/26 --07/07/20---IR Successful CT-guided left upper quadrant subdiaphragmatic abscess drain placement. Culture sent.  Antimicrobials:  Zosyn 10/26>>   Last dose 07/12/20 anidulafungin 11/2>> last dose 07/12/20  Discharge Condition: stable  Follow  UP   Contact information for follow-up providers    Erlene Quan, PA-C Follow up on 07/19/2020.   Specialties: Cardiology, Radiology Why: Cardiology Hospital Follow-up on 07/19/2020 at 2:00 PM.  Contact information: [place] [address] [phone]        Virl Cagey, MD Follow up on 07/19/2020.   Specialty: General Surgery Why: drain removal/ wound check  Contact information: [address] [place] [facility] [address] [place] Follow up.   Why:  PT will call within 48 hours of discharge to schedule a visit.        [facility] Follow up.   Why:   TPN Contact information: [place]ynn S[place] [phone]           Contact information for after-discharge care    [facility] .   Service: Skilled Nursing Contact information: [address] [place] [phone]                  Diet and Activity recommendation:  As advised  Discharge Instructions   Discharge Instructions    Call MD for:  persistant dizziness or light-headedness   Complete by: As directed    Call MD for:  persistant nausea and vomiting   Complete by: As directed    Call MD for:  severe uncontrolled pain   Complete by: As directed    Call MD for:  temperature >100.4   Complete by: As directed    Discharge instructions   Complete by: As directed    NB!!  -1)[facility]--- patient will need physical and occupational therapy, patient will also need TPN nutrition, consider general surgery consult 2)--patient's healthcare power of attorney and decision maker is Ms Robyn Haber ---[phone]  3) please avoid NSAIDs 4) please continue TPN via PICC until oral intake is more reliable and continue PPI 5) patient is a Full Code 6) okay for patient to have full liquid diet 7)Midline JP drain on left flank IR drain to stay in until patient is seen  by Dr. Constance Haw as an outpatient on Tuesday, 07/19/2020 otherwise the drains can be removed by interventional radiology or general surgeon locally at [facility] when appropriate -- Drain care, record volume and character of drainage daily and bring to clinic for removal versus removing at [facility] -Please document output from drains every shift 8)Ok to Give Iv Rocephin 1 gm q 24 for additional 3 days , antibiotic therapy duration may be extended at the discretion of the treating physician at [facility] 9)-patient's healthcare power of attorney and decision maker is Ms Robyn Haber ---[phone]  10) please avoid NSAIDs   Discharge wound care:   Complete by: As directed    As advised  Increase activity slowly   Complete by: As directed        Discharge Medications     Allergies as of 07/12/2020   No Known Allergies     Medication List    TAKE these medications   acetaminophen 500 MG tablet Commonly known as: TYLENOL Take 500 mg by mouth every 6 (six) hours as needed.   ondansetron 4 MG disintegrating tablet Commonly known as: ZOFRAN-ODT Take 1 tablet (4 mg total) by mouth every 6 (six) hours as needed for nausea.   pantoprazole 40 MG injection Commonly known as: PROTONIX Inject 40 mg into the vein every 12 (twelve) hours.            Discharge Care Instructions  (From admission, onward)         Start     Ordered   07/12/20 0000  Discharge wound care:       Comments: As advised   07/12/20 1457          Major procedures and Radiology Reports - PLEASE review detailed and final reports for all details, in brief -   CT ABDOMEN PELVIS WO CONTRAST  Result Date: 06/27/2020 CLINICAL DATA:  Abdominal pain EXAM: CT ABDOMEN AND PELVIS WITHOUT CONTRAST TECHNIQUE: Multidetector CT imaging of the abdomen and pelvis was performed following the standard protocol without IV contrast. COMPARISON:  None. FINDINGS: Lower chest: Lung bases demonstrate mild emphysematous  disease. No acute consolidation or effusion. Minimal subpleural fibrosis. Coronary vascular calcification. Hepatobiliary: No focal liver abnormality is seen. No gallstones, gallbladder wall thickening, or biliary dilatation. Pancreas: Unremarkable. No pancreatic ductal dilatation or surrounding inflammatory changes. Spleen: Normal in size without focal abnormality. Adrenals/Urinary Tract: Adrenal glands are normal. Kidneys show no hydronephrosis. Multiple low-attenuation lesions within both kidneys favored to represent cysts though incompletely characterized without contrast. Slightly thick-walled urinary bladder. Stomach/Bowel: Wall thickening at the pre pyloric region of the stomach with suspected focal defect/perforation in the wall, coronal series 5, image number 23. No small bowel dilatation. Moderate stool in the colon. Negative appendix. 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 Vascular/Lymphatic: Moderate aortic atherosclerosis without aneurysm. Ectatic common iliac vessels measuring 1.6 cm on the right and 1.8 cm on the left. No suspicious adenopathy. Reproductive: Slightly enlarged prostate with mass effect on the bladder Other: Moderate free air within the upper abdomen. Small amount of ascites within the abdomen and pelvis. Musculoskeletal: No acute or significant osseous findings. IMPRESSION: 1. Moderate free air in the upper abdomen with small amount of free fluid in the abdomen and pelvis consistent with hollow viscus perforation. Suspected source is the pre-pyloric region of the stomach where there is wall thickening and apparent defect, either representing perforated ulcer or perforated gastric mass. 2. 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 Slightly enlarged prostate. Bladder slightly thick walled which may be due to cystitis or chronic obstruction. Critical Value/emergent results were called by telephone at the time of interpretation on 06/27/2020  at 9:44 pm to provider JULIE HAVILAND , who verbally acknowledged these results. Aortic Atherosclerosis (ICD10-I70.0). Electronically Signed   By: Donavan Foil M.D.   On: 06/27/2020 21:44   CT ABDOMEN WO CONTRAST  Result Date: 07/01/2020 CLINICAL DATA:  Bile within Jackson-Pratt drain post Phillip Heal patch of perforated gastric ulcer EXAM: CT ABDOMEN WITHOUT CONTRAST TECHNIQUE: Multidetector CT imaging of the abdomen was performed following the standard protocol without IV contrast. Sagittal and coronal MPR images reconstructed from axial data set. No oral contrast was administered for  this study; contrast is present within the stomach and bowel from preceding water-soluble upper GI. COMPARISON:  Upper GI 07/01/2020, CT abdomen and pelvis 06/27/2020 FINDINGS: Lower chest: Bibasilar effusions and atelectasis Hepatobiliary: Distended gallbladder.  Liver unremarkable. Pancreas: Normal appearance Spleen: Normal appearance Adrenals/Urinary Tract: Multiple BILATERAL renal cysts. Unremarkable adrenal glands. Question tiny vascular calcification versus nonobstructing calculus LEFT renal pelvis. No additional urinary tract calcification or dilatation Stomach/Bowel: Dense contrast within stomach, small bowel loops and colon. Small amount of extravasated contrast is seen adjacent to the tip of the Jackson-Pratt drain which extends along the lesser curve of the stomach consistent with subtle perforation. This is in the general region of the surgical repair. This was not visible on the preceding upper GI exam. Remaining contrast is within the bowel. Duodenal diverticulum noted. Vascular/Lymphatic: Atherosclerotic calcifications aorta, coronary arteries, visceral arteries. No adenopathy Other: Fluid identified in the LEFT upper quadrant, unchanged. Few scattered foci of free air and free fluid consistent with surgery and drain. No additional focal fluid collection or extravasated contrast. Musculoskeletal: Bones demineralized.  IMPRESSION: Small amount of extravasated contrast is seen adjacent to the tip of the Jackson-Pratt drain which extends to the lesser curve of the stomach consistent with subtle perforation/leak. Fluid in the LEFT upper quadrant laterally with scattered foci of free air and free fluid consistent with prior perforation, surgery and indwelling drain. Bibasilar effusions and atelectasis. Multiple BILATERAL renal cysts. Question tiny vascular calcification versus nonobstructing calculus LEFT renal pelvis. Aortic Atherosclerosis (ICD10-I70.0). Electronically Signed   By: Lavonia Dana M.D.   On: 07/01/2020 14:33   DG Abd 1 View  Result Date: 07/01/2020 CLINICAL DATA:  Bile coming out of Jackson-Pratt drain EXAM: ABDOMEN - 1 VIEW COMPARISON:  None FINDINGS: Contrast opacifies gastric lumen, duodenum small bowel loops throughout abdomen and pelvis. No definite contrast is identified along the surgical drain. Osseous structures demineralized. IMPRESSION: No definite contrast extravasation identified. Electronically Signed   By: Lavonia Dana M.D.   On: 07/01/2020 13:04   CT ABDOMEN W CONTRAST  Result Date: 07/12/2020 CLINICAL DATA:  Per perforated gastric ulcer post Phillip Heal patch, minimal postoperative leak, follow-up EXAM: CT ABDOMEN WITH CONTRAST TECHNIQUE: Multidetector CT imaging of the abdomen was performed using the standard protocol following bolus administration of intravenous contrast. Sagittal and coronal MPR images reconstructed from axial data set. CONTRAST:  30mL OMNIPAQUE IOHEXOL 300 MG/ML SOLN IV. Patient drank dilute oral contrast for exam. COMPARISON:  07/06/2020 FINDINGS: Lower chest: Decreased RIGHT pleural effusion and basilar atelectasis. Persistent small LEFT pleural effusion and basilar atelectasis Hepatobiliary: Gallbladder and liver normal appearance Pancreas: Normal appearance Spleen: Normal appearance Adrenals/Urinary Tract: Adrenal glands normal appearance. BILATERAL renal cysts. No  hydronephrosis or proximal ureteral dilatation. Stomach/Bowel: Few foci of gas are again seen adjacent to the distal aspect of the JP drain at the lesser curve. Single tiny hyperdensity is seen near the tip of the drain, decreased since previous exam. This is smaller than was seen on the previous exam and now appears rounded, and is more dense than the contrast in the stomach, suspect small calcification. Wall thickening of the stomach along the anterior aspect of the antrum greater and superiorly, corresponding to site of perforation and Graham patch. Remainder of stomach unremarkable. Large and small bowel loops normal appearance. Vascular/Lymphatic: Atherosclerotic calcifications aorta, proximal iliac arteries, minimally in visceral arteries. Other: Interval decrease in size of abscess collection previously identified lateral to the spleen post percutaneous drainage. A persistent collection is seen along the undersurface of the  LEFT hemidiaphragm medially, 2.1 cm maximal thickness x 9.6 cm AP x 3.5 cm length. This collection potentially communicates with the percutaneous drain. Musculoskeletal: No additional fluid collections identified. No free air or ascites. IMPRESSION: Previously identified minor contrast leak is not definitely identified on the current study. A small rounded hyperdensity is seen near the tip of the JP drain, appears more dense than the contrast in the stomach, suspect tiny calcification. Interval near complete resolution of the postoperative collection seen lateral to the spleen though residual collection underneath the medial surface of the LEFT hemidiaphragm is identified though smaller, potentially communicating with the pigtail drain. Findings discussed with Dr. Constance Haw on 07/12/2020 at 1617 hours. Electronically Signed   By: Lavonia Dana M.D.   On: 07/12/2020 16:19   CT ABDOMEN PELVIS W CONTRAST  Result Date: 07/06/2020 CLINICAL DATA:  Perforated gastric ulcer post Phillip Heal patch, leak  on prior exam, follow-up; leukocytosis EXAM: CT ABDOMEN AND PELVIS WITH CONTRAST TECHNIQUE: Multidetector CT imaging of the abdomen and pelvis was performed using the standard protocol following bolus administration of intravenous contrast. Sagittal and coronal MPR images reconstructed from axial data set. CONTRAST:  134mL OMNIPAQUE IOHEXOL 300 MG/ML SOLN IV. Dilute oral contrast COMPARISON:  07/01/2020 FINDINGS: Lower chest: Small bibasilar pleural effusions and minimal compressive atelectasis of the lower lobes Hepatobiliary: Liver normal appearance. Minimal gallbladder wall thickening. No biliary dilatation. Pancreas: Normal appearance Spleen: Normal appearance Adrenals/Urinary Tract: Adrenal glands normal appearance. BILATERAL renal cysts, largest medial aspect inferior LEFT kidney 4.6 x 3.7 cm image 36. No hydronephrosis or ureteral dilatation. Minimal bladder wall thickening which may related outlet obstruction. Stomach/Bowel: Wall thickening of lesser curve and antrum of stomach. Tiny focus of extraluminal high attenuation supra posterior to the gastric antrum consistent with extravasated contrast. Small foci of gas adjacent to distal stomach and the distal aspect of the JP drain, could be related to gastric perforation or the drain. No additional extraluminal contrast collections. Small duodenal diverticula. Sigmoid diverticulosis. Remaining bowel loops unremarkable. Vascular/Lymphatic: Atherosclerotic calcifications aorta, iliac arteries, and femoral arteries without aneurysm. No adenopathy. Reproductive: Mild prostatic enlargement gland measuring 5.0 x 3.2 cm. Other: Infiltration of fat planes and upper abdomen related to recent surgery. Focal fluid collection identified lateral to the spleen, 9.8 x 3.5 x 8.9 cm, increased in size and demonstrating minimal marginal enhancement cannot exclude abscess. Tiny collection of fluid adjacent to spleen anteriorly 1.3 x 1.6 cm, may communicate with the larger  collection listed above. Tiny collection of fluid anterior to the mid stomach 4.1 x 1.0 cm image 29. No additional fluid collections. No free air. No hernia. Musculoskeletal: Osseous demineralization with degenerative disc disease changes of thoracolumbar spine. IMPRESSION: Tiny focus of extraluminal high attenuation superoposterior to the gastric antrum consistent with extravasated contrast and minimal leak from the site of gastric ulcer repair. Small foci of gas adjacent to the distal stomach and the distal aspect of the JP-drain could be related to leak or JP drain. Interval increase in size of a fluid collection lateral to the spleen, 9.8 x 3.5 x 8.9 cm, with minimal marginal enhancement cannot exclude abscess, questionably communicating with an adjacent 1.6 cm diameter collection. Tiny nonspecific collection of fluid anterior to the mid stomach 4.1 x 1.0 cm. Small bibasilar pleural effusions and minimal compressive atelectasis of the lower lobes. Sigmoid diverticulosis without evidence of diverticulitis. Mild prostatic enlargement with wall thickening of bladder question muscular hypertrophy due to chronic outlet obstruction. Aortic Atherosclerosis (ICD10-I70.0). Electronically Signed   By: Elta Guadeloupe  Thornton Papas M.D.   On: 07/06/2020 10:33   US RENAL  Result Date: 06/28/2020 CLINICAL DATA:  79 year old male with acute renal insufficiency. EXAM: RENAL / URINARY TRACT ULTRASOUND COMPLETE COMPARISON:  None. FINDINGS: Right Kidney: Renal measurements: 10.9 x 4.3 x 3.7 cm = volume: 85 mL. Mild parenchyma atrophy and increased echogenicity. No hydronephrosis or shadowing stone. There is a 3 cm upper pole cyst. Left Kidney: Renal measurements: 9.1 x 4.8 x 3.7 cm = volume: 85 mL. There is mild parenchyma atrophy and increased echogenicity. No hydronephrosis or shadowing stone. There is a 3 cm upper pole cyst and a 6 cm parapelvic cyst. Several way shin of the left kidney and cysts are limited due to suboptimal  visualization, body habitus, and overlying bowel gas. Bladder: Not seen. Other: None. IMPRESSION: 1. Mildly atrophic and echogenic kidneys in keeping with chronic kidney disease. No hydronephrosis or shadowing stone. 2. Bilateral renal cysts, suboptimally evaluated due to body habitus and bowel gas. Electronically Signed   By: Anner Crete M.D.   On: 06/28/2020 16:13   DG Chest Portable 1 View  Result Date: 07/06/2020 CLINICAL DATA:  PICC line placement EXAM: PORTABLE CHEST 1 VIEW COMPARISON:  06/28/2020 FINDINGS: Interval extubation and removal of NG tube. Left PICC line is been placed with the tip at the cavoatrial junction. Right central line is unchanged. Heart is normal size. Mild hyperinflation. No confluent opacities or effusions. IMPRESSION: Left PICC line tip at the cavoatrial junction. Interval extubation. Hyperinflation.  No acute cardiopulmonary disease. Electronically Signed   By: Rolm Baptise M.D.   On: 07/06/2020 19:10   DG Chest Port 1 View  Result Date: 06/28/2020 CLINICAL DATA:  Central catheter placement.  Hypoxia. EXAM: PORTABLE CHEST 1 VIEW COMPARISON:  June 28, 2020 study obtained earlier in the day FINDINGS: Central catheter tip is in the superior vena cava. Endotracheal tube tip is 6.0 cm above the carina. Nasogastric tube tip and side port are below the diaphragm. No pneumothorax. There is a small right pleural effusion. There is slight bibasilar atelectasis. Lungs elsewhere are clear. Heart size and pulmonary vascularity are normal. No adenopathy. There is degenerative change in each shoulder. IMPRESSION: Tube and catheter positions as described without pneumothorax. Small right pleural effusion with slight bibasilar atelectasis. No edema or airspace opacity. Stable cardiac silhouette. Electronically Signed   By: Lowella Grip III M.D.   On: 06/28/2020 15:18   DG CHEST PORT 1 VIEW  Result Date: 06/28/2020 CLINICAL DATA:  Shortness of breath EXAM: PORTABLE CHEST 1  VIEW COMPARISON:  Earlier same day FINDINGS: Endotracheal and partially imaged enteric tubes again identified. Stable interstitial prominence likely reflecting changes of COPD. No significant pleural effusion. No pneumothorax. Stable cardiomediastinal contours. IMPRESSION: Stable appearance. Interstitial prominence is nonspecific and could reflect chronic changes of COPD. Electronically Signed   By: Macy Mis M.D.   On: 06/28/2020 09:44   DG Chest Port 1 View  Result Date: 06/28/2020 CLINICAL DATA:  Intubation. EXAM: PORTABLE CHEST 1 VIEW COMPARISON:  One-view chest x-ray 06/27/2020 FINDINGS: Patient has been intubated. Endotracheal tube terminates 4.8 cm above the carina. Side port of the NG tube courses scratched at the side port of the NG tube is in the fundus the stomach. The heart size is normal. Atherosclerotic calcifications are present at the aortic arch. Interstitial and airspace opacities have slightly increased diffusely. No significant airspace consolidation is present. No pneumothorax is present. IMPRESSION: 1. Interval intubation and placement of NG tube. Placement is satisfactory as described  above. 2. Slight increase in interstitial and airspace disease diffusely. This is concerning for edema or infection. Electronically Signed   By: San Morelle M.D.   On: 06/28/2020 04:30   DG Chest Portable 1 View  Result Date: 06/27/2020 CLINICAL DATA:  Abdomen pain EXAM: PORTABLE CHEST 1 VIEW COMPARISON:  CT 06/27/2020 FINDINGS: The free air noted on CT is not well seen radiographically. No focal consolidation or effusion. Normal cardiomediastinal silhouette with aortic atherosclerosis. No pneumothorax. IMPRESSION: No active disease. The free air noted on CT is not well seen radiographically. Electronically Signed   By: Donavan Foil M.D.   On: 06/27/2020 22:44   DG UGI W SINGLE CM (SOL OR THIN BA)  Result Date: 07/01/2020 CLINICAL DATA:  Post exploratory laparotomy and Phillip Heal patch  of a perforated gastric ulcer at anterior wall of lesser curve EXAM: WATER SOLUBLE UPPER GI SERIES TECHNIQUE: Single-column upper GI series was performed using water soluble contrast. CONTRAST:  121mL ISOVUE-300 IOPAMIDOL (ISOVUE-300) INJECTION 61% orally COMPARISON:  CT abdomen pelvis 06/27/2020 FLUOROSCOPY TIME:  Fluoroscopy Time:  1 minutes 48 seconds Radiation Exposure Index (if provided by the fluoroscopic device): 32.0 mGy Number of Acquired Spot Images: 2 plus multiple fluoroscopic screen captures FINDINGS: Scout image demonstrates normal bowel gas pattern. Jackson-Pratt drain identified extending from duodenal bulb region along lesser curve. Stomach distends normally. Esophageal dysmotility noted. No gastric outlet obstruction. Mild edema is identified at distal gastric antrum especially at lesser curve. No contrast extravasation identified. No contrast accumulation along the surgical drain. Duodenum and visualized jejunal loops unremarkable. IMPRESSION: Edema at distal stomach corresponding to site of repair of a perforated gastric ulcer. No contrast extravasation or gastric outlet obstruction identified. Electronically Signed   By: Lavonia Dana M.D.   On: 07/01/2020 11:34   ECHOCARDIOGRAM COMPLETE  Result Date: 06/29/2020    ECHOCARDIOGRAM REPORT   Patient Name:   FORBES LOLL Date of Exam: 06/29/2020 Medical Rec #:  [id number]    Height:       68.0 in Accession #:    [id number]   Weight:       140.0 lb Date of Birth:  1941-03-31   BSA:          1.756 m Patient Age:    80 years     BP:           128/48 mmHg Patient Gender: M            HR:           80 bpm. Exam Location:  [facility] Procedure: 2D Echo Indications:    Dyspnea 786.09 / R06.00  History:        Patient has no prior history of Echocardiogram examinations.                 Risk Factors:Current Smoker. Acute Respiratory Failue, Gastric                 Perforation, CHB, Acute Kidney Injury.  Sonographer:    Leavy Cella RDCS (AE) Referring  Phys: [id number] [place]  1. Left ventricular ejection fraction, by estimation, is 55 to 60%. The left ventricle has normal function. The left ventricle has no regional wall motion abnormalities. There is mild left ventricular hypertrophy. Left ventricular diastolic parameters were normal.  2. Right ventricular systolic function is normal. The right ventricular size is normal.  3. The mitral valve is normal in structure. No evidence of mitral valve regurgitation. No evidence of mitral  stenosis.  4. The aortic valve is tricuspid. There is mild calcification of the aortic valve. There is mild thickening of the aortic valve. Aortic valve regurgitation is not visualized. No aortic stenosis is present.  5. At least moderate pulmonary HTN, limited evaluation due to poorly visualized IVC.  6. The inferior vena cava is normal in size with greater than 50% respiratory variability, suggesting right atrial pressure of 3 mmHg. FINDINGS  Left Ventricle: Left ventricular ejection fraction, by estimation, is 55 to 60%. The left ventricle has normal function. The left ventricle has no regional wall motion abnormalities. The left ventricular internal cavity size was normal in size. There is  mild left ventricular hypertrophy. Left ventricular diastolic parameters were normal. Right Ventricle: The right ventricular size is normal. No increase in right ventricular wall thickness. Right ventricular systolic function is normal. Left Atrium: Left atrial size was normal in size. Right Atrium: Right atrial size was normal in size. Pericardium: There is no evidence of pericardial effusion. Mitral Valve: The mitral valve is normal in structure. There is mild thickening of the mitral valve leaflet(s). There is mild calcification of the mitral valve leaflet(s). Mild mitral annular calcification. No evidence of mitral valve regurgitation. No evidence of mitral valve stenosis. Tricuspid Valve: The tricuspid valve is normal in  structure. Tricuspid valve regurgitation is mild . No evidence of tricuspid stenosis. Aortic Valve: The aortic valve is tricuspid. There is mild calcification of the aortic valve. There is mild thickening of the aortic valve. There is mild aortic valve annular calcification. Aortic valve regurgitation is not visualized. No aortic stenosis  is present. Aortic valve mean gradient measures 4.7 mmHg. Aortic valve peak gradient measures 8.7 mmHg. Aortic valve area, by VTI measures 2.23 cm. Pulmonic Valve: The pulmonic valve was not well visualized. Pulmonic valve regurgitation is not visualized. No evidence of pulmonic stenosis. Aorta: The aortic root is normal in size and structure. Pulmonary Artery: At least moderate pulmonary HTN, limited evaluation due to poorly visualized IVC. Venous: The inferior vena cava is normal in size with greater than 50% respiratory variability, suggesting right atrial pressure of 3 mmHg. IAS/Shunts: The interatrial septum was not well visualized.  LEFT VENTRICLE PLAX 2D LVIDd:         4.10 cm  Diastology LVIDs:         3.37 cm  LV e' medial:    13.40 cm/s LV PW:         1.10 cm  LV E/e' medial:  7.6 LV IVS:        1.09 cm  LV e' lateral:   11.70 cm/s LVOT diam:     1.90 cm  LV E/e' lateral: 8.7 LV SV:         57 LV SV Index:   33 LVOT Area:     2.84 cm  RIGHT VENTRICLE RV S prime:     10.00 cm/s TAPSE (M-mode): 2.3 cm LEFT ATRIUM             Index       RIGHT ATRIUM           Index LA diam:        3.10 cm 1.77 cm/m  RA Area:     14.30 cm LA Vol (A2C):   41.2 ml 23.46 ml/m RA Volume:   32.60 ml  18.56 ml/m LA Vol (A4C):   45.8 ml 26.08 ml/m LA Biplane Vol: 46.9 ml 26.70 ml/m  AORTIC VALVE AV Area (Vmax):  2.35 cm AV Area (Vmean):   2.29 cm AV Area (VTI):     2.23 cm AV Vmax:           147.68 cm/s AV Vmean:          100.148 cm/s AV VTI:            0.257 m AV Peak Grad:      8.7 mmHg AV Mean Grad:      4.7 mmHg LVOT Vmax:         122.52 cm/s LVOT Vmean:        80.995 cm/s LVOT  VTI:          0.202 m LVOT/AV VTI ratio: 0.79  AORTA Ao Root diam: 3.30 cm MITRAL VALVE                TRICUSPID VALVE MV Area (PHT): 3.65 cm     TR Peak grad:   44.4 mmHg MV Decel Time: 208 msec     TR Vmax:        333.00 cm/s MV E velocity: 102.00 cm/s MV A velocity: 44.30 cm/s   SHUNTS MV E/A ratio:  2.30         Systemic VTI:  0.20 m                             Systemic Diam: 1.90 cm Carlyle Dolly MD Electronically signed by Carlyle Dolly MD Signature Date/Time: 06/29/2020/11:25:24 AM    Final    CT IMAGE GUIDED DRAINAGE BY PERCUTANEOUS CATHETER  Result Date: 07/07/2020 INDICATION: Postop left upper quadrant subdiaphragmatic fluid collection EXAM: CT DRAINAGE LEFT UPPER QUADRANT FLUID COLLECTION MEDICATIONS: The patient is currently admitted to the hospital and receiving intravenous antibiotics. The antibiotics were administered within an appropriate time frame prior to the initiation of the procedure. ANESTHESIA/SEDATION: Fentanyl 50 mcg IV; Versed 1.0 mg IV Moderate Sedation Time:  15 MINUTES The patient was continuously monitored during the procedure by the interventional radiology nurse under my direct supervision. COMPLICATIONS: None immediate. PROCEDURE: Informed written consent was obtained from the [place] [facility] after a thorough discussion of the procedural risks, benefits and alternatives. All questions were addressed. Maximal Sterile Barrier Technique was utilized including caps, mask, sterile gowns, sterile gloves, sterile drape, hand hygiene and skin antiseptic. A timeout was performed prior to the initiation of the procedure. Previous imaging reviewed. Left upper quadrant perisplenic subdiaphragmatic fluid collection was localized. Overlying skin marked for a lower intercostal approach. Under sterile conditions and local anesthesia, an 18 gauge needle was advanced from a lateral lower intercostal approach. Needle position confirmed with CT. Syringe aspiration yielded purulent fluid.  Sample sent for culture. Guidewire inserted followed by tract dilatation insert a 10 French drain. Drain catheter position confirmed within the collection by CT. Catheter secured with a Prolene suture. Suction bulb connected. Sterile dressing applied. 20 cc purulent fluid aspirated. IMPRESSION: Successful CT-guided left upper quadrant subdiaphragmatic abscess drain placement. Culture sent. Electronically Signed   By: Jerilynn Mages.  Shick M.D.   On: 07/07/2020 15:22   [place] EKG SITE RITE  Result Date: 07/06/2020 If Site Rite image not attached, placement could not be confirmed due to current cardiac rhythm.   Micro Results   Recent Results (from the past 240 hour(s))  Aerobic/Anaerobic Culture (surgical/deep wound)     Status: None   Collection Time: 07/07/20  2:34 PM   Specimen: Abscess  Result Value Ref Range Status   Specimen  Description   Final    ABSCESS ABDOMEN Performed at [facility], [place] [address] [place], [place] [address]    Special Requests   Final    NONE Performed at [facility], [address], [place], [place] [address]    Gram Stain   Final    ABUNDANT WBC PRESENT,BOTH PMN AND MONONUCLEAR RARE GRAM POSITIVE COCCI    Culture   Final    FEW CORYNEBACTERIUM MINUTISSIMUM Standardized susceptibility testing for this organism is not available. NO ANAEROBES ISOLATED Performed at [facility], [place] [address], [place], [place] [address]    Report Status 07/12/2020 FINAL  Final       Today   Subjective    Wandell Scullion today has no new complaints, No fever  Or chills   No Nausea, Vomiting or Diarrhea Tolerating IV TPN well      Patient has been seen and examined prior to discharge   Objective   Blood pressure (!) 167/67, pulse 68, temperature 98.9 F (37.2 C), resp. rate 17, height 5\' 8"  (1.727 m), weight 66.4 kg, SpO2 96 %.   Intake/Output Summary (Last 24 hours) at 07/12/2020 1642 Last data filed at 07/12/2020 1300 Gross per 24 hour  Intake 935.83 ml   Output 1318 ml  Net -382.17 ml    Exam General exam: elderly chronically ill appearing male, awake, alert, NAD, Respiratory system: Fair air movement, mostly clear  cardiovascular system: normal S1 & S2 heard.  Gastrointestinal system: Abdomen is nondistended, soft and  wound clean and dry and intact. +BS,  Anterior abdominal wall JP drain in place without further bilious drainage -Left flank IR intra-abdominal drain in place with very scant purulent drainage Central nervous system: Alert and oriented , generalized weakness . No focal neurological deficits. Extremities: Pedal pulses are good. Skin: No rashes, lesions or ulcers Psychiatry: Affect is appropriate, mostly oriented occasional episodes of frustration and confusion MSK-left arm PICC line   Data Review   CBC w Diff:  Lab Results  Component Value Date   WBC 11.7 (H) 07/12/2020   HGB 8.4 (L) 07/12/2020   HCT 25.9 (L) 07/12/2020   PLT 583 (H) 07/12/2020   LYMPHOPCT 11 07/11/2020   BANDSPCT 0 07/11/2020   MONOPCT 5 07/11/2020   EOSPCT 3 07/11/2020   BASOPCT 0 07/11/2020   CMP:  Lab Results  Component Value Date   NA 134 (L) 07/12/2020   K 4.6 07/12/2020   CL 104 07/12/2020   CO2 22 07/12/2020   BUN 40 (H) 07/12/2020   CREATININE 1.27 (H) 07/12/2020   PROT 6.8 07/12/2020   ALBUMIN 2.0 (L) 07/12/2020   BILITOT 0.5 07/12/2020   ALKPHOS 208 (H) 07/12/2020   AST 48 (H) 07/12/2020   ALT 68 (H) 07/12/2020  . Total Discharge time is about 33 minutes  Roxan Hockey M.D on 07/12/2020 at 4:42 PM  Go to [email or web address] -  for contact info  [facility] - Office  [phone]

## 2020-07-12 NOTE — Progress Notes (Signed)
Report was attempted,but not successful unable to give report to [place] at [facility]. Charge nurse at the [facility] stated  they would give [place] a call back.Marland Kitchen

## 2020-07-12 NOTE — Progress Notes (Signed)
Report given to nurse at [facility]

## 2020-07-13 LAB — CBC
HCT: 27.5 % — ABNORMAL LOW (ref 39.0–52.0)
Hemoglobin: 9.2 g/dL — ABNORMAL LOW (ref 13.0–17.0)
MCH: 31 pg (ref 26.0–34.0)
MCHC: 33.5 g/dL (ref 30.0–36.0)
MCV: 92.6 fL (ref 80.0–100.0)
Platelets: 636 10*3/uL — ABNORMAL HIGH (ref 150–400)
RBC: 2.97 MIL/uL — ABNORMAL LOW (ref 4.22–5.81)
RDW: 15.1 % (ref 11.5–15.5)
WBC: 13.4 10*3/uL — ABNORMAL HIGH (ref 4.0–10.5)
nRBC: 0 % (ref 0.0–0.2)

## 2020-07-13 LAB — BASIC METABOLIC PANEL
Anion gap: 8 (ref 5–15)
BUN: 39 mg/dL — ABNORMAL HIGH (ref 8–23)
CO2: 22 mmol/L (ref 22–32)
Calcium: 9 mg/dL (ref 8.9–10.3)
Chloride: 105 mmol/L (ref 98–111)
Creatinine, Ser: 1.32 mg/dL — ABNORMAL HIGH (ref 0.61–1.24)
GFR, Estimated: 55 mL/min — ABNORMAL LOW (ref >60)
Glucose, Bld: 90 mg/dL (ref 70–99)
Potassium: 4.8 mmol/L (ref 3.5–5.1)
Sodium: 135 mmol/L (ref 135–145)

## 2020-07-14 LAB — BASIC METABOLIC PANEL
Anion gap: 6 (ref 5–15)
BUN: 34 mg/dL — ABNORMAL HIGH (ref 8–23)
CO2: 24 mmol/L (ref 22–32)
Calcium: 8.7 mg/dL — ABNORMAL LOW (ref 8.9–10.3)
Chloride: 105 mmol/L (ref 98–111)
Creatinine, Ser: 1.23 mg/dL (ref 0.61–1.24)
GFR, Estimated: 59 mL/min — ABNORMAL LOW (ref >60)
Glucose, Bld: 145 mg/dL — ABNORMAL HIGH (ref 70–99)
Potassium: 4.2 mmol/L (ref 3.5–5.1)
Sodium: 135 mmol/L (ref 135–145)

## 2020-07-14 LAB — CBC
HCT: 26 % — ABNORMAL LOW (ref 39.0–52.0)
Hemoglobin: 8.4 g/dL — ABNORMAL LOW (ref 13.0–17.0)
MCH: 30.1 pg (ref 26.0–34.0)
MCHC: 32.3 g/dL (ref 30.0–36.0)
MCV: 93.2 fL (ref 80.0–100.0)
Platelets: 568 10*3/uL — ABNORMAL HIGH (ref 150–400)
RBC: 2.79 MIL/uL — ABNORMAL LOW (ref 4.22–5.81)
RDW: 14.6 % (ref 11.5–15.5)
WBC: 11.2 10*3/uL — ABNORMAL HIGH (ref 4.0–10.5)
nRBC: 0 % (ref 0.0–0.2)

## 2020-07-14 NOTE — Consult Note (Signed)
Infectious Disease Consultation   Frederick [name]  IRW:[id number]  DOB: [birth year]  DOA: 07/12/2020  Requesting physician: Dr. Owens Shark  Reason for consultation: Antibiotic recommendations   History of Present Illness: Frederick [name] is an 79 y.o. male who initially presented with 2-week history of abdominal pain, intermittent in nature worse with food.  He apparently had not seen a physician in nearly 5 years.  He ate some chocolate on 06/27/2020 after which he had unrelenting and worsening epigastric abdominal pain with nausea without any emesis.  Because of his abdominal pain he presented to the emergency room at [facility] for further evaluation.  In the emergency room his blood pressure were on the lower side.  A CT of the abdomen and pelvis showed moderate free air in the upper abdomen with small amount of free fluid consistent with perforated viscus.  General surgery was consulted.  Patient was taken to the OR and underwent exploratory laparotomy with vascular omental patch placement.  Unfortunately there was difficulty extubating the patient postoperatively.  On the morning of 06/28/2020 he started developing hypotension.  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  He was briefly on dobutamine infusion.  He underwent expiratory laparotomy on 06/28/2020.  Repeat CT of the abdomen pelvis from 07/06/2020 showed small leak.  He continued to have bilious output from his JP drain.  He had worsening leukocytosis.  His peritoneal fluid cultures showed Candida glabrata which was treated with anidulafungin started on 07/05/2020.  On 07/08/2019 when he underwent CT-guided left upper quadrant subdiaphragmatic abscess drain placement and drain culture from 07/07/2020 with gram-positive cocci.  He also was started on treatment with IV Zosyn.  Regarding his heart block cardiology was consulted and they recommended to avoid beta-blockers and cardiac monitor post discharge.  Patient also had CKD stage  IV with some worsening which improved with IV hydration.  He also had elevated LFTs thought to be secondary to shock liver, intra-abdominal abscess which slowly improved.  Due to his complex medical problems he was transferred to [facility] and admitted here on 07/12/2020.  He has a JP drain in place.  He denies having any shortness of breath, chest pain, nausea, vomiting, diarrhea or dysuria at this time.  Review of Systems:  Review of systems negative except as mentioned above in the HPI.  Past Medical History: No past medical history on file.  Past Surgical History: Past Surgical History:  Procedure Laterality Date  . LAPAROTOMY N/A 06/28/2020   Procedure: EXPLORATORY LAPAROTOMY, graham patch;  Surgeon: Virl Cagey, MD;  Location: [facility];  Service: General;  Laterality: N/A;     Allergies:  No Known Allergies   Social History:  reports that he has been smoking. He has never used smokeless tobacco. No history on file for alcohol use and drug use.   Family History: Noncontributory to the present illness.  Physical Exam: Vitals: Temperature 98, pulse 69, respiratory rate 18, blood pressure 158/90, pulse oximetry 100% Constitutional: Alert and awake, oriented x3, not in any acute distress. Head: Atraumatic, normocephalic Eyes: PERLA, EOMI  ENMT: external ears and nose appear normal, normal hearing, Lips appears normal, moist oral mucosa Neck: neck appears normal, no masses  CVS: S1-S2 Respiratory: clear to auscultation bilaterally, no wheezing, rales or rhonchi. Respiratory effort normal. No accessory muscle use.  Abdomen: Soft, nontender, has dressing in place with JP drain.  Positive bowel sounds Musculoskeletal: Dry scaly skin  with old scabs Neuro: Debility with generalized weakness otherwise grossly nonfocal Psych: judgement and insight appear normal, stable mood and affect Skin: no rashes  Data reviewed:  I have personally reviewed following labs and  imaging studies Labs:  CBC: Recent Labs  Lab 07/08/20 0832 07/08/20 0832 07/09/20 0734 07/09/20 0734 07/10/20 0852 07/11/20 0830 07/12/20 1419 07/13/20 0424 07/14/20 0428  WBC 20.3*   < > 16.9*   < > 15.7* 15.0* 11.7* 13.4* 11.2*  NEUTROABS 15.4*  --  12.6*  --  11.1* 12.0*  --   --   --   HGB 10.1*   < > 8.8*   < > 8.8* 9.2* 8.4* 9.2* 8.4*  HCT 31.0*   < > 26.7*   < > 27.3* 28.2* 25.9* 27.5* 26.0*  MCV 93.9   < > 92.1   < > 92.9 93.1 91.8 92.6 93.2  PLT 622*   < > 633*   < > 656* 645* 583* 636* 568*   < > = values in this interval not displayed.    Basic Metabolic Panel: Recent Labs  Lab 07/08/20 0832 07/08/20 0832 07/09/20 0734 07/09/20 0734 07/10/20 0852 07/10/20 0852 07/11/20 0827 07/11/20 0830 07/11/20 0830 07/12/20 1419 07/12/20 1419 07/13/20 0424 07/14/20 0428  NA 137   < > 136   < > 136  --   --  135  --  134*  --  135 135  K 4.2   < > 4.3   < > 4.4   < >  --  4.7   < > 4.6   < > 4.8 4.2  CL 105   < > 106   < > 106  --   --  105  --  104  --  105 105  CO2 23   < > 20*   < > 21*  --   --  21*  --  22  --  22 24  GLUCOSE 112*   < > 127*   < > 120*  --   --  128*  --  125*  --  90 145*  BUN 36*   < > 39*   < > 38*  --   --  40*  --  40*  --  39* 34*  CREATININE 1.17   < > 1.21   < > 1.18  --   --  1.20  --  1.27*  --  1.32* 1.23  CALCIUM 8.6*   < > 8.4*   < > 8.6*  --   --  8.6*  --  8.5*  --  9.0 8.7*  MG 2.4  --  2.3  --  2.3  --   --   --   --   --   --   --   --   PHOS  --   --   --   --   --   --  3.9  --   --   --   --   --   --    < > = values in this interval not displayed.   GFR Estimated Creatinine Clearance: 45 mL/min (by C-G formula based on SCr of 1.23 mg/dL). Liver Function Tests: Recent Labs  Lab 07/08/20 0832 07/09/20 0734 07/10/20 0852 07/11/20 0830 07/12/20 1419  AST 50* 43* 52* 54* 48*  ALT 55* 52* 63* 68* 68*  ALKPHOS 221* 202* 204* 207* 208*  BILITOT 0.7 0.6 0.6 0.5 0.5  PROT  6.5 6.3* 6.6 6.7 6.8  ALBUMIN 2.0* 2.0* 2.0* 2.1*  2.0*   No results for input(s): LIPASE, AMYLASE in the last 168 hours. No results for input(s): AMMONIA in the last 168 hours. Coagulation profile No results for input(s): INR, PROTIME in the last 168 hours.  Cardiac Enzymes: No results for input(s): CKTOTAL, CKMB, CKMBINDEX, TROPONINI in the last 168 hours. BNP: Invalid input(s): POCBNP CBG: Recent Labs  Lab 07/11/20 2026 07/12/20 0411 07/12/20 0723 07/12/20 1105 07/12/20 1617  GLUCAP 127* 122* 120* 130* 108*   D-Dimer No results for input(s): DDIMER in the last 72 hours. Hgb A1c No results for input(s): HGBA1C in the last 72 hours. Lipid Profile No results for input(s): CHOL, HDL, LDLCALC, TRIG, CHOLHDL, LDLDIRECT in the last 72 hours. Thyroid function studies No results for input(s): TSH, T4TOTAL, T3FREE, THYROIDAB in the last 72 hours.  Invalid input(s): FREET3 Anemia work up No results for input(s): VITAMINB12, FOLATE, FERRITIN, TIBC, IRON, RETICCTPCT in the last 72 hours. Urinalysis    Component Value Date/Time   COLORURINE YELLOW 06/28/2020 1528   APPEARANCEUR CLOUDY (A) 06/28/2020 1528   LABSPEC 1.025 06/28/2020 1528   PHURINE 5.0 06/28/2020 1528   GLUCOSEU NEGATIVE 06/28/2020 1528   HGBUR NEGATIVE 06/28/2020 [place] 06/28/2020 [place] 06/28/2020 1528   PROTEINUR 30 (A) 06/28/2020 1528   NITRITE NEGATIVE 06/28/2020 [place] 06/28/2020 1528     Microbiology Recent Results (from the past 240 hour(s))  Aerobic/Anaerobic Culture (surgical/deep wound)     Status: None   Collection Time: 07/07/20  2:34 PM   Specimen: Abscess  Result Value Ref Range Status   Specimen Description   Final    ABSCESS ABDOMEN Performed at [facility], [place] [address] [place], [place] [address]    Special Requests   Final    NONE Performed at [facility], [address] [place], [place] [address]    Gram Stain   Final    ABUNDANT WBC PRESENT,BOTH PMN AND  MONONUCLEAR RARE GRAM POSITIVE COCCI    Culture   Final    FEW CORYNEBACTERIUM MINUTISSIMUM Standardized susceptibility testing for this organism is not available. NO ANAEROBES ISOLATED Performed at [facility], [place] [address], [place], [place] [address]    Report Status 07/12/2020 FINAL  Final    Inpatient Medications:   Please see MAR   Radiological Exams on Admission: No results found.  Impression/Recommendations Active Problems: Gastric perforation with peritonitis Status post exploratory laparotomy with post abdominal wound, JP drain Fungal peritonitis Intra-abdominal abscess Acute on chronic renal failure AV block Protein calorie malnutrition Anemia  Gastric perforation with peritonitis: Patient presented with abdominal pain to [facility] emergency room.  He is status post surgery with exploratory laparotomy and omental patch placed for the gastric ulcer perforation.  Currently has JP drain in place.  He is on IV Zosyn.  Recommend to continue with a tentative end of date 07/18/2020 pending improvement.  Status post pressure laparotomy with post abdominal wound, JP drain: Continue local wound care, surgery consulted by the primary team for management of the JP drain.  Fungal peritonitis: Patient had intraoperative cultures that grew Candida glabrata.  He is currently on treatment with Eraxis. Plan to treat for a total duration of 2 weeks with tentative end date 07/19/2020  Intra-abdominal abscess: On 07/08/2019 when he underwent CT-guided left upper quadrant subdiaphragmatic abscess drain placement and drain culture from 07/07/2020 with gram-positive cocci  which per report was few Corynebacterium minutissimum.  He has been on treatment with IV Zosyn.  He is afebrile and stable at this time and tolerating the Zosyn well.  Therefore will complete treatment with IV Zosyn.  However, if he starts having any worsening fevers we may need to consider sending pancultures and add  IV vancomycin for gram-positive coverage.  Monitor drain output.  Acute on chronic renal failure: Continue to monitor BUN/trending closely.  Antibiotics renally dosed.  Improving with IV fluids.  Avoid nephrotoxic medications.  AV block: Avoid beta-blockers per cardiology recommendations at the outside facility.  He is on telemetry.  He denies having any chest pain at this time.  Protein calorie malnutrition: Management per the primary team.  Anemia: Continue to monitor hemoglobin.  Further management per the primary team.  Thank you for this consultation.  Plan of care discussed with the patient, primary team and pharmacy.  Yaakov Guthrie M.D. 07/14/2020, 4:41 PM

## 2020-07-15 LAB — MAGNESIUM: Magnesium: 2.1 mg/dL (ref 1.7–2.4)

## 2020-07-15 LAB — TRIGLYCERIDES: Triglycerides: 66 mg/dL (ref <150)

## 2020-07-15 LAB — PHOSPHORUS: Phosphorus: 3 mg/dL (ref 2.5–4.6)

## 2020-07-17 ENCOUNTER — Other Ambulatory Visit (HOSPITAL_COMMUNITY): Payer: POS

## 2020-07-17 LAB — COMPREHENSIVE METABOLIC PANEL
ALT: 84 U/L — ABNORMAL HIGH (ref 0–44)
AST: 91 U/L — ABNORMAL HIGH (ref 15–41)
Albumin: 2.1 g/dL — ABNORMAL LOW (ref 3.5–5.0)
Alkaline Phosphatase: 182 U/L — ABNORMAL HIGH (ref 38–126)
Anion gap: 8 (ref 5–15)
BUN: 26 mg/dL — ABNORMAL HIGH (ref 8–23)
CO2: 27 mmol/L (ref 22–32)
Calcium: 9.1 mg/dL (ref 8.9–10.3)
Chloride: 99 mmol/L (ref 98–111)
Creatinine, Ser: 1.07 mg/dL (ref 0.61–1.24)
GFR, Estimated: >60 mL/min (ref >60)
Glucose, Bld: 123 mg/dL — ABNORMAL HIGH (ref 70–99)
Potassium: 4.2 mmol/L (ref 3.5–5.1)
Sodium: 134 mmol/L — ABNORMAL LOW (ref 135–145)
Total Bilirubin: 0.5 mg/dL (ref 0.3–1.2)
Total Protein: 7.3 g/dL (ref 6.5–8.1)

## 2020-07-17 LAB — CBC
HCT: 29.6 % — ABNORMAL LOW (ref 39.0–52.0)
Hemoglobin: 9.8 g/dL — ABNORMAL LOW (ref 13.0–17.0)
MCH: 30.9 pg (ref 26.0–34.0)
MCHC: 33.1 g/dL (ref 30.0–36.0)
MCV: 93.4 fL (ref 80.0–100.0)
Platelets: 538 10*3/uL — ABNORMAL HIGH (ref 150–400)
RBC: 3.17 MIL/uL — ABNORMAL LOW (ref 4.22–5.81)
RDW: 14.6 % (ref 11.5–15.5)
WBC: 11.4 10*3/uL — ABNORMAL HIGH (ref 4.0–10.5)
nRBC: 0 % (ref 0.0–0.2)

## 2020-07-18 LAB — PHOSPHORUS: Phosphorus: 3.6 mg/dL (ref 2.5–4.6)

## 2020-07-18 LAB — MAGNESIUM: Magnesium: 2.1 mg/dL (ref 1.7–2.4)

## 2020-07-19 ENCOUNTER — Ambulatory Visit: Payer: Self-pay | Admitting: Cardiology

## 2020-07-20 LAB — BASIC METABOLIC PANEL
Anion gap: 4 — ABNORMAL LOW (ref 5–15)
BUN: 31 mg/dL — ABNORMAL HIGH (ref 8–23)
CO2: 29 mmol/L (ref 22–32)
Calcium: 8.9 mg/dL (ref 8.9–10.3)
Chloride: 100 mmol/L (ref 98–111)
Creatinine, Ser: 1.23 mg/dL (ref 0.61–1.24)
GFR, Estimated: 59 mL/min — ABNORMAL LOW (ref >60)
Glucose, Bld: 108 mg/dL — ABNORMAL HIGH (ref 70–99)
Potassium: 4.5 mmol/L (ref 3.5–5.1)
Sodium: 133 mmol/L — ABNORMAL LOW (ref 135–145)

## 2020-07-20 LAB — CBC
HCT: 27.9 % — ABNORMAL LOW (ref 39.0–52.0)
Hemoglobin: 9 g/dL — ABNORMAL LOW (ref 13.0–17.0)
MCH: 30.5 pg (ref 26.0–34.0)
MCHC: 32.3 g/dL (ref 30.0–36.0)
MCV: 94.6 fL (ref 80.0–100.0)
Platelets: 437 10*3/uL — ABNORMAL HIGH (ref 150–400)
RBC: 2.95 MIL/uL — ABNORMAL LOW (ref 4.22–5.81)
RDW: 14.9 % (ref 11.5–15.5)
WBC: 12.1 10*3/uL — ABNORMAL HIGH (ref 4.0–10.5)
nRBC: 0 % (ref 0.0–0.2)

## 2020-07-20 LAB — MAGNESIUM: Magnesium: 2.2 mg/dL (ref 1.7–2.4)

## 2020-07-21 LAB — PHOSPHORUS: Phosphorus: 3.5 mg/dL (ref 2.5–4.6)

## 2020-07-21 LAB — MAGNESIUM: Magnesium: 2.2 mg/dL (ref 1.7–2.4)

## 2020-07-22 LAB — TRIGLYCERIDES: Triglycerides: 127 mg/dL (ref <150)

## 2020-07-24 LAB — URINALYSIS, ROUTINE W REFLEX MICROSCOPIC
Bilirubin Urine: NEGATIVE
Glucose, UA: NEGATIVE mg/dL
Ketones, ur: NEGATIVE mg/dL
Leukocytes,Ua: NEGATIVE
Nitrite: NEGATIVE
Protein, ur: NEGATIVE mg/dL
Specific Gravity, Urine: 1.017 (ref 1.005–1.030)
pH: 5 (ref 5.0–8.0)

## 2020-07-24 LAB — BASIC METABOLIC PANEL
Anion gap: 8 (ref 5–15)
BUN: 33 mg/dL — ABNORMAL HIGH (ref 8–23)
CO2: 26 mmol/L (ref 22–32)
Calcium: 9.3 mg/dL (ref 8.9–10.3)
Chloride: 99 mmol/L (ref 98–111)
Creatinine, Ser: 1.19 mg/dL (ref 0.61–1.24)
GFR, Estimated: >60 mL/min (ref >60)
Glucose, Bld: 106 mg/dL — ABNORMAL HIGH (ref 70–99)
Potassium: 6 mmol/L — ABNORMAL HIGH (ref 3.5–5.1)
Sodium: 133 mmol/L — ABNORMAL LOW (ref 135–145)

## 2020-07-24 LAB — CBC
HCT: 30.7 % — ABNORMAL LOW (ref 39.0–52.0)
Hemoglobin: 10.2 g/dL — ABNORMAL LOW (ref 13.0–17.0)
MCH: 30.9 pg (ref 26.0–34.0)
MCHC: 33.2 g/dL (ref 30.0–36.0)
MCV: 93 fL (ref 80.0–100.0)
Platelets: 409 10*3/uL — ABNORMAL HIGH (ref 150–400)
RBC: 3.3 MIL/uL — ABNORMAL LOW (ref 4.22–5.81)
RDW: 14.7 % (ref 11.5–15.5)
WBC: 14.5 10*3/uL — ABNORMAL HIGH (ref 4.0–10.5)
nRBC: 0 % (ref 0.0–0.2)

## 2020-07-24 LAB — PHOSPHORUS: Phosphorus: 4.3 mg/dL (ref 2.5–4.6)

## 2020-07-24 LAB — MAGNESIUM: Magnesium: 2.2 mg/dL (ref 1.7–2.4)

## 2020-07-24 LAB — POTASSIUM: Potassium: 4.8 mmol/L (ref 3.5–5.1)

## 2020-07-25 LAB — URINE CULTURE: Culture: NO GROWTH

## 2020-07-25 LAB — POTASSIUM: Potassium: 4.8 mmol/L (ref 3.5–5.1)

## 2020-07-27 LAB — PHOSPHORUS: Phosphorus: 3.9 mg/dL (ref 2.5–4.6)

## 2020-07-27 LAB — SARS CORONAVIRUS 2 (TAT 6-24 HRS): SARS Coronavirus 2: NEGATIVE

## 2020-07-27 LAB — MAGNESIUM: Magnesium: 2.3 mg/dL (ref 1.7–2.4)

## 2020-08-03 DIAGNOSIS — I82409 Acute embolism and thrombosis of unspecified deep veins of unspecified lower extremity: Secondary | ICD-10-CM

## 2020-08-03 DIAGNOSIS — I2699 Other pulmonary embolism without acute cor pulmonale: Secondary | ICD-10-CM

## 2020-08-03 HISTORY — DX: Other pulmonary embolism without acute cor pulmonale: I26.99

## 2020-08-03 HISTORY — DX: Acute embolism and thrombosis of unspecified deep veins of unspecified lower extremity: I82.409

## 2020-08-16 ENCOUNTER — Ambulatory Visit: Payer: Medicare Other | Admitting: General Surgery

## 2020-08-17 ENCOUNTER — Telehealth (INDEPENDENT_AMBULATORY_CARE_PROVIDER_SITE_OTHER): Payer: Self-pay | Admitting: General Surgery

## 2020-08-17 DIAGNOSIS — K255 Chronic or unspecified gastric ulcer with perforation: Secondary | ICD-10-CM

## 2020-08-17 NOTE — Telephone Encounter (Signed)
[facility]  Called by OSH physician Dr. Bettye Boeck, Hospitalist at [facility] after passing out at home and hypotension.  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 He was on anticoagulation at discharge.  Patient had Ex lap and omental patch with drain placement 10/26 and had a leak post operatively on his follow up UGI. Repeat after bowel rest and TPN demonstrated no leak but he still had some old fluid in the area and the drain remained in place.  He also underwent IR drain placement in the LUQ for an abscess. He was discharged to [place] and we were told they would be able to remove the drain and care for the patient but at least one drain is still in place.  He was lost to follow up otherwise. He has been discharged from [facility] now.   He needs his drain removed and also needs an EGD to evaluate. Will see him tomorrow and get the drain out.  Have asked office to get any imaging reports from [facility] if he had a CT.   Curlene Labrum, MD [facility] [address] [place], [place] [address] [phone] (office)

## 2020-08-18 ENCOUNTER — Ambulatory Visit (INDEPENDENT_AMBULATORY_CARE_PROVIDER_SITE_OTHER): Payer: Self-pay | Admitting: General Surgery

## 2020-08-18 ENCOUNTER — Other Ambulatory Visit: Payer: Self-pay

## 2020-08-18 ENCOUNTER — Encounter: Payer: Self-pay | Admitting: General Surgery

## 2020-08-18 VITALS — BP 94/61 | HR 76 | Temp 97.4°F | Resp 12 | Ht 68.0 in | Wt 148.0 lb

## 2020-08-18 DIAGNOSIS — K1370 Unspecified lesions of oral mucosa: Secondary | ICD-10-CM

## 2020-08-18 DIAGNOSIS — K255 Chronic or unspecified gastric ulcer with perforation: Secondary | ICD-10-CM

## 2020-08-18 DIAGNOSIS — I2699 Other pulmonary embolism without acute cor pulmonale: Secondary | ICD-10-CM

## 2020-08-18 NOTE — Patient Instructions (Signed)
Increase diet as tolerated. Monitor for bleeding. Keep bandage on for 2 days and then remove.  Adhesive remover if needed. Ok to shower.  Referral to GI for EGD to assess the stomach and ensure ulcer healed and no other concerning area like a cancer. Continue protonix.

## 2020-08-18 NOTE — Progress Notes (Signed)
[facility]   HPI:  [name] presents to clinic for post-op follow-up evaluation after ex lap and omental patch for gastric perforation. He was lost to follow up after going to [facility] and then the [facility]. He was just discharged from [facility] with a DVT/PE and is on eliquis that was just started.   He says he is feeling better and is ready to get the drain out. He has the IR drain still which still had a fluid collection and was left in place to be removed at Rehab which we were told they would do but unfortunately this did not happen.   Review of Systems:  IR drain LUQ in place, no drainage, serous fluid <1cc in bulb All other review of systems: otherwise negative   Vital Signs:  BP 94/61    Pulse 76    Temp (!) 97.4 F (36.3 C) (Oral)    Resp 12    Ht 5\' 8"  (1.727 m)    Wt 148 lb (67.1 kg)    SpO2 96%    BMI 22.50 kg/m    Physical Exam:  Physical Exam Vitals reviewed.  Constitutional:      Appearance: He is cachectic.  Cardiovascular:     Rate and Rhythm: Normal rate.  Pulmonary:     Effort: Pulmonary effort is normal.  Abdominal:     General: There is no distension.     Palpations: Abdomen is soft.     Tenderness: There is no abdominal tenderness.     Comments: IR drain LUQ without drainage or erythema around, serous fluid in bulb < 1cc  Skin:    General: Skin is warm.  Neurological:     Mental Status: He is alert.     Assessment:  79 y.o. yo Male s/p Ex lap omental patch, drain placement for gastric perforation. had IR drain placed for abscess after surgery and this was never discontinued in rehab. Discussed removing today as it has been in 1 month and the collection was small on CT 1 month ago. Discussed risk of bleeding with the Eliquis but should be minor given the track is well formed. He is afraid he is going to pull it out accidentally which will be more dangerous and potential for bleeding higher. Drain was removed without issues  and dry gauze dressing placed.   Plan:  Increase diet as tolerated. Monitor for bleeding. Keep bandage on for 2 days and then remove.  Adhesive remover if needed. Ok to shower.  Referral to GI for EGD to assess the stomach and ensure ulcer healed and no other concerning area like a cancer. Continue protonix.   Future Appointments  Date Time Provider [place]  09/29/2020  1:15 PM Virl Cagey, MD [facility] None    All of the above recommendations were discussed with the patient and patient's family Suanne Marker POA), and all of patient's and family's questions were answered to their expressed satisfaction.  Curlene Labrum, MD [facility] [address] [place], [place] [address] [phone] (office)

## 2020-08-19 ENCOUNTER — Other Ambulatory Visit: Payer: Self-pay | Admitting: Family Medicine

## 2020-08-19 DIAGNOSIS — K255 Chronic or unspecified gastric ulcer with perforation: Secondary | ICD-10-CM

## 2020-09-05 ENCOUNTER — Other Ambulatory Visit (INDEPENDENT_AMBULATORY_CARE_PROVIDER_SITE_OTHER): Payer: Self-pay | Admitting: Gastroenterology

## 2020-09-14 ENCOUNTER — Other Ambulatory Visit (INDEPENDENT_AMBULATORY_CARE_PROVIDER_SITE_OTHER): Payer: Self-pay

## 2020-09-14 ENCOUNTER — Encounter (INDEPENDENT_AMBULATORY_CARE_PROVIDER_SITE_OTHER): Payer: Self-pay

## 2020-09-14 DIAGNOSIS — K3189 Other diseases of stomach and duodenum: Secondary | ICD-10-CM

## 2020-09-15 ENCOUNTER — Encounter (INDEPENDENT_AMBULATORY_CARE_PROVIDER_SITE_OTHER): Payer: Self-pay

## 2020-09-15 NOTE — Patient Instructions (Signed)
Frederick [name]  09/15/2020     @PREFPERIOPPHARMACY @   Your procedure is scheduled on  09/21/2020.  Report to [facility] at  [phone]  A.M.  Call this number if you have problems the morning of surgery:  [phone]   Remember:  Follow the diet instructions given to you by the office.                       Take these medicines the morning of surgery with A SIP OF WATER  protonix.    Do not wear jewelry, make-up or nail polish.  Do not wear lotions, powders, or perfumes, or deodorant. Please brush your teeth.  Do not shave 48 hours prior to surgery.  Men may shave face and neck.  Do not bring valuables to the hospital.  [facility] is not responsible for any belongings or valuables.  Contacts, dentures or bridgework may not be worn into surgery.  Leave your suitcase in the car.  After surgery it may be brought to your room.  For patients admitted to the hospital, discharge time will be determined by your treatment team.  Patients discharged the day of surgery will not be allowed to drive home.   Name and phone number of your driver:   Family   Special instructions: DO NOT smoke the morning of your procedure.   Your last doses of eliquis and plavix should be 09/18/2020.  Please read over the following fact sheets that you were given. Anesthesia Post-op Instructions and Care and Recovery After Surgery       Upper Endoscopy, Adult, Care After This sheet gives you information about how to care for yourself after your procedure. Your health care provider may also give you more specific instructions. If you have problems or questions, contact your health care provider. What can I expect after the procedure? After the procedure, it is common to have:  A sore throat.  Mild stomach pain or discomfort.  Bloating.  Nausea. Follow these instructions at home:  Follow instructions from your health care provider about what to eat or drink after your procedure.  Return  to your normal activities as told by your health care provider. Ask your health care provider what activities are safe for you.  Take over-the-counter and prescription medicines only as told by your health care provider.  If you were given a sedative during the procedure, it can affect you for several hours. Do not drive or operate machinery until your health care provider says that it is safe.  Keep all follow-up visits as told by your health care provider. This is important.   Contact a health care provider if you have:  A sore throat that lasts longer than one day.  Trouble swallowing. Get help right away if:  You vomit blood or your vomit looks like coffee grounds.  You have: ? A fever. ? Bloody, black, or tarry stools. ? A severe sore throat or you cannot swallow. ? Difficulty breathing. ? Severe pain in your chest or abdomen. Summary  After the procedure, it is common to have a sore throat, mild stomach discomfort, bloating, and nausea.  If you were given a sedative during the procedure, it can affect you for several hours. Do not drive or operate machinery until your health care provider says that it is safe.  Follow instructions from your health care provider about what to eat or drink after your procedure.  Return to your normal activities as told by your health care provider. This information is not intended to replace advice given to you by your health care provider. Make sure you discuss any questions you have with your health care provider. Document Revised: 08/18/2019 Document Reviewed: 01/20/2018 Elsevier Patient Education  2021 [place] After This sheet gives you information about how to care for yourself after your procedure. Your health care provider may also give you more specific instructions. If you have problems or questions, contact your health care provider. What can I expect after the procedure? After the procedure,  it is common to have:  Tiredness.  Forgetfulness about what happened after the procedure.  Impaired judgment for important decisions.  Nausea or vomiting.  Some difficulty with balance. Follow these instructions at home: For the time period you were told by your health care provider:  Rest as needed.  Do not participate in activities where you could fall or become injured.  Do not drive or use machinery.  Do not drink alcohol.  Do not take sleeping pills or medicines that cause drowsiness.  Do not make important decisions or sign legal documents.  Do not take care of children on your own.      Eating and drinking  Follow the diet that is recommended by your health care provider.  Drink enough fluid to keep your urine pale yellow.  If you vomit: ? Drink water, juice, or soup when you can drink without vomiting. ? Make sure you have little or no nausea before eating solid foods. General instructions  Have a responsible adult stay with you for the time you are told. It is important to have someone help care for you until you are awake and alert.  Take over-the-counter and prescription medicines only as told by your health care provider.  If you have sleep apnea, surgery and certain medicines can increase your risk for breathing problems. Follow instructions from your health care provider about wearing your sleep device: ? Anytime you are sleeping, including during daytime naps. ? While taking prescription pain medicines, sleeping medicines, or medicines that make you drowsy.  Avoid smoking.  Keep all follow-up visits as told by your health care provider. This is important. Contact a health care provider if:  You keep feeling nauseous or you keep vomiting.  You feel light-headed.  You are still sleepy or having trouble with balance after 24 hours.  You develop a rash.  You have a fever.  You have redness or swelling around the IV site. Get help right away  if:  You have trouble breathing.  You have new-onset confusion at home. Summary  For several hours after your procedure, you may feel tired. You may also be forgetful and have poor judgment.  Have a responsible adult stay with you for the time you are told. It is important to have someone help care for you until you are awake and alert.  Rest as told. Do not drive or operate machinery. Do not drink alcohol or take sleeping pills.  Get help right away if you have trouble breathing, or if you suddenly become confused. This information is not intended to replace advice given to you by your health care provider. Make sure you discuss any questions you have with your health care provider. Document Revised: 05/05/2020 Document Reviewed: 07/23/2019 Elsevier Patient Education  2021 [employer].

## 2020-09-19 ENCOUNTER — Encounter (HOSPITAL_COMMUNITY)
Admission: RE | Admit: 2020-09-19 | Discharge: 2020-09-19 | Disposition: A | Discharge status: Home / Self Care | Payer: POS | Source: Ambulatory Visit | Attending: Internal Medicine | Admitting: Internal Medicine

## 2020-09-19 ENCOUNTER — Other Ambulatory Visit (HOSPITAL_COMMUNITY): Payer: POS

## 2020-09-20 ENCOUNTER — Other Ambulatory Visit (HOSPITAL_COMMUNITY)
Admission: RE | Admit: 2020-09-20 | Discharge: 2020-09-20 | Disposition: A | Discharge status: Home / Self Care | Payer: POS | Source: Ambulatory Visit | Attending: Internal Medicine | Admitting: Internal Medicine

## 2020-09-20 ENCOUNTER — Encounter (HOSPITAL_COMMUNITY)
Admission: RE | Admit: 2020-09-20 | Discharge: 2020-09-20 | Disposition: A | Discharge status: Home / Self Care | Payer: POS | Source: Ambulatory Visit | Attending: Internal Medicine | Admitting: Internal Medicine

## 2020-09-20 ENCOUNTER — Other Ambulatory Visit: Payer: Self-pay

## 2020-09-20 ENCOUNTER — Encounter (HOSPITAL_COMMUNITY): Payer: Self-pay | Admitting: Anesthesiology

## 2020-09-20 ENCOUNTER — Encounter (HOSPITAL_COMMUNITY): Payer: Self-pay

## 2020-09-20 DIAGNOSIS — K3189 Other diseases of stomach and duodenum: Secondary | ICD-10-CM

## 2020-09-20 DIAGNOSIS — Z20822 Contact with and (suspected) exposure to covid-19: Secondary | ICD-10-CM | POA: Insufficient documentation

## 2020-09-20 DIAGNOSIS — Z01812 Encounter for preprocedural laboratory examination: Secondary | ICD-10-CM | POA: Insufficient documentation

## 2020-09-20 HISTORY — DX: Gastric ulcer, unspecified as acute or chronic, without hemorrhage or perforation: K25.9

## 2020-09-20 HISTORY — DX: Gastro-esophageal reflux disease without esophagitis: K21.9

## 2020-09-20 LAB — CBC WITH DIFFERENTIAL/PLATELET
Abs Immature Granulocytes: 0.04 10*3/uL (ref 0.00–0.07)
Basophils Absolute: 0.1 10*3/uL (ref 0.0–0.1)
Basophils Relative: 1 %
Eosinophils Absolute: 0.4 10*3/uL (ref 0.0–0.5)
Eosinophils Relative: 4 %
HCT: 29.7 % — ABNORMAL LOW (ref 39.0–52.0)
Hemoglobin: 9.3 g/dL — ABNORMAL LOW (ref 13.0–17.0)
Immature Granulocytes: 0 %
Lymphocytes Relative: 34 %
Lymphs Abs: 3.5 10*3/uL (ref 0.7–4.0)
MCH: 31.6 pg (ref 26.0–34.0)
MCHC: 31.3 g/dL (ref 30.0–36.0)
MCV: 101 fL — ABNORMAL HIGH (ref 80.0–100.0)
Monocytes Absolute: 0.5 10*3/uL (ref 0.1–1.0)
Monocytes Relative: 5 %
Neutro Abs: 5.8 10*3/uL (ref 1.7–7.7)
Neutrophils Relative %: 56 %
Platelets: 382 10*3/uL (ref 150–400)
RBC: 2.94 MIL/uL — ABNORMAL LOW (ref 4.22–5.81)
RDW: 18.8 % — ABNORMAL HIGH (ref 11.5–15.5)
WBC: 10.3 10*3/uL (ref 4.0–10.5)
nRBC: 0 % (ref 0.0–0.2)

## 2020-09-20 LAB — BASIC METABOLIC PANEL
Anion gap: 7 (ref 5–15)
BUN: 24 mg/dL — ABNORMAL HIGH (ref 8–23)
CO2: 26 mmol/L (ref 22–32)
Calcium: 8.7 mg/dL — ABNORMAL LOW (ref 8.9–10.3)
Chloride: 106 mmol/L (ref 98–111)
Creatinine, Ser: 1.35 mg/dL — ABNORMAL HIGH (ref 0.61–1.24)
GFR, Estimated: 53 mL/min — ABNORMAL LOW (ref >60)
Glucose, Bld: 91 mg/dL (ref 70–99)
Potassium: 5.1 mmol/L (ref 3.5–5.1)
Sodium: 139 mmol/L (ref 135–145)

## 2020-09-20 LAB — SARS CORONAVIRUS 2 (TAT 6-24 HRS): SARS Coronavirus 2: NEGATIVE

## 2020-09-21 ENCOUNTER — Encounter (HOSPITAL_COMMUNITY): Admission: RE | Payer: Self-pay | Source: Home / Self Care

## 2020-09-21 ENCOUNTER — Ambulatory Visit (HOSPITAL_COMMUNITY): Admission: RE | Admit: 2020-09-21 | Payer: POS | Source: Home / Self Care | Admitting: Internal Medicine

## 2020-09-21 ENCOUNTER — Telehealth (INDEPENDENT_AMBULATORY_CARE_PROVIDER_SITE_OTHER): Payer: Self-pay | Admitting: *Deleted

## 2020-09-21 SURGERY — ESOPHAGOGASTRODUODENOSCOPY (EGD) WITH PROPOFOL
Anesthesia: Monitor Anesthesia Care

## 2020-09-21 NOTE — Telephone Encounter (Signed)
Per Dr Laural Golden, patient was up all night with diarrhea and canceled procedure

## 2020-09-21 NOTE — Progress Notes (Signed)
Caregiver called this am & stated that patient was sick & unable to have scheduled procedure.  Informed caregiver to reschedule at their convenience with doctor's office. Dr. Laural Golden aware.

## 2020-09-29 ENCOUNTER — Ambulatory Visit (INDEPENDENT_AMBULATORY_CARE_PROVIDER_SITE_OTHER): Payer: POS | Admitting: General Surgery

## 2020-09-29 ENCOUNTER — Encounter: Payer: Self-pay | Admitting: General Surgery

## 2020-09-29 ENCOUNTER — Other Ambulatory Visit: Payer: Self-pay

## 2020-09-29 VITALS — BP 91/45 | HR 63 | Temp 97.1°F | Resp 16 | Ht 68.0 in | Wt 157.0 lb

## 2020-09-29 DIAGNOSIS — K255 Chronic or unspecified gastric ulcer with perforation: Secondary | ICD-10-CM

## 2020-09-29 NOTE — Progress Notes (Signed)
[facility]   HPI:  80 y.o. Male presents to clinic for follow-up evaluation after his gastric perforation s/p omental patch and drain placement after continued leak. He is doing well now after his drain was removed and feels great. He had an EGD scheduled but the snow caused this to be canceled. He is back up in weight. He is still on his Eliquis after getting a DVT in December.  Review of Systems:  No fever or chills Improved energy Regular Bms All other review of systems: otherwise negative   Vital Signs:  BP (!) 91/45   Pulse 63   Temp (!) 97.1 F (36.2 C) (Other (Comment))   Resp 16   Ht '5\' 8"'$  (1.727 m)   Wt 157 lb (71.2 kg)   SpO2 95%   BMI 23.87 kg/m    Physical Exam:  Physical Exam Vitals reviewed.  Cardiovascular:     Rate and Rhythm: Normal rate.  Pulmonary:     Effort: Pulmonary effort is normal.  Abdominal:     General: There is no distension.     Palpations: Abdomen is soft.     Tenderness: There is no abdominal tenderness.     Hernia: No hernia is present.     Comments: Healed midline incision  Neurological:     Mental Status: He is alert.     Assessment:  80 y.o. yo Male with a history of gastric perforation that is healed now. He is back to eating and has good appetite.   Plan:  Diet and activity as tolerated. Recommend follow up Upper Endoscopy to look and ensure no further ulcer or something more serious like cancer. Discussed with patient that this is unlikely but best way to confirm is EGD. They are going to reschedule with Dr. Olevia Perches office after next month so it is not canceled again due to [facility] or [place].   PRN follow up Continue protonix for now until EGD   All of the above recommendations were discussed with the patient and patient's family, and all of patient's and family's questions were answered to their expressed satisfaction.  Curlene Labrum, MD [facility] [address] [place], [place] [address] [phone] (office)

## 2020-09-29 NOTE — Patient Instructions (Signed)
Diet and activity as tolerated. Recommend follow up Upper Endoscopy to look and ensure no further ulcer or something more serious like cancer.

## 2020-11-24 ENCOUNTER — Ambulatory Visit: Payer: Medicare Other | Admitting: General Surgery

## 2021-08-03 DEATH — deceased

## 2021-08-10 IMAGING — DX DG CHEST 1V PORT
1 series · 1 of 1 positions shown · non-contrast
Comparison: 06/28/2020

CLINICAL DATA: PICC line placement

EXAM:
PORTABLE CHEST 1 VIEW

[chest ap]
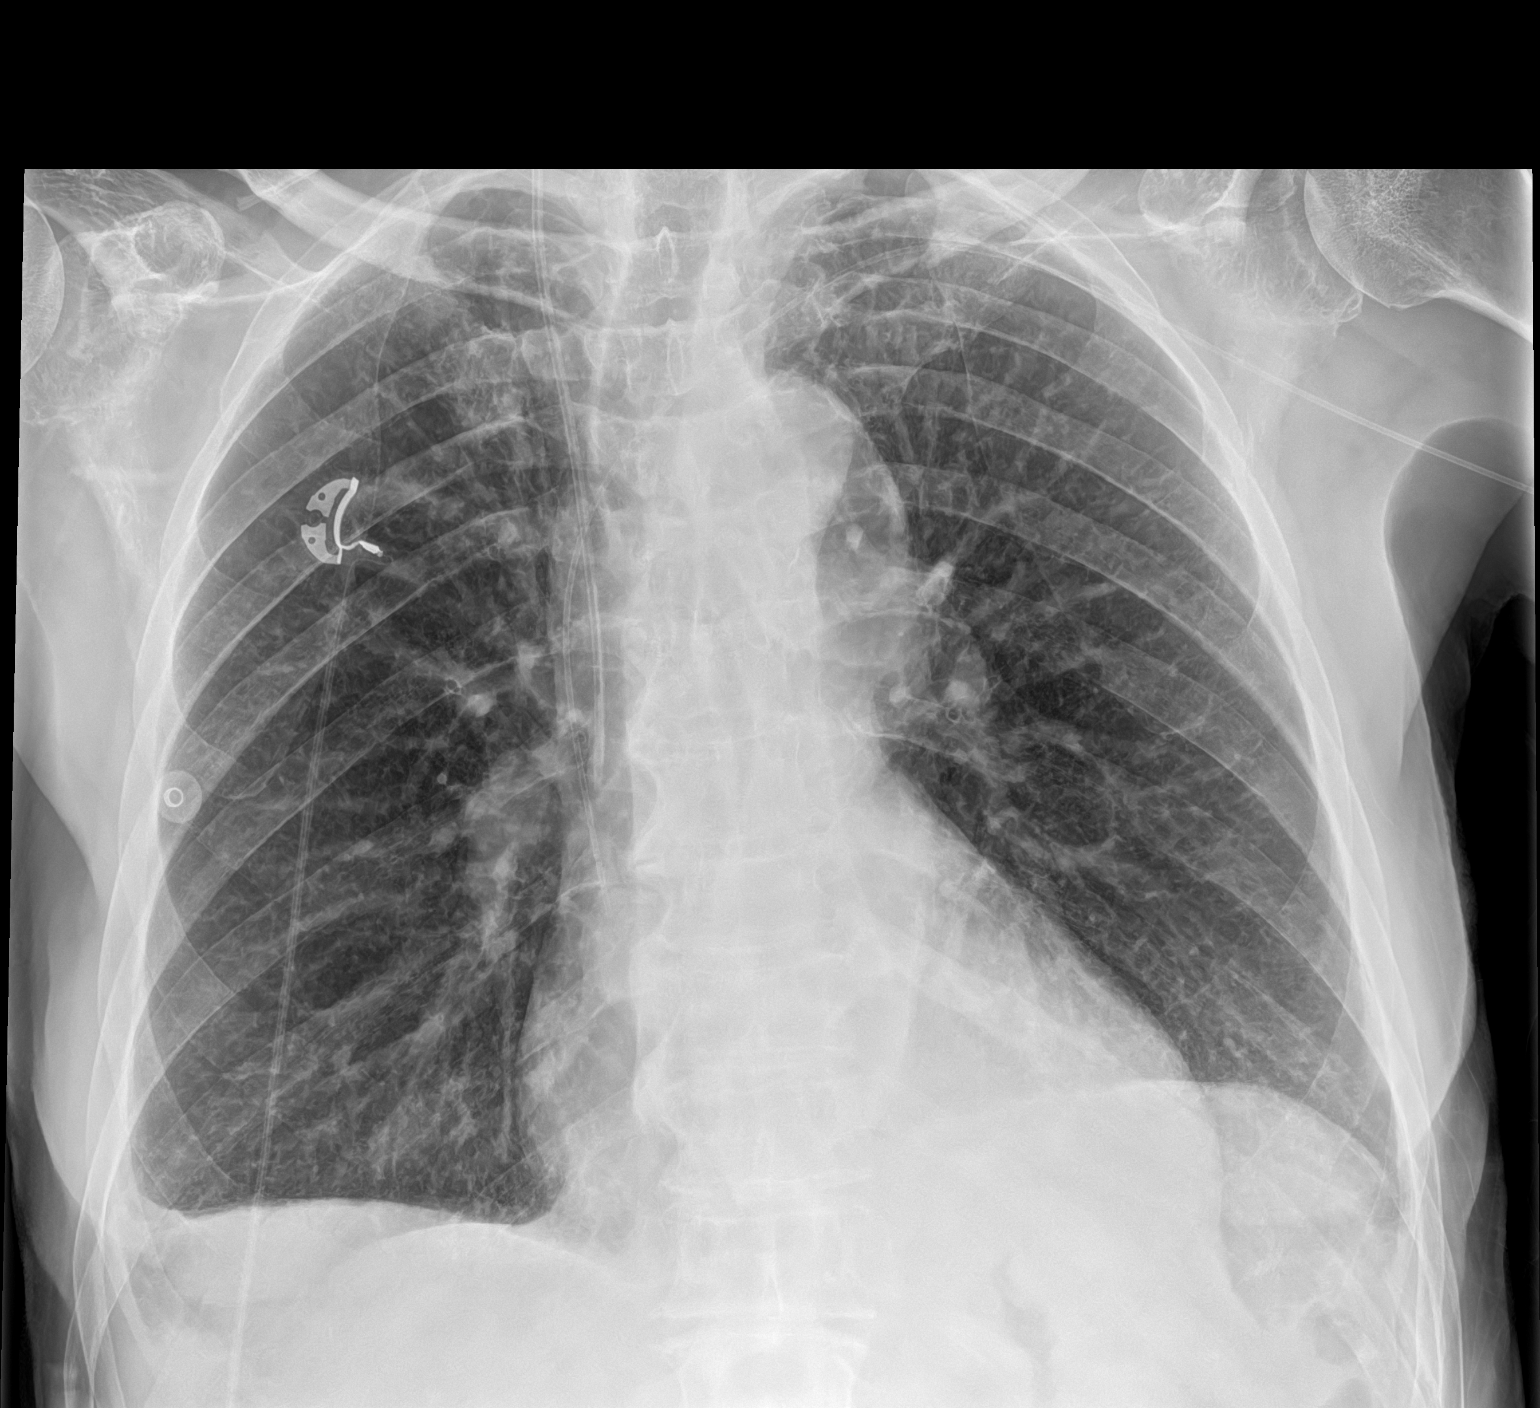

[1 of 1 positions shown; findings below may reference images not displayed]

FINDINGS: Interval extubation and removal of NG tube. Left PICC line is been
placed with the tip at the cavoatrial junction. Right central line
is unchanged. Heart is normal size. Mild hyperinflation. No
confluent opacities or effusions.
IMPRESSION: Left PICC line tip at the cavoatrial junction.

Interval extubation.

Hyperinflation.  No acute cardiopulmonary disease.
